# Patient Record
Sex: Female | Born: 1975
Health system: Southern US, Community
[De-identification: ages and names within clinical notes are randomized; demographics above are authoritative.]

## PROBLEM LIST (undated history)

## (undated) DIAGNOSIS — K2970 Gastritis, unspecified, without bleeding: Secondary | ICD-10-CM

## (undated) DIAGNOSIS — F32A Depression, unspecified: Secondary | ICD-10-CM

## (undated) DIAGNOSIS — R87619 Unspecified abnormal cytological findings in specimens from cervix uteri: Secondary | ICD-10-CM

## (undated) DIAGNOSIS — Z9889 Other specified postprocedural states: Secondary | ICD-10-CM

## (undated) DIAGNOSIS — I499 Cardiac arrhythmia, unspecified: Secondary | ICD-10-CM

## (undated) DIAGNOSIS — J189 Pneumonia, unspecified organism: Secondary | ICD-10-CM

## (undated) DIAGNOSIS — K219 Gastro-esophageal reflux disease without esophagitis: Secondary | ICD-10-CM

## (undated) DIAGNOSIS — T8859XA Other complications of anesthesia, initial encounter: Secondary | ICD-10-CM

## (undated) DIAGNOSIS — R112 Nausea with vomiting, unspecified: Secondary | ICD-10-CM

## (undated) DIAGNOSIS — D649 Anemia, unspecified: Secondary | ICD-10-CM

## (undated) DIAGNOSIS — F988 Other specified behavioral and emotional disorders with onset usually occurring in childhood and adolescence: Secondary | ICD-10-CM

## (undated) DIAGNOSIS — N920 Excessive and frequent menstruation with regular cycle: Secondary | ICD-10-CM

## (undated) DIAGNOSIS — R000 Tachycardia, unspecified: Secondary | ICD-10-CM

## (undated) DIAGNOSIS — F419 Anxiety disorder, unspecified: Secondary | ICD-10-CM

## (undated) DIAGNOSIS — IMO0002 Reserved for concepts with insufficient information to code with codable children: Secondary | ICD-10-CM

## (undated) HISTORY — DX: Other specified behavioral and emotional disorders with onset usually occurring in childhood and adolescence: F98.8

## (undated) HISTORY — DX: Depression, unspecified: F32.A

## (undated) HISTORY — DX: Gastritis, unspecified, without bleeding: K29.70

## (undated) HISTORY — PX: AUGMENTATION MAMMAPLASTY: SUR837

## (undated) HISTORY — DX: Excessive and frequent menstruation with regular cycle: N92.0

## (undated) HISTORY — PX: CERVICAL ABLATION: SHX5771

## (undated) HISTORY — PX: BREAST REDUCTION SURGERY: SHX8

## (undated) HISTORY — PX: OTHER SURGICAL HISTORY: SHX169

## (undated) HISTORY — PX: KNEE SURGERY: SHX244

## (undated) HISTORY — DX: Gastro-esophageal reflux disease without esophagitis: K21.9

## (undated) HISTORY — DX: Unspecified abnormal cytological findings in specimens from cervix uteri: R87.619

## (undated) HISTORY — PX: REDUCTION MAMMAPLASTY: SUR839

## (undated) HISTORY — DX: Anxiety disorder, unspecified: F41.9

## (undated) HISTORY — DX: Reserved for concepts with insufficient information to code with codable children: IMO0002

## (undated) HISTORY — DX: Tachycardia, unspecified: R00.0

## (undated) HISTORY — PX: DILATION AND CURETTAGE OF UTERUS: SHX78

## (undated) HISTORY — PX: TONSILLECTOMY AND ADENOIDECTOMY: SHX28

---

## 1999-10-08 ENCOUNTER — Other Ambulatory Visit: Admission: RE | Admit: 1999-10-08 | Discharge: 1999-10-08 | Payer: Self-pay | Admitting: *Deleted

## 2000-10-14 ENCOUNTER — Other Ambulatory Visit: Admission: RE | Admit: 2000-10-14 | Discharge: 2000-10-14 | Payer: Self-pay | Admitting: Orthopedic Surgery

## 2000-10-21 ENCOUNTER — Other Ambulatory Visit: Admission: RE | Admit: 2000-10-21 | Discharge: 2000-10-21 | Payer: Self-pay | Admitting: Obstetrics and Gynecology

## 2000-12-15 ENCOUNTER — Emergency Department (HOSPITAL_COMMUNITY): Admission: EM | Admit: 2000-12-15 | Discharge: 2000-12-15 | Payer: Self-pay | Admitting: *Deleted

## 2000-12-17 ENCOUNTER — Encounter: Payer: Self-pay | Admitting: Emergency Medicine

## 2000-12-17 ENCOUNTER — Emergency Department (HOSPITAL_COMMUNITY): Admission: EM | Admit: 2000-12-17 | Discharge: 2000-12-17 | Payer: Self-pay | Admitting: Emergency Medicine

## 2001-11-27 ENCOUNTER — Other Ambulatory Visit: Admission: RE | Admit: 2001-11-27 | Discharge: 2001-11-27 | Payer: Self-pay | Admitting: Obstetrics and Gynecology

## 2002-05-04 ENCOUNTER — Inpatient Hospital Stay (HOSPITAL_COMMUNITY): Admission: AD | Admit: 2002-05-04 | Discharge: 2002-05-04 | Payer: Self-pay | Admitting: Obstetrics and Gynecology

## 2002-06-02 ENCOUNTER — Inpatient Hospital Stay (HOSPITAL_COMMUNITY): Admission: AD | Admit: 2002-06-02 | Discharge: 2002-06-02 | Payer: Self-pay | Admitting: Obstetrics and Gynecology

## 2002-06-03 ENCOUNTER — Inpatient Hospital Stay (HOSPITAL_COMMUNITY): Admission: AD | Admit: 2002-06-03 | Discharge: 2002-06-03 | Payer: Self-pay | Admitting: Obstetrics and Gynecology

## 2002-07-02 ENCOUNTER — Inpatient Hospital Stay (HOSPITAL_COMMUNITY): Admission: AD | Admit: 2002-07-02 | Discharge: 2002-07-02 | Payer: Self-pay | Admitting: Obstetrics and Gynecology

## 2002-07-20 ENCOUNTER — Inpatient Hospital Stay (HOSPITAL_COMMUNITY): Admission: AD | Admit: 2002-07-20 | Discharge: 2002-07-20 | Payer: Self-pay | Admitting: Obstetrics and Gynecology

## 2002-07-27 ENCOUNTER — Inpatient Hospital Stay (HOSPITAL_COMMUNITY): Admission: AD | Admit: 2002-07-27 | Discharge: 2002-07-27 | Payer: Self-pay | Admitting: Obstetrics and Gynecology

## 2002-07-27 ENCOUNTER — Inpatient Hospital Stay (HOSPITAL_COMMUNITY): Admission: AD | Admit: 2002-07-27 | Discharge: 2002-07-30 | Payer: Self-pay | Admitting: Obstetrics and Gynecology

## 2002-10-16 ENCOUNTER — Encounter: Payer: Self-pay | Admitting: Urology

## 2002-10-16 ENCOUNTER — Encounter: Admission: RE | Admit: 2002-10-16 | Discharge: 2002-10-16 | Payer: Self-pay | Admitting: Urology

## 2002-12-13 ENCOUNTER — Other Ambulatory Visit: Admission: RE | Admit: 2002-12-13 | Discharge: 2002-12-13 | Payer: Self-pay | Admitting: Obstetrics and Gynecology

## 2003-12-20 ENCOUNTER — Other Ambulatory Visit: Admission: RE | Admit: 2003-12-20 | Discharge: 2003-12-20 | Payer: Self-pay | Admitting: Obstetrics and Gynecology

## 2004-03-16 ENCOUNTER — Inpatient Hospital Stay (HOSPITAL_COMMUNITY): Admission: AD | Admit: 2004-03-16 | Discharge: 2004-03-16 | Payer: Self-pay | Admitting: Obstetrics and Gynecology

## 2004-04-07 ENCOUNTER — Observation Stay (HOSPITAL_COMMUNITY): Admission: AD | Admit: 2004-04-07 | Discharge: 2004-04-08 | Payer: Self-pay | Admitting: Obstetrics and Gynecology

## 2004-08-04 ENCOUNTER — Inpatient Hospital Stay (HOSPITAL_COMMUNITY): Admission: AD | Admit: 2004-08-04 | Discharge: 2004-08-04 | Payer: Self-pay | Admitting: Obstetrics and Gynecology

## 2004-08-31 ENCOUNTER — Ambulatory Visit (HOSPITAL_COMMUNITY): Admission: RE | Admit: 2004-08-31 | Discharge: 2004-08-31 | Payer: Self-pay | Admitting: Obstetrics and Gynecology

## 2004-09-03 ENCOUNTER — Inpatient Hospital Stay (HOSPITAL_COMMUNITY): Admission: AD | Admit: 2004-09-03 | Discharge: 2004-09-04 | Payer: Self-pay | Admitting: Obstetrics and Gynecology

## 2004-09-05 ENCOUNTER — Inpatient Hospital Stay (HOSPITAL_COMMUNITY): Admission: AD | Admit: 2004-09-05 | Discharge: 2004-09-05 | Payer: Self-pay | Admitting: Obstetrics and Gynecology

## 2004-09-15 ENCOUNTER — Ambulatory Visit (HOSPITAL_COMMUNITY): Admission: RE | Admit: 2004-09-15 | Discharge: 2004-09-15 | Payer: Self-pay | Admitting: Obstetrics and Gynecology

## 2004-09-28 ENCOUNTER — Observation Stay (HOSPITAL_COMMUNITY): Admission: AD | Admit: 2004-09-28 | Discharge: 2004-09-29 | Payer: Self-pay | Admitting: Obstetrics and Gynecology

## 2004-10-02 ENCOUNTER — Inpatient Hospital Stay (HOSPITAL_COMMUNITY): Admission: AD | Admit: 2004-10-02 | Discharge: 2004-10-02 | Payer: Self-pay | Admitting: Obstetrics and Gynecology

## 2004-10-09 ENCOUNTER — Inpatient Hospital Stay (HOSPITAL_COMMUNITY): Admission: AD | Admit: 2004-10-09 | Discharge: 2004-10-11 | Payer: Self-pay | Admitting: Obstetrics and Gynecology

## 2005-02-17 ENCOUNTER — Other Ambulatory Visit: Admission: RE | Admit: 2005-02-17 | Discharge: 2005-02-17 | Payer: Self-pay | Admitting: Obstetrics and Gynecology

## 2006-03-14 ENCOUNTER — Other Ambulatory Visit: Admission: RE | Admit: 2006-03-14 | Discharge: 2006-03-14 | Payer: Self-pay | Admitting: Obstetrics and Gynecology

## 2007-05-01 ENCOUNTER — Ambulatory Visit (HOSPITAL_COMMUNITY): Admission: RE | Admit: 2007-05-01 | Discharge: 2007-05-01 | Payer: Self-pay | Admitting: Obstetrics and Gynecology

## 2007-08-04 ENCOUNTER — Inpatient Hospital Stay (HOSPITAL_COMMUNITY): Admission: AD | Admit: 2007-08-04 | Discharge: 2007-08-04 | Payer: Self-pay | Admitting: Obstetrics and Gynecology

## 2007-08-22 ENCOUNTER — Inpatient Hospital Stay (HOSPITAL_COMMUNITY): Admission: AD | Admit: 2007-08-22 | Discharge: 2007-08-23 | Payer: Self-pay | Admitting: Obstetrics and Gynecology

## 2007-09-20 ENCOUNTER — Ambulatory Visit (HOSPITAL_COMMUNITY): Admission: RE | Admit: 2007-09-20 | Discharge: 2007-09-20 | Payer: Self-pay | Admitting: Obstetrics and Gynecology

## 2007-11-19 ENCOUNTER — Inpatient Hospital Stay (HOSPITAL_COMMUNITY): Admission: AD | Admit: 2007-11-19 | Discharge: 2007-11-19 | Payer: Self-pay | Admitting: Obstetrics and Gynecology

## 2007-11-23 ENCOUNTER — Inpatient Hospital Stay (HOSPITAL_COMMUNITY): Admission: AD | Admit: 2007-11-23 | Discharge: 2007-11-23 | Payer: Self-pay | Admitting: Obstetrics and Gynecology

## 2008-01-08 ENCOUNTER — Inpatient Hospital Stay (HOSPITAL_COMMUNITY): Admission: AD | Admit: 2008-01-08 | Discharge: 2008-01-08 | Payer: Self-pay | Admitting: Obstetrics and Gynecology

## 2008-02-12 ENCOUNTER — Inpatient Hospital Stay (HOSPITAL_COMMUNITY): Admission: RE | Admit: 2008-02-12 | Discharge: 2008-02-14 | Payer: Self-pay | Admitting: Obstetrics and Gynecology

## 2008-10-02 ENCOUNTER — Ambulatory Visit: Payer: Self-pay | Admitting: Psychology

## 2008-10-03 ENCOUNTER — Ambulatory Visit: Payer: Self-pay | Admitting: Psychology

## 2008-10-07 ENCOUNTER — Ambulatory Visit: Payer: Self-pay | Admitting: Psychology

## 2008-10-14 ENCOUNTER — Ambulatory Visit: Payer: Self-pay | Admitting: Psychology

## 2008-10-23 ENCOUNTER — Ambulatory Visit: Payer: Self-pay | Admitting: Psychology

## 2008-10-30 ENCOUNTER — Ambulatory Visit: Payer: Self-pay | Admitting: Psychology

## 2008-11-08 ENCOUNTER — Ambulatory Visit: Payer: Self-pay | Admitting: Psychology

## 2008-11-26 ENCOUNTER — Ambulatory Visit: Payer: Self-pay | Admitting: Psychology

## 2009-06-24 ENCOUNTER — Encounter: Admission: RE | Admit: 2009-06-24 | Discharge: 2009-06-24 | Payer: Self-pay | Admitting: Chiropractic Medicine

## 2010-05-09 ENCOUNTER — Emergency Department (HOSPITAL_BASED_OUTPATIENT_CLINIC_OR_DEPARTMENT_OTHER): Admission: EM | Admit: 2010-05-09 | Discharge: 2010-05-09 | Payer: Self-pay | Admitting: Emergency Medicine

## 2010-11-16 ENCOUNTER — Encounter: Payer: Self-pay | Admitting: Chiropractic Medicine

## 2011-03-09 NOTE — H&P (Signed)
NAMEJAVIA, Lindsay Giles             ACCOUNT NO.:  [id number]   MEDICAL RECORD NO.:  [id number]          PATIENT TYPE:  INP   LOCATION:  9173                          [facility]ILITY:  [facility]   PHYSICIAN:  Crist Fat. Rivard, M.D. DATE OF BIRTH:  1976-07-28   DATE OF ADMISSION:  02/12/2008  DATE OF DISCHARGE:                              HISTORY & PHYSICAL   Lindsay Giles is a 35 year old gravida 4, para 2-0-1-2 at 40 weeks who  presents for induction secondary to a favorable cervix.  The cervix has  been 4 cm in the office.  The pregnancy has been remarkable for:   1. History of a subchorionic hemorrhage noted early in pregnancy with      a separation of the amnion.  This was noted until approximately 27      weeks.  It had essentially stabilized at approximately 8 cm in      diameter.  2. Rh negative with patient receiving RhoGAM during her pregnancy.  3. First child with type 1 diabetes.  4. Left fetal renal pyelectasis.  5. Early lower uterine segment development noted at 25 weeks with      patient on Procardia during her pregnancy.  6. Group B strep negative.  7. History of CHARGE disease.  8. The patient desires tubal only if a cesarean section as needed.   PRENATAL LABS:  Blood type is A negative, Rh antibody negative, VRE  nonreactive, rubella titer positive, hepatitis B surface antigen  negative, HIV was nonreactive.  GC and Chlamydia cultures were negative  in early pregnancy.  Her Pap was normal in June 2008.  Cystic fibrosis  testing was negative.  Hemoglobin upon entering the practice was 12.9.  It was within normal limits at 28 weeks.  The patient's Glucola was  normal.  Group B strep culture was negative at 36 weeks.  The patient  also had a negative fetal fibronectin during her history.   HISTORY OF PRESENT PREGNANCY:  The patient entered care at approximately  8 weeks and 5 days.  She initially was undecided regarding genetic  screening.  She did have nausea and vomiting and  some bright red  bleeding at approximately 9-10 weeks.  There was a large subchorionic  hemorrhage noted retroplacental to the inferior margin of 5.6 x 1.8 x  5.9 cm.  The patient did receive RhoGAM at that time.  That bleeding  issue did resolve.  At 13 weeks, she was having some cramping.  She had  an ultrasound for viability at approximately 13-1/2 weeks, and then had  a nuchal translucency that was within normal limits.  The retroplacental  hemorrhage at that time at 13 weeks and 3 days was 8 x 1.6 x 6 cm.  She  was continued on pelvic rest.  AST and HIV were done during her  pregnancy and were normal.  The patient had an ultrasound at [facility] at 15 weeks.  The cervix was normal and there was noted to be a  moderate subchorionic hemorrhage of 5.4 x 1.6 x 7 cm.  All other  findings were normal.  There was some separation of the amnio seen and  at 16 weeks, the clot remained at 7.42 cm.  She had a full ultrasound  for anatomy at 18 weeks with no change in the clot.  The cervix was 3.05  cm.  There was bilateral fetal renal pyelectasis noted.  She had some  cramping at 21 weeks.  She was placed on Motrin.  GC  and Chlamydia  cultures have been negative.  In December, she was started on Procardia  at approximately 22 weeks for cramping.  Ultrasound at that time showed  normal cervical length, left pyelectasis was only noted which was  borderline for gestational day.  Subchorionic hemorrhage was stable at 8  cm.  Fetal fibronectin was negative at 25 weeks.  She had not used  Procardia from her last visit, but it was recommended that she start it  that time.  There was a long and closed -2, vertex cervix with a well-  developed lower uterine segment.  Another ultrasound was done at 27  weeks showing growth at the 85th percentile.  The placental subchorionic  hemorrhage was 6.4 cm with persistent amnio separation.  Left  pyelectasis was resolved at that time.  She did have pneumonia  at that  time as well.  Fetal fibronectin was done and was negative.  At 29  weeks, she remained on Procardia.  Fetal fibronectin was negative.  The  lower uterine segment was still well-developed.  At 31 weeks, she had  another ultrasound that showed average growth, normal fluid.  The  patient at that time was discussing the possibility of a tubal; however,  she has subsequently decided this will be done if she were to have a C-  section.  Fetal fibronectin was done again at 33 weeks.  At 35 weeks,  she presented for uterine contractions.  Cervix at that time was 2, 50%,  -1.  She was sent to the MAU for further monitoring.  Group B strep  culture had been done and was negative.  HIV was also done and was  negative.  This had not been done on first visit.  Ultrasound at 37  weeks showed growth of 7 pounds 4 ounces, 75th percentile level, normal  fluid.  There was still left pyelectasis noted and cervix at that time  was 3, 50% vertex at a -1.  The patient reports that on her last exam  cervix was 4 and 80%   OBSTETRICAL HISTORY:  In 1996, with a first trimester TAB, she did  receive RhoGAM.  In 2004, she had a vaginal birth of a female infant at 4  weeks.  She was in labor 24 hours.  She had epidural anesthesia.  That  child was diagnosed with juvenile diabetes at age 8.  In 2005, she had a  vaginal birth of a female infant, weight 6 pounds 4 ounces at 37-4/7  weeks.  She was in labor 4 hours.  She had epidural anesthesia.  She did  have some preterm contractions during that pregnancy, but no cervical  change.   MEDICAL HISTORY:  In 1976, she had an abnormal Pap, but has been normal  since.  She has been a previous oral contraceptive user.   SURGICAL HISTORY:  Includes tonsillectomy and adenoidectomy in the past.  Right hand neuroma removed.  Wisdom teeth removed and the previously  noted TAB.   ALLERGIES:  ATROVENT WHICH CAUSED A NOSE BLEED.   The patient also had one  episode of  cystitis and pyelo in 1998.   FAMILY HISTORY:  Paternal grandfather had heart disease and chronic  hypertension.  Paternal grandfather also had anemia.  Paternal  grandmother had brain cancer.  Genetic history is remarkable for a niece  with CHARGE syndrome which causes developmental delay, walking problems,  hand, blind in one eye, deaf in one ear.  This had a negative genetic  evaluation.  The patient's father is also a twin.   SOCIAL HISTORY:  The patient is married to the father of the baby.  He  is involved and supportive.  His name is Wanisha Shiroma.  The patient has  some college.  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  Her husband has a Systems analyst.  He is employed at [employer].  She has  been followed by the physician [facility] at [facility].  She  denies any alcohol, drug or tobacco use during this pregnancy.   PHYSICAL EXAMINATION:  VITAL SIGNS: Stable.  The patient is febrile.  HEENT:  Within normal limits.  LUNGS:  Breath sounds are clear.  HEART:  Regular rate and rhythm without murmur.  BREASTS:  Soft and nontender.  ABDOMEN:  Fundal height is approximately 39 cm.  Estimated fetal weight  is 7 to 7-1/2 pounds.  Uterine contractions are irregular and mild.  Fetal heart rate is in the 150-160 range, although accelerations are  noted.  No decelerations were noted.  PELVIC:  Deferred at present.  EXTREMITIES:  Deep tendon reflexes are 2+ without clonus.  There is  trace edema noted.   IMPRESSION:  1. Intrauterine pregnancy at 40 weeks.  2. Favorable cervix.  3. History of persistent subchorionic hemorrhage.  4. Rh negative.  5. Left fetal renal pyelectasis.   PLAN:  1. Admit to birthing suite with consult with Dr. Silverio Lay as      attending physician.  2. Routine physician orders.  3. Patient plans private storage of cord blood and placenta  4. The patient desires artificial rupture of membranes as initial      measure for induction.  The nurse  will inform Dr. Estanislado Pandy of the      patient's choice and preference.      Renaldo Reel Emilee Hero, C.N.M.      Crist Fat Rivard, M.D.  Electronically Signed    VLL/MEDQ  D:  02/12/2008  T:  02/12/2008  Job:  [id number]

## 2011-03-12 NOTE — Discharge Summary (Signed)
[name], Lindsay Giles                       ACCOUNT NO.:  [id number]   MEDICAL RECORD NO.:  [id number]                   PATIENT TYPE:  OBV   LOCATION:  9312                                 [facility]   PHYSICIAN:  Crist Fat. Rivard, M.D.              DATE OF BIRTH:  01-27-1976   DATE OF ADMISSION:  04/07/2004  DATE OF DISCHARGE:  04/08/2004                                 DISCHARGE SUMMARY   ADMITTING DIAGNOSES:  1. Intrauterine pregnancy at [redacted] weeks gestation.  2. History hyperemesis with sinusitis and increased nausea and vomiting.   DISCHARGE DIAGNOSES:  1. Intrauterine pregnancy at [redacted] weeks gestation.  2. History hyperemesis with sinusitis and increased nausea and vomiting.  3. Nausea and vomiting improving.   Lindsay Giles is a 35 year old gravida 2 para 1-0-0-1 who presents at [redacted] weeks  gestation by LMP and early ultrasound.  She has had nausea and vomiting with  hyperemesis throughout this early pregnancy.  However, her nausea and  vomiting had been improving.  However, with occurrence of sinus infection  her nausea and vomiting returned and she was unable to keep down any food or  fluids  She is therefore admitted for 23-hour observation for IV hydration,  antiemetics, and antibiotics.  She has remained afebrile and responded well  to IV fluid hydration, antiemetics, and IV antibiotic with improvement in  nausea and vomiting.  She is therefore to be discharged home.  She is  tolerating p.o. fluids.   PLAN:  Per Dr. Estanislado Pandy:  1. Discharge home.  2. Continue Zofran and Phenergan p.r.n.  3. She is to follow up as scheduled at the office of [facility].  4. She was given a prescription Z-Pak for her sinusitis.  5. She will call the office for any further nausea or vomiting or any     problems or concerns.     Lindsay Giles, C.N.M.               Crist Fat Rivard, M.D.    SDM/MEDQ  D:  04/08/2004  T:  04/08/2004  Job:  [id number]

## 2011-03-12 NOTE — H&P (Signed)
NAME:  Lindsay Giles, APPLE                       ACCOUNT NO.:  [id number]   MEDICAL RECORD NO.:  [id number]                   PATIENT TYPE:  INP   LOCATION:  9312                                 [facility]   PHYSICIAN:  Janine Limbo, M.D.            DATE OF BIRTH:  11-Feb-1976   DATE OF ADMISSION:  04/07/2004  DATE OF DISCHARGE:                                HISTORY & PHYSICAL   HISTORY OF PRESENT ILLNESS:  The patient is a 35 year old gravida 2, para 1-  0-0-1, at 11 weeks by LMP and early ultrasound, who presents today with  recurrence of nausea and vomiting and possible sinus infection.  She has had  issues with nausea and vomiting during this pregnancy, but was improving.  However, today was exposed to her son who has a sinus infection and she  began to have symptoms of sinus pain and pressure, low grade fever, flu-like  symptoms subsequent to her exposure to him. She was on Zofran at home, but  that did not control the nausea and vomiting.  She has been unable to keep  any food or fluids down today.  She does report positive sinus pain, flu-  like symptoms, and mild sore throat.  She denies any cough or nasal  drainage, and she is having no abdominal cramping, pelvic pressure, vaginal  discharge, leaking, bleeding, or any other problems.  Pregnancy has been  remarkable for:  1) History of nausea and vomiting in this pregnancy and her  first pregnancy.  2) History of preterm labor last pregnancy, but term  delivery.   LABORATORY DATA:  Clean catch urine tonight shows specific gravity of 1.020  and 15 of ketones.   PAST OBSTETRICAL HISTORY:  The patient has had one spontaneous vaginal  delivery.  She did have preterm labor with that pregnancy, but had a term  delivery that was within the last two years.   PAST MEDICAL HISTORY:  She denies any hypertension, diabetes, or any other  problems.   PAST SURGICAL HISTORY:  Tonsils removed and surgery on her right hand in the  past.   ALLERGIES:  ATROVENT NASAL SPRAY which causes bleeding.   CURRENT MEDICATIONS:  1. Zofran which was last taken at 8 p.m. tonight.  2. Tylenol PM which was taken last night at bedtime.   SOCIAL HISTORY:  The patient is a nonsmoker. She denies any alcohol, drug,  or tobacco use during this pregnancy.  The patient is married to the father  of the baby. He is involved and supportive.  His name is Haisley Arens.  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   PHYSICAL EXAMINATION:  VITAL SIGNS:  Temperature is 99, pulse 126,  respirations 24, blood pressure 115/67.  Fetal heart tones are 170.  ABDOMEN:  Soft and nontender with an approximately 11-week fundal height.  CHEST:  Clear with bilateral breath sounds clear.  HEART:  Regular rate  and rhythm without murmur, but tachycardia is noted.  Negative CVA tenderness is noted.  HEENT:  Ears; right drum is slightly full with a small amount of yellow  exudate noted.  Left drum is clear.  Nasal turbinates are boggy and red.  There is positive upper and lower sinus tenderness.  Throat is very slightly  red. Tonsils are noted to be absent.  EXTREMITIES:  Deep tendon reflexes are 2+ without clonus. There is no edema  noted.   IMPRESSION:  1. Intrauterine pregnancy at 11 weeks.  2. Sinusitis with superimposed nausea and vomiting.   PLAN:  Admit to [facility] for 23-hour observation per consult with  Dr. Stefano Gaul.  IV hydration through the night.  Zofran IV 8 mg IV q.8h.  Ancef 1 gram IV q.8h.  Continue to observe and reevaluate in the morning.     Renaldo Reel Emilee Hero, C.N.M.                   Janine Limbo, M.D.    VLL/MEDQ  D:  04/07/2004  T:  04/07/2004  Job:  [id number]

## 2011-03-12 NOTE — H&P (Signed)
NAME:  Lindsay Giles, Lindsay Giles                ACCOUNT NO.:  [id number]   MEDICAL RECORD NO.:  [id number]                   PATIENT TYPE:  MAT   LOCATION:  MATC                                 [facility]   PHYSICIAN:  Crist Fat. Rivard, M.D.              DATE OF BIRTH:  Nov 27, 1975   DATE OF ADMISSION:  07/27/2002  DATE OF DISCHARGE:                                HISTORY & PHYSICAL   HISTORY OF PRESENT ILLNESS:  The patient is a 35 year old married white  female gravida 2 para 0-0-1-0 at [redacted] weeks gestation who presents complaining  of uterine contractions all night long every four to seven  minutes.  She  was evaluated earlier in the night and was found to be 2 cm at that time and  she did not continue to change her cervix, and went home with some Ambien.  She returned to the office later on and was found to be 2.5 cm still with  irregular but strong contractions.  She now complains of contractions that  are every four to five minutes that are increasing in intensity.  She denies  any leaking or bleeding.  She denies any nausea, vomiting, headaches, or  visual disturbances.  Her pregnancy has been followed at [facility] by the M.D. service and has been essentially uncomplicated though at  risk for:  1. Rh negative.  2. First trimester spotting.   Her group B strep is negative.   OBSTETRICAL/GYNECOLOGICAL HISTORY:  She is a gravida 2 para 0-0-1-0, had an  elective AB in 1996 with no complications.  She did receive RhoGAM with that  procedure.  She reports that she was on oral contraceptives until two years  ago.   ALLERGIES:  ATROVENT nasal spray.   GENERAL MEDICAL HISTORY:  She reports having had the usual childhood  diseases.  She has had an occasional urinary tract infection and had  pyelonephritis in 1998.  Her only surgery was tonsillectomy and right hand  surgery - had a neuroma removed, wisdom teeth, and D&C.   FAMILY HISTORY:  Significant for paternal  grandfather with congestive heart  failure, paternal grandmother with pacemaker, the same people with  hypertension, and paternal grandfather with anemia.  Her paternal  grandfather also had partial kidney failure and maternal grandmother has had  a brain tumor.   GENETIC HISTORY:  Negative.   SOCIAL HISTORY:  She is married to [employer] who is involved and  supportive.  They are both employed full-time.  They are of the  nondenominational Christian faith.  They deny any illicit drug use, alcohol,  or smoking with this pregnancy.   PRENATAL LABORATORY DATA:  Blood type A negative, antibody screen is  negative.  Syphilis nonreactive.  Rubella immune.  Hepatitis B surface  antigen negative.  HIV nonreactive.  GC and chlamydia are both negative.  Pap is within normal limits.  Her one-hour Glucola is 76.  She declined  maternal  serum alpha-fetoprotein.  Her group B strep is negative.   PHYSICAL EXAMINATION:  VITAL SIGNS:  Stable; she is afebrile.  HEENT:  Grossly within normal limits.  HEART:  Regular rhythm and rate.  CHEST:  Clear.  BREASTS:  Soft and nontender.  ABDOMEN:  Gravid with uterine contractions every four to five minutes.  Her  fetal heart rate is reactive and reassuring.  PELVIC:  Her cervix is now 3 cm, 90%, vertex, at a 0 station per Dr. Dois Davenport  Rivard.  EXTREMITIES:  Within normal limits.   ASSESSMENT:  1. Intrauterine pregnancy at term.  2. Early labor.  3. Negative group B strep.  4. Rh negative.   PLAN:  1. Admit to labor and delivery per Dr. Estanislado Pandy.  2. Follow routine M.D. orders.     Concha Pyo. Duplantis, C.N.M.              Crist Fat Rivard, M.D.    SJD/MEDQ  D:  07/27/2002  T:  07/27/2002  Job:  [id number]

## 2011-03-12 NOTE — H&P (Signed)
NAME:  Lindsay Giles, Lindsay Giles                 ACCOUNT NO.:  r   MEDICAL RECORD NO.:  [id number]          PATIENT TYPE:  INP   LOCATION:  9168                          [facility]   PHYSICIAN:  Hal Morales, M.D.DATE OF BIRTH:  11/07/1975   DATE OF ADMISSION:  09/28/2004  DATE OF DISCHARGE:                                HISTORY & PHYSICAL   HISTORY OF PRESENT ILLNESS:  Lindsay Giles is a 35 year old gravida 3, para 1-0-  1-1, at 91 weeks' gestation who was seen in the office earlier today  complaining of uterine contractions every three minutes that were increasing  in intensity and was at that point found to be 2 cm, 70%, and -1 station.  She was asked to ambulate and then be reevaluated at [facility]  subsequently.  She denies any leaking or vaginal bleeding.  She denies any  nausea, vomiting, headaches, or visual disturbances.  She rates her pain  with the contractions of 6/10 and is not currently  wanting an epidural for pain, but plans to have an epidural for pain as her  labor progresses.  Her pregnancy has been followed at [facility] by the M.D. service and has been essentially uncomplicated, though at  risk for history of Rh negative and questionable.  Her Group B st reo was  just collected today.   OB/GYN HISTORY:  Her LMP was unknown.  Her EDC was established by ultrasound  and October 26, 2004.  She is a gravida 3, para 1-0-1-1 who had an elective  AB in 1996 without complications and delivered a viable female infant in  October of 2004 who weighed 7 pounds 1 ounce at 38 weeks' gestation  following a 24 hour labor with an epidural and attended in delivery by Dr.  Dois Davenport Rivard.  She had preterm uterine contractions with her last pregnancy  but took terbutaline and Procardia and never had any cervical change.   PAST MEDICAL HISTORY:  She reports having had the usual childhood diseases.  She has a history of occasional urinary tract infections and a  single  episode of a kidney infection.   ALLERGIES:  She is allergic to ATROVENT nasal spray.  It gives her  nosebleeds.   PAST SURGICAL HISTORY:  1.  Tonsils and adenoids.  2.  Right hand surgery secondary to a neuroma.  3.  Wisdom teeth.  4.  Elective AB.   FAMILY HISTORY:  Significant for paternal grandfather with congestive heart  failure, paternal grandmother with a pacemaker, paternal grandfather with  anemia, kidney failure, and maternal grandmother with a brain tumor.  She  has a niece with some kind of disease that gives her developmental delay,  blindness in one eye, and deaf in one ear, and difficulty walking.   GENETIC HISTORY:  The patient's genetic history is negative.   SOCIAL HISTORY:  She is married to [facility], who is involved and  supportive.  She is college educated, works as a Associate Professor.  Her husband  is also college educated and they are of the [employer].  They denied  any illicit drug use, alcohol, or smoking with this pregnancy.   LABORATORY DATA:  Prenatal labs:  Her blood type is A negative, her antibody  screen is negative.  Syphilis was nonreactive.  Rubella is immune.  Hepatitis B surface antigen is negative.  HIV is negative.  GC and Chlamydia  are negative.  Pap was within normal limits.  Cystic fibrosis was negative.  Her beta strep was collected today.   PHYSICAL EXAMINATION:  VITAL SIGNS:  Stable.  She is afebrile.  HEENT:  Grossly within normal limits.  HEART:  Regular rate and rhythm.  CHEST:  Clear and breaths are nontender.  ABDOMEN:  Gravid with uterine contractions every 3-5 minutes.  Her fetal  heart rate is reactive and reassuring.  PELVIC:  Pelvic exam now is 3 cm, 80%, and vertex with a 0 station with  intact membranes and posterior.  EXTREMITIES:  Within normal limits.   ASSESSMENT:  1.  Intrauterrine pregnancy at 49 weeks' gestation.  2.  Prodromal versus early labor.  3.  Unknown group B Strep.   Dr.Haygood will  follow the patient.   PLAN:  Admit to labor and delivery at [facility] for observation and  progression of labor.  Dr. Pennie Rushing will follow the patient.     Shel   SJD/MEDQ  D:  09/28/2004  T:  09/28/2004  Job:  [id number]

## 2011-03-12 NOTE — Discharge Summary (Signed)
Lindsay Giles, Lindsay Giles             ACCOUNT NO.:  [id number]   MEDICAL RECORD NO.:  [id number]          PATIENT TYPE:  INP   LOCATION:  9168                          FACILITY:  [facility]   PHYSICIAN:  Osborn Coho, M.D.   DATE OF BIRTH:  1976/09/28   DATE OF ADMISSION:  09/28/2004  DATE OF DISCHARGE:  09/29/2004                                 DISCHARGE SUMMARY   ADMITTING DIAGNOSES:  1.  Intrauterine pregnancy at 36 weeks.  2.  Prodromal versus early labor.  3.  Unknown group B streptococcus.   DISCHARGE DIAGNOSES:  1.  Intrauterine pregnancy at 36 and one-seventh weeks.  2.  Prodromal labor.  3.  Negative group B streptococcus.   PROCEDURES:  None.   HOSPITAL COURSE:  [name] is a 35 year old gravida 3 para 1-0-1-1 at [redacted]  weeks gestation who was admitted to the hospital on September 28, 2004 with  prodromal labor versus early labor.  She had been contracting for several  days with increasing uterine contractions by the morning of September 28, 2004  with cervix 2, 70%, vertex at a -1; this had been a significant change from  previous exams.  She was ambulated and then reevaluated.  Cervix at that  time was 3 cm, 80%, vertex at a 0 station with intact membranes.  Fetal  heart rate was reactive and reassuring.  She was still contracting  approximately every 3-5 minutes.  The decision was made to admission for  observation of progression of labor.  She was observed through the night.  The patient declined any sedation.  Contractions were approximately every 5  minutes by the morning, although they had spaced out during the night some.  Cervix was 3-4, 75%, with the external ridge of the cervix 5 cm, internal  ridge of the cervix 3-4 cm, vertex, -1 to a 0 station.  Decision was made to  ambulate and reevaluate.  Upon return, her cervix was 4, 75%, vertex, and -1  station with slight bulging bag of water.  The patient at that time  continued to contract.  However, the contractions had  spaced out  significantly to approximately every 7-10 minutes.  Fetal heart rate was  reactive.  Other vital signs were stable.  Dr. Su Hilt was consulted since  the patient was a physician client.  On her reevaluation, cervix was 3-4,  75%, vertex at a -2 station.  Fetal heart rate was reactive, vital signs  were stable.  She discussed with the patient the preference not to augment  her at this time secondary to her gestational age of [redacted] weeks.  Discharge to  home was discussed with the patient.  The patient was agreeable with this  since she does live close to the hospital.  Discharge follow-up will occur  per previous scheduled appointment at the office this week.   DISCHARGE MEDICATIONS:  Prenatal vitamins one p.o. daily.   DISCHARGE INSTRUCTIONS:  The patient will continue to observe for increased  uterine contractions, leaking of fluid, vaginal bleeding, or decreased fetal  movement.  She is to follow up with [place] on an  as-needed basis and next week  in office for routine prenatal visit if undelivered.     Vick   VLL/MEDQ  D:  09/29/2004  T:  09/29/2004  Job:  [id number]

## 2011-03-12 NOTE — H&P (Signed)
NAMEMONTEEN, TOOPS             ACCOUNT NO.:  [id number]   MEDICAL RECORD NO.:  [id number]          PATIENT TYPE:  INP   LOCATION:  9165                          [facility]ILITY:  [facility]   PHYSICIAN:  Osborn Coho, M.D.   DATE OF BIRTH:  Oct 01, 1976   DATE OF ADMISSION:  10/09/2004  DATE OF DISCHARGE:                                HISTORY & PHYSICAL   HISTORY OF PRESENT ILLNESS:  This is a 35 year old gravida 3, para 1, 0-1-1  woman at 37-4/7 weeks who presents for evaluation of prodromal labor with  advanced dilation.  She denies leaking or bleeding and reports positive  fetal movement.  The pregnancy has been followed by Dr. Estanislado Pandy and more  recently by the Nurse Midwife Service and remarkable for:   1.  Unsure LMP.  2.  RH negative.   ALLERGIES:  None to medications.   OBSTETRICAL HISTORY:  Remarkable for an elective abortion in 1996 and a  vaginal delivery in 2004 of a female infant at [redacted] weeks gestation, weighing 7  pounds 1 ounce, with no complications.   MEDICAL HISTORY:  1.  History of abnormal Pap in 1996 with normal repeat Paps.  2.  History of preterm contractions with both babies.  3.  The patient is RH negative.  4.  She has a history of childhood varicella.  5.  History of pyelonephritis in 1998.   PAST SURGICAL HISTORY:  1.  T&A.  2.  Right hand surgery secondary to neuroma.  3.  Wisdom teeth extraction.  4.  Elective abortion.   FAMILY HISTORY:  1.  Grandfather with congestive heart failure.  2.  Grandmother with pacemaker.  3.  Grandfather with anemia and kidney failure.  4.  Grandmother with a brain tumor.   GENETIC HISTORY:  A niece with CHARGE disease.  The patient was told she was  not at risk for having a baby with similar problems.   SOCIAL HISTORY:  The patient is married to [facility], who is involved and  supportive.  She works in Restaurant manager, fast food.  She is of the [place] faith.  She  denies any alcohol, tobacco, or drug use.   PRENATAL  LABORATORIES:  Hemoglobin 13, platelets 238,000, blood type A-,  antibody screen negative, RPR nonreactive, Rubella immune, hepatitis  negative, HIV negative.  Pap test normal.  Gonorrhea negative.  Chlam7ydia  negative.  Cystic fibrosis negative.  Group B strep negative.  Glucola was  normal.   HISTORY OF CURRENT PREGNANCY:  The patient entered care at [redacted] weeks  gestation.  New OB labs were normal.  She had nausea, vomiting, and sinus  infection, for which she was admitted in early pregnancy.  She was placed on  Zofran, which controlled her nausea well.  She had some mild contractions at  22 weeks with a negative fetal fibronectin.  She continued to have  contractions off and on throughout the rest of the pregnancy.  She did get  another fetal fibronectin at 28 weeks, which was negative.  She received  RhoGAM at 28 weeks.  TSHs were normal.  Following an episode of  dizziness,  the TSH was done.  She had upper respiratory infection at 32 weeks, treated  with a Z-Pak, Tussionex, and Ventolin inhaler.  She continued to have some  p4reterm labor through 34 weeks.  Group B strep was negative and she  presents today.   OBJECTIVE DATA:  VITAL SIGNS:  Vital signs stable, afebrile.  HEENT:  Within normal limits.  Thyroid normal and not enlarged.  CHEST:  Clear to auscultation.  HEART:  Regular rate and rhythm.  ABDOMEN:  Gravid at 38 cm.  Vertex fully opposed.  EFM shows fetal heart  rate of 150 with accelerations to 170.  No decelerations are noted.  PELVIC:  Cervix is 4.5 cm, 80% effaced, -1 station with vertex presentation  and bulging membranes.  EXTREMITIES:  Extremities within normal limits.   ASSESSMENT:  1.  Intrauterine pregnancy at 37-4/7 weeks.  2.  Prodromal labor.  3.  Advanced dilation of cervix.   PLAN:  1.  Admit to birthing suite to Dr. Su Hilt.  2.  Routine CNM orders.  3.  Amniotomy.  4.  Epidural p.r.n.     Mari   MLW/MEDQ  D:  10/09/2004  T:  10/09/2004  Job:   [id number]

## 2011-07-15 LAB — RH IMMUNE GLOBULIN WORKUP (NOT WOMEN'S HOSP): ABO/RH(D): A NEG

## 2011-07-15 LAB — URINALYSIS, ROUTINE W REFLEX MICROSCOPIC
Bilirubin Urine: NEGATIVE
Glucose, UA: NEGATIVE
Ketones, ur: NEGATIVE
Nitrite: NEGATIVE
Protein, ur: NEGATIVE

## 2011-07-20 LAB — CBC
HCT: 28.1 — ABNORMAL LOW
Hemoglobin: 10.5 — ABNORMAL LOW
Hemoglobin: 9.9 — ABNORMAL LOW
MCHC: 34.8
Platelets: 237
Platelets: 255
RBC: 3.32 — ABNORMAL LOW
RBC: 3.59 — ABNORMAL LOW
RDW: 13.6
RDW: 13.6

## 2011-07-20 LAB — RH IMMUNE GLOB WKUP(>/=20WKS)(NOT WOMEN'S HOSP): Fetal Screen: NEGATIVE

## 2011-07-20 LAB — RPR: RPR Ser Ql: NONREACTIVE

## 2011-08-05 LAB — CBC
HCT: 37.3
Hemoglobin: 13.2
Platelets: 235
WBC: 11.1 — ABNORMAL HIGH

## 2011-08-05 LAB — DIFFERENTIAL
Basophils Absolute: 0
Basophils Relative: 0
Eosinophils Absolute: 0
Eosinophils Relative: 0
Lymphocytes Relative: 12
Lymphs Abs: 1.3
Monocytes Absolute: 0.4
Neutro Abs: 9.4 — ABNORMAL HIGH

## 2011-08-05 LAB — RH IMMUNE GLOBULIN WORKUP (NOT WOMEN'S HOSP)

## 2012-07-27 ENCOUNTER — Encounter: Payer: Self-pay | Admitting: Obstetrics and Gynecology

## 2012-07-27 ENCOUNTER — Ambulatory Visit (INDEPENDENT_AMBULATORY_CARE_PROVIDER_SITE_OTHER): Payer: 59 | Admitting: Obstetrics and Gynecology

## 2012-07-27 VITALS — BP 92/60 | Resp 16 | Ht 65.0 in | Wt 149.0 lb

## 2012-07-27 DIAGNOSIS — N920 Excessive and frequent menstruation with regular cycle: Secondary | ICD-10-CM | POA: Insufficient documentation

## 2012-07-27 DIAGNOSIS — N63 Unspecified lump in unspecified breast: Secondary | ICD-10-CM

## 2012-07-27 NOTE — Progress Notes (Signed)
   Subjective:    Lindsay Giles is a 36 y.o. female, G2P2, who presents for L lump beneath left arm pit since July. Pt states she was diagnosed with shingles in that same area July 2013 which has left her with the lump..No galactorrhea. No pain   The following portions of the patient's history were reviewed and updated as appropriate: allergies, current medications, past family history.  Review of Systems Pertinent items are noted in HPI.     Objective:    BP 92/60  Resp 16  Ht 5\' 5"  (1.651 m)  Wt 149 lb (67.586 kg)  BMI 24.79 kg/m2  LMP 07/20/2012    Weight:  Wt Readings from Last 1 Encounters:  07/27/12 149 lb (67.586 kg)          BMI: Body mass index is 24.79 kg/(m^2).  General Appearance: Alert, appropriate appearance for age. No acute distress Breast: normal bilateral but dense. Left 0.5 cm axillary node, soft, non-tender   Assessment:    Left axillary node probably residual from shingles    Plan:    Diagnostic left breast and underarm ultrasound

## 2012-07-28 ENCOUNTER — Other Ambulatory Visit: Payer: Self-pay | Admitting: Obstetrics and Gynecology

## 2012-07-28 ENCOUNTER — Ambulatory Visit
Admission: RE | Admit: 2012-07-28 | Discharge: 2012-07-28 | Disposition: A | Discharge status: Home / Self Care | Payer: 59 | Source: Ambulatory Visit | Attending: Obstetrics and Gynecology | Admitting: Obstetrics and Gynecology

## 2012-07-28 DIAGNOSIS — N63 Unspecified lump in unspecified breast: Secondary | ICD-10-CM

## 2012-08-29 ENCOUNTER — Ambulatory Visit (INDEPENDENT_AMBULATORY_CARE_PROVIDER_SITE_OTHER): Payer: 59 | Admitting: Psychology

## 2012-08-29 DIAGNOSIS — F411 Generalized anxiety disorder: Secondary | ICD-10-CM

## 2012-09-07 ENCOUNTER — Ambulatory Visit (INDEPENDENT_AMBULATORY_CARE_PROVIDER_SITE_OTHER): Payer: 59 | Admitting: "Endocrinology

## 2012-09-07 DIAGNOSIS — Z23 Encounter for immunization: Secondary | ICD-10-CM

## 2012-09-07 MED ORDER — INFLUENZA VIRUS VACC SPLIT PF IM SUSP: 0.5000 mL | Freq: Once | INTRAMUSCULAR | Status: DC

## 2012-09-07 NOTE — Progress Notes (Signed)
Flu shot

## 2012-09-11 ENCOUNTER — Ambulatory Visit (INDEPENDENT_AMBULATORY_CARE_PROVIDER_SITE_OTHER): Payer: 59 | Admitting: Psychology

## 2012-09-11 DIAGNOSIS — F411 Generalized anxiety disorder: Secondary | ICD-10-CM

## 2012-09-12 ENCOUNTER — Ambulatory Visit: Payer: 59 | Admitting: Psychology

## 2012-09-26 ENCOUNTER — Ambulatory Visit (INDEPENDENT_AMBULATORY_CARE_PROVIDER_SITE_OTHER): Payer: 59 | Admitting: Psychology

## 2012-09-26 DIAGNOSIS — F411 Generalized anxiety disorder: Secondary | ICD-10-CM

## 2012-09-26 DIAGNOSIS — Z630 Problems in relationship with spouse or partner: Secondary | ICD-10-CM

## 2012-09-27 ENCOUNTER — Ambulatory Visit (INDEPENDENT_AMBULATORY_CARE_PROVIDER_SITE_OTHER): Payer: 59 | Admitting: Psychology

## 2012-09-27 DIAGNOSIS — F411 Generalized anxiety disorder: Secondary | ICD-10-CM

## 2012-10-05 ENCOUNTER — Ambulatory Visit (INDEPENDENT_AMBULATORY_CARE_PROVIDER_SITE_OTHER): Payer: 59 | Admitting: Psychology

## 2012-10-05 DIAGNOSIS — F411 Generalized anxiety disorder: Secondary | ICD-10-CM

## 2012-10-10 ENCOUNTER — Ambulatory Visit (INDEPENDENT_AMBULATORY_CARE_PROVIDER_SITE_OTHER): Payer: 59 | Admitting: Psychology

## 2012-10-10 DIAGNOSIS — F411 Generalized anxiety disorder: Secondary | ICD-10-CM

## 2012-10-12 ENCOUNTER — Ambulatory Visit: Payer: Self-pay | Admitting: Psychology

## 2012-10-19 ENCOUNTER — Ambulatory Visit: Payer: Self-pay | Admitting: Psychology

## 2012-10-26 ENCOUNTER — Ambulatory Visit (INDEPENDENT_AMBULATORY_CARE_PROVIDER_SITE_OTHER): Payer: 59 | Admitting: Psychology

## 2012-10-26 DIAGNOSIS — F411 Generalized anxiety disorder: Secondary | ICD-10-CM

## 2012-11-09 ENCOUNTER — Ambulatory Visit: Payer: 59 | Admitting: Psychology

## 2012-11-14 ENCOUNTER — Ambulatory Visit (INDEPENDENT_AMBULATORY_CARE_PROVIDER_SITE_OTHER): Payer: 59 | Admitting: Psychology

## 2012-11-14 DIAGNOSIS — F411 Generalized anxiety disorder: Secondary | ICD-10-CM

## 2012-11-14 DIAGNOSIS — Z630 Problems in relationship with spouse or partner: Secondary | ICD-10-CM

## 2012-11-21 ENCOUNTER — Ambulatory Visit (INDEPENDENT_AMBULATORY_CARE_PROVIDER_SITE_OTHER): Payer: 59 | Admitting: Psychology

## 2012-11-21 DIAGNOSIS — F411 Generalized anxiety disorder: Secondary | ICD-10-CM

## 2012-12-14 ENCOUNTER — Ambulatory Visit: Payer: 59 | Admitting: Psychology

## 2012-12-15 ENCOUNTER — Ambulatory Visit: Payer: 59 | Admitting: Psychology

## 2012-12-19 ENCOUNTER — Ambulatory Visit: Payer: 59 | Admitting: Psychology

## 2013-01-31 ENCOUNTER — Ambulatory Visit (INDEPENDENT_AMBULATORY_CARE_PROVIDER_SITE_OTHER): Payer: 59 | Admitting: Psychology

## 2013-01-31 DIAGNOSIS — F411 Generalized anxiety disorder: Secondary | ICD-10-CM

## 2013-01-31 DIAGNOSIS — Z630 Problems in relationship with spouse or partner: Secondary | ICD-10-CM

## 2013-02-13 ENCOUNTER — Ambulatory Visit: Payer: BC Managed Care – PPO | Admitting: Psychology

## 2013-02-15 ENCOUNTER — Ambulatory Visit (INDEPENDENT_AMBULATORY_CARE_PROVIDER_SITE_OTHER): Payer: 59 | Admitting: Psychology

## 2013-02-15 DIAGNOSIS — F411 Generalized anxiety disorder: Secondary | ICD-10-CM

## 2013-02-19 ENCOUNTER — Ambulatory Visit (INDEPENDENT_AMBULATORY_CARE_PROVIDER_SITE_OTHER): Payer: 59 | Admitting: Psychology

## 2013-02-19 DIAGNOSIS — F411 Generalized anxiety disorder: Secondary | ICD-10-CM

## 2013-02-19 DIAGNOSIS — Z7189 Other specified counseling: Secondary | ICD-10-CM

## 2013-03-01 ENCOUNTER — Ambulatory Visit (INDEPENDENT_AMBULATORY_CARE_PROVIDER_SITE_OTHER): Payer: 59 | Admitting: Psychology

## 2013-03-01 DIAGNOSIS — F411 Generalized anxiety disorder: Secondary | ICD-10-CM

## 2013-03-08 ENCOUNTER — Ambulatory Visit (INDEPENDENT_AMBULATORY_CARE_PROVIDER_SITE_OTHER): Payer: 59 | Admitting: Psychology

## 2013-03-08 DIAGNOSIS — F411 Generalized anxiety disorder: Secondary | ICD-10-CM

## 2013-03-15 ENCOUNTER — Ambulatory Visit: Payer: 59 | Admitting: Psychology

## 2013-04-03 ENCOUNTER — Ambulatory Visit (INDEPENDENT_AMBULATORY_CARE_PROVIDER_SITE_OTHER): Payer: 59 | Admitting: Psychology

## 2013-04-03 DIAGNOSIS — F411 Generalized anxiety disorder: Secondary | ICD-10-CM

## 2013-04-06 ENCOUNTER — Ambulatory Visit (INDEPENDENT_AMBULATORY_CARE_PROVIDER_SITE_OTHER): Payer: 59 | Admitting: Psychology

## 2013-04-06 DIAGNOSIS — F411 Generalized anxiety disorder: Secondary | ICD-10-CM

## 2013-04-06 DIAGNOSIS — Z630 Problems in relationship with spouse or partner: Secondary | ICD-10-CM

## 2013-04-09 ENCOUNTER — Ambulatory Visit (INDEPENDENT_AMBULATORY_CARE_PROVIDER_SITE_OTHER): Payer: 59 | Admitting: Psychology

## 2013-04-09 DIAGNOSIS — F411 Generalized anxiety disorder: Secondary | ICD-10-CM

## 2013-04-10 ENCOUNTER — Ambulatory Visit (INDEPENDENT_AMBULATORY_CARE_PROVIDER_SITE_OTHER): Payer: 59 | Admitting: Psychology

## 2013-04-10 DIAGNOSIS — F411 Generalized anxiety disorder: Secondary | ICD-10-CM

## 2013-04-23 ENCOUNTER — Ambulatory Visit (INDEPENDENT_AMBULATORY_CARE_PROVIDER_SITE_OTHER): Payer: BC Managed Care – PPO | Admitting: Psychology

## 2013-04-23 DIAGNOSIS — F411 Generalized anxiety disorder: Secondary | ICD-10-CM

## 2013-05-11 ENCOUNTER — Ambulatory Visit (INDEPENDENT_AMBULATORY_CARE_PROVIDER_SITE_OTHER): Payer: BC Managed Care – PPO | Admitting: Psychology

## 2013-05-11 DIAGNOSIS — F411 Generalized anxiety disorder: Secondary | ICD-10-CM

## 2013-06-01 ENCOUNTER — Ambulatory Visit (INDEPENDENT_AMBULATORY_CARE_PROVIDER_SITE_OTHER): Payer: BC Managed Care – PPO | Admitting: Psychology

## 2013-06-01 DIAGNOSIS — F411 Generalized anxiety disorder: Secondary | ICD-10-CM

## 2013-06-06 ENCOUNTER — Ambulatory Visit: Payer: BC Managed Care – PPO | Admitting: Psychology

## 2013-06-11 ENCOUNTER — Ambulatory Visit (INDEPENDENT_AMBULATORY_CARE_PROVIDER_SITE_OTHER): Payer: BC Managed Care – PPO | Admitting: Psychology

## 2013-06-11 DIAGNOSIS — F411 Generalized anxiety disorder: Secondary | ICD-10-CM

## 2013-06-13 ENCOUNTER — Ambulatory Visit: Payer: BC Managed Care – PPO | Admitting: Psychology

## 2013-06-14 ENCOUNTER — Ambulatory Visit: Payer: BC Managed Care – PPO | Admitting: Psychology

## 2013-06-27 ENCOUNTER — Ambulatory Visit: Payer: Self-pay | Admitting: Psychology

## 2013-07-12 ENCOUNTER — Ambulatory Visit (INDEPENDENT_AMBULATORY_CARE_PROVIDER_SITE_OTHER): Payer: BC Managed Care – PPO | Admitting: Psychology

## 2013-07-12 DIAGNOSIS — F411 Generalized anxiety disorder: Secondary | ICD-10-CM

## 2013-07-16 ENCOUNTER — Ambulatory Visit (INDEPENDENT_AMBULATORY_CARE_PROVIDER_SITE_OTHER): Payer: BC Managed Care – PPO | Admitting: Psychology

## 2013-07-16 DIAGNOSIS — F411 Generalized anxiety disorder: Secondary | ICD-10-CM

## 2013-08-23 ENCOUNTER — Ambulatory Visit (INDEPENDENT_AMBULATORY_CARE_PROVIDER_SITE_OTHER): Payer: BC Managed Care – PPO | Admitting: Psychology

## 2013-08-23 DIAGNOSIS — F411 Generalized anxiety disorder: Secondary | ICD-10-CM

## 2013-10-11 ENCOUNTER — Ambulatory Visit: Payer: Self-pay

## 2013-10-11 ENCOUNTER — Ambulatory Visit (INDEPENDENT_AMBULATORY_CARE_PROVIDER_SITE_OTHER): Payer: BC Managed Care – PPO | Admitting: Family Medicine

## 2013-10-11 VITALS — BP 118/68 | HR 82 | Temp 98.7°F | Resp 16 | Ht 65.0 in | Wt 155.0 lb

## 2013-10-11 DIAGNOSIS — R519 Headache, unspecified: Secondary | ICD-10-CM

## 2013-10-11 DIAGNOSIS — R110 Nausea: Secondary | ICD-10-CM

## 2013-10-11 DIAGNOSIS — R51 Headache: Secondary | ICD-10-CM

## 2013-10-11 LAB — POCT CBC
Granulocyte percent: 65.4 %G (ref 37–80)
HCT, POC: 39.4 % (ref 37.7–47.9)
Hemoglobin: 12.6 g/dL (ref 12.2–16.2)
Lymph, poc: 2.1 (ref 0.6–3.4)
MCH, POC: 29.8 pg (ref 27–31.2)
MCHC: 32 g/dL (ref 31.8–35.4)
MCV: 93.2 fL (ref 80–97)
MID (cbc): 0.4 (ref 0–0.9)
MPV: 8.9 fL (ref 0–99.8)
POC Granulocyte: 4.6 (ref 2–6.9)
POC LYMPH PERCENT: 29.4 %L (ref 10–50)
POC MID %: 5.2 %M (ref 0–12)
Platelet Count, POC: 236 10*3/uL (ref 142–424)
RBC: 4.23 M/uL (ref 4.04–5.48)
RDW, POC: 12.5 %
WBC: 7 10*3/uL (ref 4.6–10.2)

## 2013-10-11 MED ORDER — KETOROLAC TROMETHAMINE 60 MG/2ML IM SOLN
60.0000 mg | Freq: Once | INTRAMUSCULAR | Status: AC
  Administered 2013-10-11: 60 mg via INTRAMUSCULAR

## 2013-10-11 MED ORDER — ONDANSETRON 4 MG PO TBDP
8.0000 mg | ORAL_TABLET | Freq: Once | ORAL | Status: AC
  Administered 2013-10-11: 8 mg via ORAL

## 2013-10-11 MED ORDER — HYDROCODONE-ACETAMINOPHEN 5-325 MG PO TABS: 1.0000 | ORAL_TABLET | Freq: Four times a day (QID) | ORAL | Status: DC | PRN

## 2013-10-11 MED ORDER — PROMETHAZINE HCL 25 MG PO TABS: 25.0000 mg | ORAL_TABLET | Freq: Three times a day (TID) | ORAL | Status: DC | PRN

## 2013-10-11 MED ORDER — IBUPROFEN 800 MG PO TABS: 800.0000 mg | ORAL_TABLET | Freq: Three times a day (TID) | ORAL | Status: DC | PRN

## 2013-10-11 NOTE — Progress Notes (Signed)
Subjective:    Patient ID: Lindsay Giles, female    DOB: 22-Apr-1976, 37 y.o.   MRN: [id number]  HPI 37 year old female presents for evaluation of 5 day history of left sided facial pain, headache, nausea, and photophobia. Also complains of 1 episode of blurred vision today.  She has taken OTC migraine medication that has "taken the edge off." She does have a history of migraines in the past, about 9 years ago. Also has hx of sinus infections that feel similar to this although they are usually accompanied by nasal congestion and other URI symptoms. Admits that she does have photophobia and nausea but has only had one episode of emesis on the first day.  Denies dizziness, fever, chills, abdominal pain, sore throat, double vision or any other vision changes.  Has had decreased appetite today due to nausea. Her pain is 5/10 today.  Patient is otherwise doing well with no other concerns today.     Review of Systems  Constitutional: Positive for appetite change (decreased appetite due to nausea). Negative for fever and chills.  HENT: Negative for congestion, ear pain, postnasal drip, rhinorrhea and sore throat.   Eyes: Positive for photophobia and visual disturbance (1 episode of blurry vision today). Negative for pain and discharge.  Respiratory: Negative for cough.   Gastrointestinal: Positive for nausea. Negative for vomiting and abdominal pain.  Neurological: Positive for headaches. Negative for dizziness.       Objective:   Physical Exam  Constitutional: She is oriented to person, place, and time. She appears well-developed and well-nourished.  HENT:  Head: Normocephalic and atraumatic.  Right Ear: Hearing, tympanic membrane, external ear and ear canal normal.  Left Ear: Hearing, tympanic membrane, external ear and ear canal normal.  Nose: Right sinus exhibits no maxillary sinus tenderness and no frontal sinus tenderness. Left sinus exhibits no maxillary sinus tenderness and no frontal  sinus tenderness.  Mouth/Throat: Uvula is midline, oropharynx is clear and moist and mucous membranes are normal.  Eyes: Conjunctivae, EOM and lids are normal. Pupils are equal, round, and reactive to light.  Neck: Normal range of motion. Neck supple.  Cardiovascular: Normal rate, regular rhythm and normal heart sounds.   Pulmonary/Chest: Effort normal and breath sounds normal.  Lymphadenopathy:    She has no cervical adenopathy.  Neurological: She is alert and oriented to person, place, and time.  Psychiatric: She has a normal mood and affect. Her behavior is normal. Judgment and thought content normal.   Zofran 8 mg ODT given - patient reported improvement in nausea and headache.   [facility] reading (PRIMARY) by  Dr. Katrinka Blazing as no acute bony abnormality.     Assessment & Plan:  Facial pain - Plan: DG Sinuses Complete, POCT CBC  Nausea alone - Plan: ondansetron (ZOFRAN-ODT) disintegrating tablet 8 mg, promethazine (PHENERGAN) 25 MG tablet  Headache(784.0) - Plan: ketorolac (TORADOL) injection 60 mg, ibuprofen (ADVIL,MOTRIN) 800 MG tablet, HYDROcodone-acetaminophen (NORCO) 5-325 MG per tablet  1. Facial pain:  New.  Associated with nausea, headache, photophobia. Ddx includes migraine, sinusitis, trigeminal neuralgia, early Herpes Zoster. Reviewed ddx with patient in detail. S/p Toradol in office tonight with Zofran 8mg  ODT.  RTC in upcoming 24-48 hours if no improvement and sooner if worsens. Normal neurological exam in office. 2.  Nausea:  New; associated with headache, photophobia; s/p Zofran in office.   3.  Headache: New.  Associated with above; treat as above; rx for Ibuprofen 800mg  and Hydrocodone provided.  RTC for persistence  or acute worsening.  Toradol 60 mg IM today.  Phenergan 50 mg po tonight May take ibuprofen 800 mg tid with food tomorrow if headache continues. Norco only for severe pain Return or go to ER if symptoms acutely worsening or fail to improve.   Nilda Simmer,  M.D.  [facility] [address] [place], [place]  [address] [phone] phone [phone] fax

## 2013-10-11 NOTE — Patient Instructions (Signed)
Take promethazine 50 mg (2 tablets) tonight If headache still present tomorrow but improved, take ibuprofen 800 mg with food. May use hydrocodone only if severe pain! If significantly worse tomorrow or through the night need to return or go to ER.     Migraine Headache A migraine headache is an intense, throbbing pain on one or both sides of your head. A migraine can last for 30 minutes to several hours. CAUSES  The exact cause of a migraine headache is not always known. However, a migraine may be caused when nerves in the brain become irritated and release chemicals that cause inflammation. This causes pain. SYMPTOMS  Pain on one or both sides of your head.  Pulsating or throbbing pain.  Severe pain that prevents daily activities.  Pain that is aggravated by any physical activity.  Nausea, vomiting, or both.  Dizziness.  Pain with exposure to bright lights, loud noises, or activity.  General sensitivity to bright lights, loud noises, or smells. Before you get a migraine, you may get warning signs that a migraine is coming (aura). An aura may include:  Seeing flashing lights.  Seeing bright spots, halos, or zig-zag lines.  Having tunnel vision or blurred vision.  Having feelings of numbness or tingling.  Having trouble talking.  Having muscle weakness. MIGRAINE TRIGGERS  Alcohol.  Smoking.  Stress.  Menstruation.  Aged cheeses.  Foods or drinks that contain nitrates, glutamate, aspartame, or tyramine.  Lack of sleep.  Chocolate.  Caffeine.  Hunger.  Physical exertion.  Fatigue.  Medicines used to treat chest pain (nitroglycerine), birth control pills, estrogen, and some blood pressure medicines. DIAGNOSIS  A migraine headache is often diagnosed based on:  Symptoms.  Physical examination.  A CT scan or MRI of your head. TREATMENT Medicines may be given for pain and nausea. Medicines can also be given to help prevent recurrent migraines.    HOME CARE INSTRUCTIONS  Only take over-the-counter or prescription medicines for pain or discomfort as directed by your caregiver. The use of long-term narcotics is not recommended.  Lie down in a dark, quiet room when you have a migraine.  Keep a journal to find out what may trigger your migraine headaches. For example, write down:  What you eat and drink.  How much sleep you get.  Any change to your diet or medicines.  Limit alcohol consumption.  Quit smoking if you smoke.  Get 7 to 9 hours of sleep, or as recommended by your caregiver.  Limit stress.  Keep lights dim if bright lights bother you and make your migraines worse. SEEK IMMEDIATE MEDICAL CARE IF:   Your migraine becomes severe.  You have a fever.  You have a stiff neck.  You have vision loss.  You have muscular weakness or loss of muscle control.  You start losing your balance or have trouble walking.  You feel faint or pass out.  You have severe symptoms that are different from your first symptoms. MAKE SURE YOU:   Understand these instructions.  Will watch your condition.  Will get help right away if you are not doing well or get worse. Document Released: 10/11/2005 Document Revised: 01/03/2012 Document Reviewed: 10/01/2011 [facility] Patient Information 2014 [place], [place].

## 2013-11-15 ENCOUNTER — Other Ambulatory Visit: Payer: Self-pay | Admitting: Neurology

## 2013-11-15 DIAGNOSIS — R51 Headache: Principal | ICD-10-CM

## 2013-11-15 DIAGNOSIS — R519 Headache, unspecified: Secondary | ICD-10-CM

## 2013-11-16 ENCOUNTER — Ambulatory Visit
Admission: RE | Admit: 2013-11-16 | Discharge: 2013-11-16 | Disposition: A | Discharge status: Home / Self Care | Payer: BC Managed Care – PPO | Source: Ambulatory Visit | Attending: Neurology | Admitting: Neurology

## 2013-11-16 DIAGNOSIS — R519 Headache, unspecified: Secondary | ICD-10-CM

## 2013-11-16 DIAGNOSIS — R51 Headache: Principal | ICD-10-CM

## 2014-02-15 ENCOUNTER — Ambulatory Visit (INDEPENDENT_AMBULATORY_CARE_PROVIDER_SITE_OTHER): Payer: BC Managed Care – PPO | Admitting: Surgery

## 2014-02-15 ENCOUNTER — Encounter (INDEPENDENT_AMBULATORY_CARE_PROVIDER_SITE_OTHER): Payer: Self-pay | Admitting: Surgery

## 2014-02-15 VITALS — BP 130/70 | HR 76 | Temp 97.8°F | Resp 14 | Ht 66.0 in | Wt 140.6 lb

## 2014-02-15 DIAGNOSIS — K6289 Other specified diseases of anus and rectum: Secondary | ICD-10-CM

## 2014-02-15 MED ORDER — CIPROFLOXACIN HCL 500 MG PO TABS: 500.0000 mg | ORAL_TABLET | Freq: Two times a day (BID) | ORAL | Status: DC

## 2014-02-15 NOTE — Progress Notes (Addendum)
Subjective:     Patient ID: Lindsay Giles, female   DOB: 1976/06/06, 38 y.o.   MRN: [id number]  HPI  Note: This dictation was prepared with Dragon/digital dictation along with [facility] technology. Any transcriptional errors that result from this process are unintentional.       KIRAH STICE  Nov 23, 1975 [id number]  Patient Care Team: Haywood Pao, MD as PCP - General (Internal Medicine) Winfield Cunas., MD as Consulting Physician (Gastroenterology)  This patient is a 38 y.o.female who presents today for surgical evaluation at the request of Dr. Oletta Lamas.   Reason for visit: Rectal pain  Pleasant white female.  She comes today with her father.  She has had some rectal pain.  It started earlier in the week.  No bleeding.  No drainage.  She does struggle with constipation.  Usually has a bowel movement every 5 days.  Has been followed by gastroenterology.  Occasionally uses MiraLAX.  She is seen by someone in [place].  Switched to a gluten/dairy free diet & also on natural supplements for the past 2 weeks.  No major change in her bowels.  She had a history of a hemorrhoid that was probably thrombosed that spontaneously drained 2 years ago.  No anorectal interventions.    No history of fistula or fissure.  No personal nor family history of GI/colon cancer, inflammatory bowel disease, irritable bowel syndrome, allergy such as Celiac Sprue, dietary/dairy problems, colitis, ulcers nor gastritis.  No recent sick contacts/gastroenteritis.  No travel outside the country.  No changes in diet.  No dysphagia to solids or liquids.  No significant heartburn or reflux.  No hematochezia, hematemesis, coffee ground emesis.  No evidence of prior gastric/peptic ulceration.  She saw gastroenterology yesterday.   Dr Oletta Lamas was concerned of a mild rectal fullness.  Surgical consultation requested yesterday afternoon.  We fit her today.  She is anxious/nervous but consolable.  Denies rectal  drainage or bleeding.  Never had anything like this before.   Patient Active Problem List   Diagnosis Date Noted  . Rectal pain 02/15/2014  . Menorrhagia     Past Medical History  Diagnosis Date  . Abnormal Pap smear   . Menorrhagia     Past Surgical History  Procedure Laterality Date  . Tonsillectomy and adenoidectomy    . Dilation and curettage of uterus      History   Social History  . Marital Status: Married    Spouse Name: N/A    Number of Children: N/A  . Years of Education: N/A   Occupational History  . Not on file.   Social History Main Topics  . Smoking status: Never Smoker   . Smokeless tobacco: Never Used  . Alcohol Use: 0.5 oz/week    1 drink(s) per week  . Drug Use: No  . Sexual Activity: Yes    Partners: Male    Birth Control/ Protection: Surgical     Comment: vas    Other Topics Concern  . Not on file   Social History Narrative   Daughter of Dr. 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 Breast cancer Paternal Grandmother   . Cancer Paternal Grandmother 66    breast  . Hypothyroidism Mother     Current Outpatient Prescriptions  Medication Sig Dispense Refill  . cholecalciferol (VITAMIN D) 1000 UNITS tablet Take 1,000 Units by mouth daily.      Marland Kitchen  Melatonin 3 MG CAPS Take by mouth.      . Topiramate (TOPAMAX PO) Take by mouth.      . ciprofloxacin (CIPRO) 500 MG tablet Take 1 tablet (500 mg total) by mouth 2 (two) times daily.  20 tablet  2  . desvenlafaxine (PRISTIQ) 100 MG 24 hr tablet Take 100 mg by mouth daily.      Marland Kitchen HYDROcodone-acetaminophen (NORCO) 5-325 MG per tablet Take 1 tablet by mouth every 6 (six) hours as needed for moderate pain.  30 tablet  0  . ibuprofen (ADVIL,MOTRIN) 800 MG tablet Take 1 tablet (800 mg total) by mouth every 8 (eight) hours as needed.  30 tablet  0  . promethazine (PHENERGAN) 25 MG tablet Take 1 tablet (25 mg  total) by mouth every 8 (eight) hours as needed for nausea or vomiting.  20 tablet  0   No current facility-administered medications for this visit.     Allergies  Allergen Reactions  . Atrovent [Ipratropium] Other (See Comments)    Uncontrollable nosebleeds     BP 130/70  Pulse 76  Temp(Src) 97.8 F (36.6 C)  Resp 14  Ht 5\' 6"  (1.676 m)  Wt 140 lb 9.6 oz (63.776 kg)  BMI 22.70 kg/m2  No results found.   Review of Systems  Constitutional: Negative for fever, chills, diaphoresis, appetite change and fatigue.  HENT: Negative for ear discharge, ear pain, sore throat and trouble swallowing.   Eyes: Negative for photophobia, discharge and visual disturbance.  Respiratory: Negative for cough, choking, chest tightness and shortness of breath.   Cardiovascular: Negative for chest pain and palpitations.  Gastrointestinal: Negative for nausea, vomiting, abdominal pain, diarrhea, constipation, anal bleeding and rectal pain.  Endocrine: Negative for cold intolerance and heat intolerance.  Genitourinary: Negative for dysuria, frequency and difficulty urinating.  Musculoskeletal: Negative for gait problem, myalgias and neck pain.  Skin: Negative for color change, pallor and rash.  Allergic/Immunologic: Negative for environmental allergies, food allergies and immunocompromised state.  Neurological: Negative for dizziness, speech difficulty, weakness and numbness.  Hematological: Negative for adenopathy.  Psychiatric/Behavioral: Negative for confusion and agitation. The patient is not nervous/anxious.        Objective:   Physical Exam  Constitutional: She is oriented to person, place, and time. She appears well-developed and well-nourished. No distress.  HENT:  Head: Normocephalic.  Mouth/Throat: Oropharynx is clear and moist. No oropharyngeal exudate.  Eyes: Conjunctivae and EOM are normal. Pupils are equal, round, and reactive to light. No scleral icterus.  Neck: Normal range of  motion. Neck supple. No tracheal deviation present.  Cardiovascular: Normal rate, regular rhythm and intact distal pulses.   Pulmonary/Chest: Effort normal and breath sounds normal. No stridor. No respiratory distress. She exhibits no tenderness.  Abdominal: Soft. She exhibits no distension and no mass. There is no tenderness. Hernia confirmed negative in the right inguinal area and confirmed negative in the left inguinal area.  Genitourinary: No vaginal discharge found.  Exam done with assistance of female Medical Assistant in the room.  Perianal skin clean with good hygiene.  No pruritis.  No pilonidal disease.  No fissure.  No abscess/fistula.  Normal sphincter tone.  Tolerates digital  and anoscopic rectal exam easily.  No rectal masses.  Hemorrhoidal piles WNL  Mild left lateral rectal wall tenderness/fullness - no discrete mass.  External hemorrhoids mild R ant / L postlat - not irritated.    Musculoskeletal: Normal range of motion. She exhibits no tenderness.  Right elbow: She exhibits normal range of motion.       Left elbow: She exhibits normal range of motion.       Right wrist: She exhibits normal range of motion.       Left wrist: She exhibits normal range of motion.       Right hand: Normal strength noted.       Left hand: Normal strength noted.  Lymphadenopathy:       Head (right side): No posterior auricular adenopathy present.       Head (left side): No posterior auricular adenopathy present.    She has no cervical adenopathy.    She has no axillary adenopathy.       Right: No inguinal adenopathy present.       Left: No inguinal adenopathy present.  Neurological: She is alert and oriented to person, place, and time. No cranial nerve deficit. She exhibits normal muscle tone. Coordination normal.  Skin: Skin is warm and dry. No rash noted. She is not diaphoretic. No erythema.  Psychiatric: She has a normal mood and affect. Her behavior is normal. Judgment and thought  content normal.       Assessment:     Rectal pain of uncertain etiology.  Mild rectal fullness and mild external hemorrhoids.  ?Proctitis    Plan:     I do not sense any evidence of fissure or fistula.  There is no discrete abscess to drain.  She says she feels a little bit better from yesterday.  I would add Ciprofloxacin to the Flagyl for a 10 day course.  That should help things calm down just to be sure.  If she does not improve or worsens, CT scan of the pelvis to rule out deeper pelvic abscess that may not be easy to detect on physical examination.  I offered to examine her in a week or so vs. Just calling me if she needs me.  She felt comfortable with just calling if needed.  I am skeptical there is anything that severe as she should have much more intense pain.  I strongly recommend she improved her constipation.  Increase MiraLAX or other fiber supplement.  Goal is to have one soft bowel movement a day.  Most anorectal problems are side effects of bowel dysfunction.  If she can correct her constipation, it will help minimize anorectal problems.  Mild ext hemorrhoids.  They do not seem to be particularly irritating right now.  Did discuss this with her on general management:   The anatomy & physiology of the anorectal region was discussed.  The pathophysiology of hemorrhoids and differential diagnosis was discussed.  Natural history progression  was discussed.   I stressed the importance of a bowel regimen to have daily soft bowel movements to minimize progression of disease.   Goal of one BM / day ideal.  Use of wet wipes, warm baths, avoiding straining, etc were emphasized.  Educational handouts further explaining the pathology, treatment options, and bowel regimen were given as well.   The patient & father expressed understanding & appreciation.

## 2014-02-15 NOTE — Patient Instructions (Signed)
Please consider the recommendations that we have given you today:  Continue Flagyl/metronidazole antibiotic.  At ciprofloxacin as well.  Take both antibiotics for 10 days.  Improve your constipation.  That should help decrease the chance of having further problems.  If you are not better or worse and, you probably will benefit from a CAT scan to rule out a deeper missed abscess.  Please call if you are not getting better  See the Handout(s) we have given you.  Please call our office at [phone] if you wish to schedule surgery or if you have further questions / concerns.    ANORECTAL PAIN: INSTRUCTIONS  1. Take your usually prescribed home medications unless otherwise directed. 2. DIET: Follow a light bland diet the first 24 hours after arrival home, such as soup, liquids, crackers, etc.  Be sure to include lots of fluids daily.  Avoid fast food or heavy meals as your are more likely to get nauseated.  Eat a low fat the next few days after surgery.   3. PAIN CONTROL: a. Pain is best controlled by a usual combination of three different methods TOGETHER: i. Ice/Heat ii. Over the counter pain medication iii. Prescription pain medication b. Most patients will experience some swelling and discomfort in the anus/rectal area. and incisions.  Ice packs or heat (30-60 minutes up to 6 times a day) will help. Use ice for the first few days to help decrease swelling and bruising, then switch to heat such as warm towels, sitz baths, warm baths, etc to help relax tight/sore spots and speed recovery.  Some people prefer to use ice alone, heat alone, alternating between ice & heat.  Experiment to what works for you.  Swelling and bruising can take several weeks to resolve.   c. It is helpful to take an over-the-counter pain medication regularly for the first few weeks.  Choose one of the following that works best for you: i. Naproxen (Aleve, etc)  Two 220mg  tabs twice a day ii. Ibuprofen (Advil, etc)  Three 200mg  tabs four times a day (every meal & bedtime) iii. Acetaminophen (Tylenol, etc) 500-650mg  four times a day (every meal & bedtime) d. A  prescription for pain medication (such as oxycodone, hydrocodone, etc) should be given to you upon discharge.  Take your pain medication as prescribed.  i. If you are having problems/concerns with the prescription medicine (does not control pain, nausea, vomiting, rash, itching, etc), please call us [phone] to see if we need to switch you to a different pain medicine that will work better for you and/or control your side effect better. ii. If you need a refill on your pain medication, please contact your pharmacy.  They will contact our office to request authorization. Prescriptions will not be filled after 5 pm or on week-ends.  Use a Sitz Bath 4-8 times a day for relief A sitz bath is a warm water bath taken in the sitting position that covers only the hips and buttocks. It may be used for either healing or hygiene purposes. Sitz baths are also used to relieve pain, itching, or muscle spasms. The water may contain medicine. Moist heat will help you heal and relax.  HOME CARE INSTRUCTIONS  Take 3 to 4 sitz baths a day. 1. Fill the bathtub half full with warm water. 2. Sit in the water and open the drain a little. 3. Turn on the warm water to keep the tub half full. Keep the water running constantly. 4. Soak  in the water for 15 to 20 minutes. 5. After the sitz bath, pat the affected area dry first. SEEK MEDICAL CARE IF:  You get worse instead of better. Stop the sitz baths if you get worse.   4. KEEP YOUR BOWELS REGULAR a. The goal is one bowel movement a day b. Avoid getting constipated.  Between the surgery and the pain medications, it is common to experience some constipation.  Increasing fluid intake and taking a fiber supplement (such as Metamucil, Citrucel, FiberCon, MiraLax, etc) 1-2 times a day regularly will usually help prevent this  problem from occurring.  A mild laxative (prune juice, Milk of Magnesia, MiraLax, etc) should be taken according to package directions if there are no bowel movements after 48 hours. c. Watch out for diarrhea.  If you have many loose bowel movements, simplify your diet to bland foods & liquids for a few days.  Stop any stool softeners and decrease your fiber supplement.  Switching to mild anti-diarrheal medications (Kayopectate, Pepto Bismol) can help.  If this worsens or does not improve, please call us.  5. Wound Care a. Remove your bandages the day after surgery.  Unless discharge instructions indicate otherwise, leave your bandage dry and in place overnight.  Remove the bandage during your first bowel movement.   b. Allow the wound packing to fall out over the next few days.  You can trim exposed gauze / ribbon as it falls out.  You do not need to repack the wound unless instructed otherwise.  Wear an absorbent pad or soft cotton gauze in your underwear as needed to catch any drainage and help keep the area  c. Keep the area clean and dry.  Bathe / shower every day.  Keep the area clean by showering / bathing over the incision / wound.   It is okay to soak an open wound to help wash it.  Wet wipes or showers / gentle washing after bowel movements is often less traumatic than regular toilet paper. d. Dennis Bast may have some styrofoam-like soft packing in the rectum which will come out with the first bowel movement.  e. You will often notice bleeding with bowel movements.  This should slow down by the end of the first week of surgery f. Expect some drainage.  This should slow down, too, by the end of the first week of surgery.  Wear an absorbent pad or soft cotton gauze in your underwear until the drainage stops. 6. ACTIVITIES as tolerated:   a. You may resume regular (light) daily activities beginning the next day-such as daily self-care, walking, climbing stairs-gradually increasing activities as tolerated.   If you can walk 30 minutes without difficulty, it is safe to try more intense activity such as jogging, treadmill, bicycling, low-impact aerobics, swimming, etc. b. Save the most intensive and strenuous activity for last such as sit-ups, heavy lifting, contact sports, etc  Refrain from any heavy lifting or straining until you are off narcotics for pain control.   c. DO NOT PUSH THROUGH PAIN.  Let pain be your guide: If it hurts to do something, don't do it.  Pain is your body warning you to avoid that activity for another week until the pain goes down. d. You may drive when you are no longer taking prescription pain medication, you can comfortably sit for long periods of time, and you can safely maneuver your car and apply brakes. e. Dennis Bast may have sexual intercourse when it is comfortable.  7. FOLLOW  UP in our office a. Please call [facility] at [phone] to set up an appointment to see your surgeon in the office for a follow-up appointment approximately 2 weeks after your surgery. b. Make sure that you call for this appointment the day you arrive home to insure a convenient appointment time. 10. IF YOU HAVE DISABILITY OR FAMILY LEAVE FORMS, BRING THEM TO THE OFFICE FOR PROCESSING.  DO NOT GIVE THEM TO YOUR DOCTOR.        WHEN TO CALL us [phone]: 1. Poor pain control 2. Reactions / problems with new medications (rash/itching, nausea, etc)  3. Fever over 101.5 F (38.5 C) 4. Inability to urinate 5. Nausea and/or vomiting 6. Worsening swelling or bruising 7. Continued bleeding from incision. 8. Increased pain, redness, or drainage from the incision  The clinic staff is available to answer your questions during regular business hours (8:30am-5pm).  Please don't hesitate to call and ask to speak to one of our nurses for clinical concerns.   A surgeon from [facility] is always on call at the hospitals   If you have a medical emergency, go to the nearest emergency room or call  911.    [facility], [address], [place], [place], [place]  [address] ? MAIN: [phone] ? TOLL FREE: [phone] ? FAX [phone] [email or web address]  HEMORRHOIDS  The rectum is the last foot of your colon, and it naturally stretches to hold stool.  Hemorrhoidal piles are natural clusters of blood vessels that help the rectum and anal canal stretch to hold stool and allow bowel movements to eliminate feces.   Hemorrhoids are abnormally swollen blood vessels in the rectum.  Too much pressure in the rectum causes hemorrhoids by forcing blood to stretch and bulge the walls of the veins, sometimes even rupturing them.  Hemorrhoids can become like varicose veins you might see on a person's legs.  Most people will develop a flare of hemorrhoids in their lifetime.  When bulging hemorrhoidal veins are irritated, they can swell, burn, itch, cause pain, and bleed.  Most flares will calm down gradually own within a few weeks.  However, once hemorrhoids are created, they are difficult to get rid of completely and tend to flare more easily than the first flare.   Fortunately, good habits and simple medical treatment usually control hemorrhoids well, and surgery is needed only in severe cases. Types of Hemorrhoids:  Internal hemorrhoids usually don't initially hurt or itch; they are deep inside the rectum and usually have no sensation. If they begin to push out (prolapse), pain and burning can occur.  However, internal hemorrhoids can bleed.  Anal bleeding should not be ignored since bleeding could come from a dangerous source like colorectal cancer, so persistent rectal bleeding should be investigated by a doctor, sometimes with a colonoscopy.  External hemorrhoids cause most of the symptoms - pain, burning, and itching. Nonirritated hemorrhoids can look like small skin tags coming out of the anus.   Thrombosed hemorrhoids can form when a hemorrhoid blood vessel  bursts and causes the hemorrhoid to suddenly swell.  A purple blood clot can form in it and become an excruciatingly painful lump at the anus. Because of these unpleasant symptoms, immediate incision and drainage by a surgeon at an office visit can provide much relief of the pain.    PREVENTION Avoiding the most frequent causes listed below will prevent most cases of hemorrhoids: Constipation Hard stools Diarrhea  Constant sitting  Straining with bowel movements Sitting on the toilet for a long time  Severe coughing  episodes Pregnancy / Childbirth  Heavy Lifting  Sometimes avoiding the above triggers is difficult:  How can you avoid sitting all day if you have a seated job? Also, we try to avoid coughing and diarrhea, but sometimes it's beyond your control.  Still, there are some practical hints to help: Keep the anal and genital area clean.  Moistened tissues such as flushable wet wipes are less irritating than toilet paper.  Using irrigating showers or bottle irrigation washing gently cleans this sensitive area.   Avoid dry toilet paper when cleaning after bowel movements.  Marland Kitchen Keep the anal and genital area dry.  Lightly pat the rectal area dry.  Avoid rubbing.  Talcum or baby powders can help GET YOUR STOOLS SOFT.   This is the most important way to prevent irritated hemorrhoids.  Hard stools are like sandpaper to the anorectal canal and will cause more problems.  The goal: ONE SOFT BOWEL MOVEMENT A DAY!  BMs from every other day to 3 times a day is a tolerable range Treat coughing, diarrhea and constipation early since irritated hemorrhoids may soon follow.  If your main job activity is seated, always stand or walk during your breaks. Make it a point to stand and walk at least 5 minutes every hour and try to shift frequently in your chair to avoid direct rectal pressure.  Always exhale as you strain or lift. Don't hold your breath.  Do not delay or try to prevent a bowel movement when the  urge is present. Exercise regularly (walking or jogging 60 minutes a day) to stimulate the bowels to move. No reading or other activity while on the toilet. If bowel movements take longer than 5 minutes, you are too constipated. AVOID CONSTIPATION Drink plenty of liquids (1 1/2 to 2 quarts of water and other fluids a day unless fluid restricted for another medical condition). Liquids that contain caffeine (coffee a, tea, soft drinks) can be dehydrating and should be avoided until constipation is controlled. Consider minimizing milk, as dairy products may be constipating. Eat plenty of fiber (30g a day ideal, more if needed).  Fiber is the undigested part of plant food that passes into the colon, acting as "natures broom" to encourage bowel motility and movement.  Fiber can absorb and hold large amounts of water. This results in a larger, bulkier stool, which is soft and easier to pass.  Eating foods high in fiber - 12 servings - such as  Vegetables: Root (potatoes, carrots, turnips), Leafy green (lettuce, salad greens, celery, spinach), High residue (cabbage, broccoli, etc.) Fruit: Fresh, Dried (prunes, apricots, cherries), Stewed (applesauce)  Whole grain breads, pasta, whole wheat Bran cereals, muffins, etc. Consider adding supplemental bulking fiber which retains large volumes of water: Psyllium ground seeds (native plant from [place])--available as Metamucil, Konsyl, Effersyllium, Per Diem Fiber, or the less expensive generic forms.  Citrucel  (methylcellulose wood fiber) . FiberCon (Polycarbophil) Polyethylene Glycol - and "artificial" fiber commonly called Miralax or Glycolax.  It is helpful for people with gassy or bloated feelings with regular fiber Flax Seed - a less gassy natural fiber  Laxatives can be useful for a short period if constipation is severe Osmotics (Milk of Magnesia, Fleets Phospho-Soda, Magnesium Citrate)  Stimulants (Senokot,   Castor Oil,  Dulcolax, Ex-Lax)     Laxatives are not a good long-term solution as it can stress the bowels and cause too  much mineral loss and dehydration.   Avoid taking laxatives for more than 7 days in a row.  AVOID DIARRHEA Switch to liquids and simpler foods for a few days to avoid stressing your intestines further. Avoid dairy products (especially milk & ice cream) for a short time.  The intestines often can lose the ability to digest lactose when stressed. Avoid foods that cause gassiness or bloating.  Typical foods include beans and other legumes, cabbage, broccoli, and dairy foods.  Every person has some sensitivity to other foods, so listen to your body and avoid those foods that trigger problems for you. Adding fiber (Citrucel, Metamucil, FiberCon, Flax seed, Miralax) gradually can help thicken stools by absorbing excess fluid and retrain the intestines to act more normally.  Slowly increase the dose over a few weeks.  Too much fiber too soon can backfire and cause cramping & bloating. Probiotics (such as active yogurt, Align, etc) may help repopulate the intestines and colon with normal bacteria and calm down a sensitive digestive tract.  Most studies show it to be of mild help, though, and such products can be costly. Medicines: Bismuth subsalicylate (ex. Kayopectate, Pepto Bismol) every 30 minutes for up to 6 doses can help control diarrhea.  Avoid if pregnant. Loperamide (Immodium) can slow down diarrhea.  Start with two tablets (4mg  total) first and then try one tablet every 6 hours.  Avoid if you are having fevers or severe pain.  If you are not better or start feeling worse, stop all medicines and call your doctor for advice Call your doctor if you are getting worse or not better.  Sometimes further testing (cultures, endoscopy, X-ray studies, bloodwork, etc) may be needed to help diagnose and treat the cause of the diarrhea. TREATMENT OF HEMORRHOID FLARE If these preventive measures fail, you must take action right  away! Hemorrhoids are one condition that can be mild in the morning and become intolerable by nightfall. Most hemorrhoidal flares take several weeks to calm down.  These suggestions can help: Warm soaks.  This helps more than any topical medication.  Use up to 8 times a day.  Usually sitz baths or sitting in a warm bathtub helps.  Sitting on moist warm towels are helpful.  Switching to ice packs/cool compresses can be helpful  Use a Sitz Bath 4-8 times a day for relief A sitz bath is a warm water bath taken in the sitting position that covers only the hips and buttocks. It may be used for either healing or hygiene purposes. Sitz baths are also used to relieve pain, itching, or muscle spasms. The water may contain medicine. Moist heat will help you heal and relax.  HOME CARE INSTRUCTIONS  Take 3 to 4 sitz baths a day. 6. Fill the bathtub half full with warm water. 7. Sit in the water and open the drain a little. 8. Turn on the warm water to keep the tub half full. Keep the water running constantly. 9. Soak in the water for 15 to 20 minutes. 10. After the sitz bath, pat the affected area dry first. SEEK MEDICAL CARE IF:  You get worse instead of better. Stop the sitz baths if you get worse.  Normalize your bowels.  Extremes of diarrhea or constipation will make hemorrhoids worse.  One soft bowel movement a day is the goal.  Fiber can help get your bowels regular Wet wipes instead of toilet paper Pain control with a NSAID such as ibuprofen (Advil) or naproxen (Aleve) or  acetaminophen (Tylenol) around the clock.  Narcotics are constipating and should be minimized if possible Topical creams contain steroids (bydrocortisone) or local anesthetic (xylocaine) can help make pain and itching more tolerable.   EVALUATION If hemorrhoids are still causing problems, you could benefit by an evaluation by a surgeon.  The surgeon will obtain a history and examine you.  If hemorrhoids are diagnosed, some therapies  can be offered in the office, usually with an anoscope into the less sensitive area of the rectum: -injection of hemorrhoids (sclerotherapy) can scar the blood vessels of the swollen/enlarged hemorrhoids to help shrink them down to a more normal size -rubber banding of the enlarged hemorrhoids to help shrink them down to a more normal size -drainage of the blood clot causing a thrombosed hemorrhoid,  to relieve the severe pain   While 90% of the time such problems from hemorrhoids can be managed without preceding to surgery, sometimes the hemorrhoids require a operation to control the problem (uncontrolled bleeding, prolapse, pain, etc.).   This involves being placed under general anesthesia where the surgeon can confirm the diagnosis and remove, suture, or staple the hemorrhoid(s).  Your surgeon can help you treat the problem appropriately.    GETTING TO GOOD BOWEL HEALTH. Irregular bowel habits such as constipation and diarrhea can lead to many problems over time.  Having one soft bowel movement a day is the most important way to prevent further problems.  The anorectal canal is designed to handle stretching and feces to safely manage our ability to get rid of solid waste (feces, poop, stool) out of our body.  BUT, hard constipated stools can act like ripping concrete bricks and diarrhea can be a burning fire to this very sensitive area of our body, causing inflamed hemorrhoids, anal fissures, increasing risk is perirectal abscesses, abdominal pain/bloating, an making irritable bowel worse.     The goal: ONE SOFT BOWEL MOVEMENT A DAY!  To have soft, regular bowel movements:    Drink at least 8 tall glasses of water a day.     Take plenty of fiber.  Fiber is the undigested part of plant food that passes into the colon, acting s "natures broom" to encourage bowel motility and movement.  Fiber can absorb and hold large amounts of water. This results in a larger, bulkier stool, which is soft and easier to  pass. Work gradually over several weeks up to 6 servings a day of fiber (25g a day even more if needed) in the form of: o Vegetables -- Root (potatoes, carrots, turnips), leafy green (lettuce, salad greens, celery, spinach), or cooked high residue (cabbage, broccoli, etc) o Fruit -- Fresh (unpeeled skin & pulp), Dried (prunes, apricots, cherries, etc ),  or stewed ( applesauce)  o Whole grain breads, pasta, etc (whole wheat)  o Bran cereals    Bulking Agents -- This type of water-retaining fiber generally is easily obtained each day by one of the following:  o Psyllium bran -- The psyllium plant is remarkable because its ground seeds can retain so much water. This product is available as Metamucil, Konsyl, Effersyllium, Per Diem Fiber, or the less expensive generic preparation in drug and health food stores. Although labeled a laxative, it really is not a laxative.  o Methylcellulose -- This is another fiber derived from wood which also retains water. It is available as Citrucel. o Polyethylene Glycol - and "artificial" fiber commonly called Miralax or Glycolax.  It is helpful for people with gassy or bloated  feelings with regular fiber o Flax Seed - a less gassy fiber than psyllium   No reading or other relaxing activity while on the toilet. If bowel movements take longer than 5 minutes, you are too constipated   AVOID CONSTIPATION.  High fiber and water intake usually takes care of this.  Sometimes a laxative is needed to stimulate more frequent bowel movements, but    Laxatives are not a good long-term solution as it can wear the colon out. o Osmotics (Milk of Magnesia, Fleets phosphosoda, Magnesium citrate, MiraLax, GoLytely) are safer than  o Stimulants (Senokot, Castor Oil, Dulcolax, Ex Lax)    o Do not take laxatives for more than 7days in a row.    IF SEVERELY CONSTIPATED, try a Bowel Retraining Program: o Do not use laxatives.  o Eat a diet high in roughage, such as bran cereals and leafy  vegetables.  o Drink six (6) ounces of prune or apricot juice each morning.  o Eat two (2) large servings of stewed fruit each day.  o Take one (1) heaping tablespoon of a psyllium-based bulking agent twice a day. Use sugar-free sweetener when possible to avoid excessive calories.  o Eat a normal breakfast.  o Set aside 15 minutes after breakfast to sit on the toilet, but do not strain to have a bowel movement.  o If you do not have a bowel movement by the third day, use an enema and repeat the above steps.    Controlling diarrhea o Switch to liquids and simpler foods for a few days to avoid stressing your intestines further. o Avoid dairy products (especially milk & ice cream) for a short time.  The intestines often can lose the ability to digest lactose when stressed. o Avoid foods that cause gassiness or bloating.  Typical foods include beans and other legumes, cabbage, broccoli, and dairy foods.  Every person has some sensitivity to other foods, so listen to our body and avoid those foods that trigger problems for you. o Adding fiber (Citrucel, Metamucil, psyllium, Miralax) gradually can help thicken stools by absorbing excess fluid and retrain the intestines to act more normally.  Slowly increase the dose over a few weeks.  Too much fiber too soon can backfire and cause cramping & bloating. o Probiotics (such as active yogurt, Align, etc) may help repopulate the intestines and colon with normal bacteria and calm down a sensitive digestive tract.  Most studies show it to be of mild help, though, and such products can be costly. o Medicines:   Bismuth subsalicylate (ex. Kayopectate, Pepto Bismol) every 30 minutes for up to 6 doses can help control diarrhea.  Avoid if pregnant.   Loperamide (Immodium) can slow down diarrhea.  Start with two tablets (4mg  total) first and then try one tablet every 6 hours.  Avoid if you are having fevers or severe pain.  If you are not better or start feeling worse,  stop all medicines and call your doctor for advice o Call your doctor if you are getting worse or not better.  Sometimes further testing (cultures, endoscopy, X-ray studies, bloodwork, etc) may be needed to help diagnose and treat the cause of the diarrhea. o

## 2014-02-16 ENCOUNTER — Emergency Department (HOSPITAL_COMMUNITY): Payer: BC Managed Care – PPO

## 2014-02-16 ENCOUNTER — Telehealth (INDEPENDENT_AMBULATORY_CARE_PROVIDER_SITE_OTHER): Payer: Self-pay | Admitting: General Surgery

## 2014-02-16 ENCOUNTER — Inpatient Hospital Stay (HOSPITAL_COMMUNITY)
Admission: EM | Admit: 2014-02-16 | Discharge: 2014-02-19 | DRG: 747 | Disposition: A | Discharge status: Home / Self Care | Payer: BC Managed Care – PPO | Attending: General Surgery | Admitting: General Surgery

## 2014-02-16 ENCOUNTER — Encounter (HOSPITAL_COMMUNITY): Payer: Self-pay | Admitting: Emergency Medicine

## 2014-02-16 DIAGNOSIS — I82890 Acute embolism and thrombosis of other specified veins: Secondary | ICD-10-CM

## 2014-02-16 DIAGNOSIS — N9089 Other specified noninflammatory disorders of vulva and perineum: Principal | ICD-10-CM | POA: Diagnosis present

## 2014-02-16 DIAGNOSIS — K6289 Other specified diseases of anus and rectum: Secondary | ICD-10-CM | POA: Diagnosis present

## 2014-02-16 DIAGNOSIS — Z888 Allergy status to other drugs, medicaments and biological substances status: Secondary | ICD-10-CM

## 2014-02-16 DIAGNOSIS — Z79899 Other long term (current) drug therapy: Secondary | ICD-10-CM

## 2014-02-16 DIAGNOSIS — Z803 Family history of malignant neoplasm of breast: Secondary | ICD-10-CM

## 2014-02-16 DIAGNOSIS — F411 Generalized anxiety disorder: Secondary | ICD-10-CM | POA: Diagnosis present

## 2014-02-16 DIAGNOSIS — D215 Benign neoplasm of connective and other soft tissue of pelvis: Secondary | ICD-10-CM

## 2014-02-16 DIAGNOSIS — K5900 Constipation, unspecified: Secondary | ICD-10-CM | POA: Diagnosis present

## 2014-02-16 LAB — CBC WITH DIFFERENTIAL/PLATELET
Basophils Absolute: 0 10*3/uL (ref 0.0–0.1)
Basophils Relative: 0 % (ref 0–1)
EOS ABS: 0.1 10*3/uL (ref 0.0–0.7)
Eosinophils Relative: 2 % (ref 0–5)
HCT: 37.5 % (ref 36.0–46.0)
HEMOGLOBIN: 13.4 g/dL (ref 12.0–15.0)
LYMPHS ABS: 1.5 10*3/uL (ref 0.7–4.0)
Lymphocytes Relative: 34 % (ref 12–46)
MCH: 30.6 pg (ref 26.0–34.0)
MCHC: 35.7 g/dL (ref 30.0–36.0)
MCV: 85.6 fL (ref 78.0–100.0)
MONOS PCT: 6 % (ref 3–12)
Monocytes Absolute: 0.2 10*3/uL (ref 0.1–1.0)
Neutro Abs: 2.5 10*3/uL (ref 1.7–7.7)
Neutrophils Relative %: 58 % (ref 43–77)
PLATELETS: 202 10*3/uL (ref 150–400)
RBC: 4.38 MIL/uL (ref 3.87–5.11)
RDW: 13 % (ref 11.5–15.5)
WBC: 4.4 10*3/uL (ref 4.0–10.5)

## 2014-02-16 LAB — WET PREP, GENITAL
Clue Cells Wet Prep HPF POC: NONE SEEN
Trich, Wet Prep: NONE SEEN
YEAST WET PREP: NONE SEEN

## 2014-02-16 LAB — URINALYSIS, ROUTINE W REFLEX MICROSCOPIC
BILIRUBIN URINE: NEGATIVE
Glucose, UA: NEGATIVE mg/dL
HGB URINE DIPSTICK: NEGATIVE
KETONES UR: NEGATIVE mg/dL
Leukocytes, UA: NEGATIVE
NITRITE: NEGATIVE
PROTEIN: NEGATIVE mg/dL
Specific Gravity, Urine: 1.014 (ref 1.005–1.030)
Urobilinogen, UA: 0.2 mg/dL (ref 0.0–1.0)
pH: 6 (ref 5.0–8.0)

## 2014-02-16 LAB — PREGNANCY, URINE: Preg Test, Ur: NEGATIVE

## 2014-02-16 LAB — BASIC METABOLIC PANEL
BUN: 9 mg/dL (ref 6–23)
CALCIUM: 8.8 mg/dL (ref 8.4–10.5)
CO2: 20 mEq/L (ref 19–32)
Chloride: 107 mEq/L (ref 96–112)
Creatinine, Ser: 0.7 mg/dL (ref 0.50–1.10)
GFR calc Af Amer: >90 mL/min (ref >90)
GFR calc non Af Amer: >90 mL/min (ref >90)
GLUCOSE: 86 mg/dL (ref 70–99)
Potassium: 3.4 mEq/L — ABNORMAL LOW (ref 3.7–5.3)
Sodium: 141 mEq/L (ref 137–147)

## 2014-02-16 MED ORDER — HEPARIN SODIUM (PORCINE) 5000 UNIT/ML IJ SOLN
5000.0000 [IU] | Freq: Three times a day (TID) | INTRAMUSCULAR | Status: DC
  Administered 2014-02-16 – 2014-02-17 (×3): 5000 [IU] via SUBCUTANEOUS
  Filled 2014-02-16 (×5): qty 1

## 2014-02-16 MED ORDER — ONDANSETRON HCL 4 MG/2ML IJ SOLN
4.0000 mg | Freq: Once | INTRAMUSCULAR | Status: AC
  Administered 2014-02-16: 4 mg via INTRAVENOUS
  Filled 2014-02-16: qty 2

## 2014-02-16 MED ORDER — SODIUM CHLORIDE 0.9 % IV BOLUS (SEPSIS)
1000.0000 mL | Freq: Once | INTRAVENOUS | Status: AC
  Administered 2014-02-16: 1000 mL via INTRAVENOUS

## 2014-02-16 MED ORDER — MORPHINE SULFATE 4 MG/ML IJ SOLN
4.0000 mg | Freq: Once | INTRAMUSCULAR | Status: AC
  Administered 2014-02-16: 4 mg via INTRAVENOUS
  Filled 2014-02-16: qty 1

## 2014-02-16 MED ORDER — HYDROMORPHONE HCL PF 1 MG/ML IJ SOLN
1.0000 mg | Freq: Once | INTRAMUSCULAR | Status: AC
  Administered 2014-02-16: 1 mg via INTRAVENOUS
  Filled 2014-02-16: qty 1

## 2014-02-16 MED ORDER — HYDROMORPHONE HCL PF 1 MG/ML IJ SOLN
0.5000 mg | Freq: Once | INTRAMUSCULAR | Status: AC
  Administered 2014-02-16: 0.5 mg via INTRAVENOUS
  Filled 2014-02-16: qty 1

## 2014-02-16 MED ORDER — IOHEXOL 300 MG/ML  SOLN: 50.0000 mL | Freq: Once | INTRAMUSCULAR | Status: AC | PRN

## 2014-02-16 MED ORDER — HYDROMORPHONE HCL PF 1 MG/ML IJ SOLN
0.5000 mg | INTRAMUSCULAR | Status: DC | PRN
  Administered 2014-02-16 – 2014-02-17 (×7): 1 mg via INTRAVENOUS
  Filled 2014-02-16 (×7): qty 1

## 2014-02-16 MED ORDER — KCL IN DEXTROSE-NACL 20-5-0.9 MEQ/L-%-% IV SOLN
INTRAVENOUS | Status: DC
  Administered 2014-02-16 – 2014-02-18 (×5): via INTRAVENOUS
  Administered 2014-02-19: 100 mL via INTRAVENOUS
  Filled 2014-02-16 (×7): qty 1000

## 2014-02-16 MED ORDER — ONDANSETRON HCL 4 MG/2ML IJ SOLN
4.0000 mg | Freq: Four times a day (QID) | INTRAMUSCULAR | Status: DC | PRN
  Administered 2014-02-16 – 2014-02-19 (×6): 4 mg via INTRAVENOUS
  Filled 2014-02-16 (×8): qty 2

## 2014-02-16 MED ORDER — IOHEXOL 300 MG/ML  SOLN
100.0000 mL | Freq: Once | INTRAMUSCULAR | Status: AC | PRN
  Administered 2014-02-16: 100 mL via INTRAVENOUS

## 2014-02-16 MED ORDER — METRONIDAZOLE IN NACL 5-0.79 MG/ML-% IV SOLN
500.0000 mg | Freq: Three times a day (TID) | INTRAVENOUS | Status: AC
  Administered 2014-02-16 – 2014-02-18 (×7): 500 mg via INTRAVENOUS
  Filled 2014-02-16 (×7): qty 100

## 2014-02-16 MED ORDER — CIPROFLOXACIN IN D5W 400 MG/200ML IV SOLN
400.0000 mg | Freq: Two times a day (BID) | INTRAVENOUS | Status: DC
  Administered 2014-02-16 – 2014-02-17 (×2): 400 mg via INTRAVENOUS
  Filled 2014-02-16 (×4): qty 200

## 2014-02-16 MED ORDER — IOHEXOL 300 MG/ML  SOLN
50.0000 mL | Freq: Once | INTRAMUSCULAR | Status: AC | PRN
  Administered 2014-02-16: 50 mL via ORAL

## 2014-02-16 NOTE — ED Notes (Signed)
Family at bedside. 

## 2014-02-16 NOTE — ED Provider Notes (Signed)
Medical screening examination/treatment/procedure(s) were performed by non-physician practitioner and as supervising physician I was immediately available for consultation/collaboration.   EKG Interpretation None      Rolland Porter, MD, [facility]   Janice Norrie, MD 02/16/14 2050

## 2014-02-16 NOTE — Telephone Encounter (Signed)
Patient called complaining of increasing rectal pain. She was advised to come into the emergency room for reevaluation.

## 2014-02-16 NOTE — ED Notes (Signed)
Pt from home reports that she was seen at [place] for possible abscess to her genital area. Pt had scope done by Dr. Johney Maine in office and was told if her pain increased to come to ED for Abd scan. Pt states that she has had this pain 5 days, but today is worse. Pt is A&O and in NAD

## 2014-02-16 NOTE — ED Provider Notes (Signed)
CSN: [id number]     Arrival date & time 02/16/14  1016 History   First MD Initiated Contact with Patient 02/16/14 1048     Chief Complaint  Patient presents with  . Abscess   HPI  Lindsay Giles is a 38 y.o. female with a PMH of menorrhagia and migraines who presents to the ED for evaluation of an abscess.  History was provided by the patient. Patient states she has had progressively worsening constant rectal pain since Monday 02/11/14. Pain is located in her rectum with radiation up her lower back. Her pain is worse with palpation, movement and she states "I just can't get comfortable." She took a hydrocodone yesterday which did not improve her pain. She denies any trauma or injuries. Has never had similar pain in the past. Denies any rectal bleeding or drainage of pus. Went to GI specialist Dr. Oletta Lamas on Thursday (02/14/14) and was started on Flagyl. She went to see Dr. Johney Maine with surgery yesterday (02/15/13) and had an anoscope performed in the office. Also started on Cipro. Dr. Johney Maine unable to visualize any abscess but was unable to fully visualize the area (per patient). Patient states she was told to come to the ED if her pain worsens for CT scan of the abdomen and pelvis. Patient states her pain has worsened over the past 24 hours. She has had fatigue, chills, and nausea. One episode of emesis a few days ago. She has a hx of constipation with her last BM two days ago. No dysuria, vaginal bleeding/discharge, vaginal pain, or genital sores.    Past Medical History  Diagnosis Date  . Abnormal Pap smear   . Menorrhagia    Past Surgical History  Procedure Laterality Date  . Tonsillectomy and adenoidectomy    . Dilation and curettage of uterus     Family History  Problem Relation Age of Onset  . Breast cancer Paternal Grandmother   . Cancer Paternal Grandmother 65    breast  . Hypothyroidism Mother    History  Substance Use Topics  . Smoking status: Never Smoker   . Smokeless  tobacco: Never Used  . Alcohol Use: 0.5 oz/week    1 drink(s) per week   OB History   Grav Para Term Preterm Abortions TAB SAB Ect Mult Living   2 2              Review of Systems  Constitutional: Positive for chills and fatigue. Negative for fever, diaphoresis, activity change and appetite change.  Respiratory: Negative for cough and shortness of breath.   Cardiovascular: Negative for chest pain and leg swelling.  Gastrointestinal: Positive for nausea, vomiting (see HPI - one episode), constipation and rectal pain. Negative for abdominal pain, diarrhea, blood in stool and abdominal distention.  Musculoskeletal: Positive for back pain. Negative for gait problem and myalgias.  Skin: Negative for color change and wound.  Neurological: Negative for dizziness, weakness, light-headedness and headaches.    Allergies  Atrovent  Home Medications   Prior to Admission medications   Medication Sig Start Date End Date Taking? Authorizing Provider  cholecalciferol (VITAMIN D) 1000 UNITS tablet Take 1,000 Units by mouth daily.   Yes Historical Provider, MD  ciprofloxacin (CIPRO) 500 MG tablet Take 1 tablet (500 mg total) by mouth 2 (two) times daily. 02/15/14  Yes Adin Hector, MD  desvenlafaxine (PRISTIQ) 100 MG 24 hr tablet Take 100 mg by mouth daily.   Yes Historical Provider, MD  HYDROcodone-acetaminophen (NORCO) 5-325 MG  per tablet Take 1 tablet by mouth every 6 (six) hours as needed for moderate pain. 10/11/13  Yes Heather Elnora Morrison, PA-C  Melatonin 3 MG CAPS Take 3 mg by mouth at bedtime as needed (sleep).    Yes Historical Provider, MD  metroNIDAZOLE (FLAGYL) 500 MG tablet Take 500 mg by mouth 3 (three) times daily. 02/14/14 ten day supply   Yes Historical Provider, MD  promethazine (PHENERGAN) 25 MG tablet Take 1 tablet (25 mg total) by mouth every 8 (eight) hours as needed for nausea or vomiting. 10/11/13  Yes Collene Leyden, PA-C  topiramate (TOPAMAX) 25 MG tablet Take 25-125 mg by  mouth 3 (three) times daily. 50mg  in the morning and 50 mg with lunch and 125mg  at bedtime   Yes Historical Provider, MD   BP 109/82  Pulse 105  Temp(Src) 98.3 F (36.8 C) (Oral)  Resp 16  SpO2 100%  LMP 01/30/2014  Filed Vitals:   02/16/14 1030 02/16/14 1509  BP: 109/82 100/63  Pulse: 105 72  Temp: 98.3 F (36.8 C)   TempSrc: Oral   Resp: 16   SpO2: 100% 98%    Physical Exam  Nursing note and vitals reviewed. Constitutional: She is oriented to person, place, and time. She appears well-developed and well-nourished. No distress.  HENT:  Head: Normocephalic and atraumatic.  Right Ear: External ear normal.  Left Ear: External ear normal.  Nose: Nose normal.  Mouth/Throat: Oropharynx is clear and moist.  Eyes: Conjunctivae are normal. Right eye exhibits no discharge. Left eye exhibits no discharge.  Neck: Normal range of motion. Neck supple.  Cardiovascular: Normal rate, regular rhythm and normal heart sounds.  Exam reveals no gallop and no friction rub.   No murmur heard. Pulmonary/Chest: Effort normal and breath sounds normal. No respiratory distress. She has no wheezes. She has no rales. She exhibits no tenderness.  Abdominal: Soft. Bowel sounds are normal. She exhibits no distension and no mass. There is no tenderness. There is no rebound and no guarding.  Genitourinary:     Two <1 cm non-thrombosed non-tender hemorrhoids. No anal fissures. No rectal bleeding or discharge. 2 cm x 2 cm circular firm internal mass palpated at the left inferior wall of the vagina which extends into the perineum. No external bulging. No tenderness to palpation. No masses or edema to the external genitalia.   Musculoskeletal: Normal range of motion. She exhibits no edema and no tenderness.  No CVA, lumbar, or flank tenderness bilaterally.   Neurological: She is alert and oriented to person, place, and time.  Skin: Skin is warm and dry. She is not diaphoretic.     ED Course  Procedures  (including critical care time) Labs Review Labs Reviewed - No data to display  Imaging Review Ct Abdomen Pelvis W Contrast  02/16/2014   CLINICAL DATA:  Worsening rectal pain.  EXAM: CT ABDOMEN AND PELVIS WITH CONTRAST  TECHNIQUE: Multidetector CT imaging of the abdomen and pelvis was performed using the standard protocol following bolus administration of intravenous contrast.  CONTRAST:  123mL OMNIPAQUE IOHEXOL 300 MG/ML SOLN, 31mL OMNIPAQUE IOHEXOL 300 MG/ML SOLN  COMPARISON:  None  FINDINGS: The visualized lung bases are clear. There is no pleural effusion. Bilateral breast implants are partially visualized.  The liver, gallbladder, spleen small adrenal glands, kidneys, and pancreas have an unremarkable enhanced appearance.  Contrast is present in nondilated loops of small bowel without evidence of obstruction. Colon is nondilated. The appendix is identified in the right lower quadrant  and is unremarkable. Rectum is mildly distended with gas without evidence of wall thickening or perirectal fluid collection/abscess.  Bladder is unremarkable. Uterus and ovaries are grossly unremarkable. Trace free fluid is present in the pelvis. Small sclerotic foci in the L1 vertebral body and posterior right ilium likely represent bone islands. Small Schmorl's nodes are noted in the lower thoracic spine.  IMPRESSION: No etiology of rectal pain identified.  Trace pelvic free fluid.   Electronically Signed   By: Logan Bores   On: 02/16/2014 13:05     EKG Interpretation None      Results for orders placed during the hospital encounter of 02/16/14  WET PREP, GENITAL      Result Value Ref Range   Yeast Wet Prep HPF POC NONE SEEN  NONE SEEN   Trich, Wet Prep NONE SEEN  NONE SEEN   Clue Cells Wet Prep HPF POC NONE SEEN  NONE SEEN   WBC, Wet Prep HPF POC TOO NUMEROUS TO COUNT (*) NONE SEEN  CBC WITH DIFFERENTIAL      Result Value Ref Range   WBC 4.4  4.0 - 10.5 K/uL   RBC 4.38  3.87 - 5.11 MIL/uL   Hemoglobin  13.4  12.0 - 15.0 g/dL   HCT 37.5  36.0 - 46.0 %   MCV 85.6  78.0 - 100.0 fL   MCH 30.6  26.0 - 34.0 pg   MCHC 35.7  30.0 - 36.0 g/dL   RDW 13.0  11.5 - 15.5 %   Platelets 202  150 - 400 K/uL   Neutrophils Relative % 58  43 - 77 %   Neutro Abs 2.5  1.7 - 7.7 K/uL   Lymphocytes Relative 34  12 - 46 %   Lymphs Abs 1.5  0.7 - 4.0 K/uL   Monocytes Relative 6  3 - 12 %   Monocytes Absolute 0.2  0.1 - 1.0 K/uL   Eosinophils Relative 2  0 - 5 %   Eosinophils Absolute 0.1  0.0 - 0.7 K/uL   Basophils Relative 0  0 - 1 %   Basophils Absolute 0.0  0.0 - 0.1 K/uL  BASIC METABOLIC PANEL      Result Value Ref Range   Sodium 141  137 - 147 mEq/L   Potassium 3.4 (*) 3.7 - 5.3 mEq/L   Chloride 107  96 - 112 mEq/L   CO2 20  19 - 32 mEq/L   Glucose, Bld 86  70 - 99 mg/dL   BUN 9  6 - 23 mg/dL   Creatinine, Ser 0.70  0.50 - 1.10 mg/dL   Calcium 8.8  8.4 - 10.5 mg/dL   GFR calc non Af Amer >90  >90 mL/min   GFR calc Af Amer >90  >90 mL/min  URINALYSIS, ROUTINE W REFLEX MICROSCOPIC      Result Value Ref Range   Color, Urine YELLOW  YELLOW   APPearance CLEAR  CLEAR   Specific Gravity, Urine 1.014  1.005 - 1.030   pH 6.0  5.0 - 8.0   Glucose, UA NEGATIVE  NEGATIVE mg/dL   Hgb urine dipstick NEGATIVE  NEGATIVE   Bilirubin Urine NEGATIVE  NEGATIVE   Ketones, ur NEGATIVE  NEGATIVE mg/dL   Protein, ur NEGATIVE  NEGATIVE mg/dL   Urobilinogen, UA 0.2  0.0 - 1.0 mg/dL   Nitrite NEGATIVE  NEGATIVE   Leukocytes, UA NEGATIVE  NEGATIVE  PREGNANCY, URINE      Result Value Ref Range  Preg Test, Ur NEGATIVE  NEGATIVE     MDM   SHANYRA RYCROFT is a 38 y.o. female with a PMH of menorrhagia and migraines who presents to the ED for evaluation of an abscess.    Rechecks  1:15 PM = Pain not controlled after morphine. Ordering 1 mg dilaudid.  2:45 PM = Pelvic exam performed at bedside with ED tech present. 2 cm x 2 cm firm mass palpated at the left inferior wall of the vagina which extends inferiorly  into the perineum. No tenderness to palpation. No masses or edema to the external genitalia.  3:55 PM = Pain returning. Pain is a 5/10. Ordering 0.5 mg dilaudid.      4:00 PM = Signed out care to Noland Fordyce PA-C who will await results of pelvic ultrasound and consult surgery. Etiology of rectal and perineal pain is unclear. No clear signs of an abscess. CT scan of the abdomen and pelvis with no identifiable cause of pain. Labs unremarkable. No leukocytosis. UA negative for urinary tract infection. Wet prep with numerous white blood cell count with no clear etiology. Pelvic exam unremarkable. No concern for PID. Abdominal exam benign. Patient afebrile and nontoxic in appearance. Vital signs are stable. Await results of pelvic ultrasound and further direction from surgery.   Mercy Moore PA-C   This patient was discussed with Dr. Marella Bile, PA-C 02/17/14 [phone]

## 2014-02-16 NOTE — ED Provider Notes (Signed)
4:06 PM Pt signed out to myself by Harvie Heck, PA-C at shift change.  Pt is stable, resting comfortably.  Pelvic U/S pending.  Plan is to f/u on pelvic U/S.  If unremarkable, will call surgery to discuss f/u for pt's pain.    6:05 PM  Pelvic U/S negative for acute or chronic findings to suggest cause of pt's pain.  Left and Right ovaries normal in appearance. No masses. No free fluid, specifically no free fluid with targeted ultrasound of the perineum in area of pt's pain.    Discussed findings with pt and father who are concerned pain will come back and would like to find out what is causing pt's symptoms. Will consult with Dr. Excell Seltzer, general surgery, to determine appropriate care for pt at this time.   7:00 PM Dr. Excell Seltzer examined pt and spoke with pt and husband.  Pt will be admitted for IV antibiotics and possible further evaluation under anesthesia tomorrow if pain persists.     Noland Fordyce, PA-C 02/16/14 1942

## 2014-02-16 NOTE — ED Notes (Signed)
Surgeon at bedside.  

## 2014-02-16 NOTE — H&P (Signed)
Lindsay Giles is an 38 y.o. female.    Chief Complaint: severe rectal/perineal pain  HPI: patient is a generally healthy 38 year old female who approximately 5 days ago developed the onset of rectal or perineal pain which she feels just to the left of her anus. She has some history of chronic constipation but no previous similar symptoms. No history of colitis or rectal abscess. Over the next couple of days the pain gradually intensified. She was seen by her GI physician Dr. Oletta Lamas and then referred to our office and saw Dr. Johney Maine on Friday. She had been started on oral antibiotics on Thursday, 2 days ago. Evaluation and examination in the office by Dr. Johney Maine on Friday questioned possibly a little fullness in the left perirectal area but was nonspecific. Continued antibiotics and observation was recommended and she was instructed to call should get any worse. This morning the pain was markedly worse. She describes constant aching pain which she feels just to the left of her rectum and just anterior to her rectum. She has been having some small loose bowel movements which did not affect the pain. No bleeding. No fever or chills. She feels like there is some fullness or a lump just to the left and anterior to her anus in the perineum. Last menstrual period was normal. No discharge or abnormal bleeding.  She called earlier today and was advised to come to the emergency department for evaluation due to her worsening symptoms.  Past Medical History  Diagnosis Date  . Abnormal Pap smear   . Menorrhagia     Past Surgical History  Procedure Laterality Date  . Tonsillectomy and adenoidectomy    . Dilation and curettage of uterus      Family History  Problem Relation Age of Onset  . Breast cancer Paternal Grandmother   . Cancer Paternal Grandmother 30    breast  . Hypothyroidism Mother    Social History:  reports that she has never smoked. She has never used smokeless tobacco. She reports that  she drinks about .5 ounces of alcohol per week. She reports that she does not use illicit drugs.  Allergies:  Allergies  Allergen Reactions  . Atrovent [Ipratropium] Other (See Comments)    Uncontrollable nosebleeds      Current Facility-Administered Medications  Medication Dose Route Frequency Provider Last Rate Last Dose  . iohexol (OMNIPAQUE) 300 MG/ML solution 50 mL  50 mL Intravenous Once PRN Leota Jacobsen, MD       Current Outpatient Prescriptions  Medication Sig Dispense Refill  . cholecalciferol (VITAMIN D) 1000 UNITS tablet Take 1,000 Units by mouth daily.      . ciprofloxacin (CIPRO) 500 MG tablet Take 1 tablet (500 mg total) by mouth 2 (two) times daily.  20 tablet  2  . desvenlafaxine (PRISTIQ) 100 MG 24 hr tablet Take 100 mg by mouth daily.      Marland Kitchen HYDROcodone-acetaminophen (NORCO) 5-325 MG per tablet Take 1 tablet by mouth every 6 (six) hours as needed for moderate pain.  30 tablet  0  . Melatonin 3 MG CAPS Take 3 mg by mouth at bedtime as needed (sleep).       . metroNIDAZOLE (FLAGYL) 500 MG tablet Take 500 mg by mouth 3 (three) times daily. 02/14/14 ten day supply      . promethazine (PHENERGAN) 25 MG tablet Take 1 tablet (25 mg total) by mouth every 8 (eight) hours as needed for nausea or vomiting.  20 tablet  0  .  topiramate (TOPAMAX) 25 MG tablet Take 25-125 mg by mouth 3 (three) times daily. 45m in the morning and 50 mg with lunch and 1234mat bedtime         Results for orders placed during the hospital encounter of 02/16/14 (from the past 48 hour(s))  CBC WITH DIFFERENTIAL     Status: None   Collection Time    02/16/14 11:31 AM      Result Value Ref Range   WBC 4.4  4.0 - 10.5 K/uL   RBC 4.38  3.87 - 5.11 MIL/uL   Hemoglobin 13.4  12.0 - 15.0 g/dL   HCT 37.5  36.0 - 46.0 %   MCV 85.6  78.0 - 100.0 fL   MCH 30.6  26.0 - 34.0 pg   MCHC 35.7  30.0 - 36.0 g/dL   RDW 13.0  11.5 - 15.5 %   Platelets 202  150 - 400 K/uL   Neutrophils Relative % 58  43 - 77 %    Neutro Abs 2.5  1.7 - 7.7 K/uL   Lymphocytes Relative 34  12 - 46 %   Lymphs Abs 1.5  0.7 - 4.0 K/uL   Monocytes Relative 6  3 - 12 %   Monocytes Absolute 0.2  0.1 - 1.0 K/uL   Eosinophils Relative 2  0 - 5 %   Eosinophils Absolute 0.1  0.0 - 0.7 K/uL   Basophils Relative 0  0 - 1 %   Basophils Absolute 0.0  0.0 - 0.1 K/uL  BASIC METABOLIC PANEL     Status: Abnormal   Collection Time    02/16/14 11:31 AM      Result Value Ref Range   Sodium 141  137 - 147 mEq/L   Potassium 3.4 (*) 3.7 - 5.3 mEq/L   Chloride 107  96 - 112 mEq/L   CO2 20  19 - 32 mEq/L   Glucose, Bld 86  70 - 99 mg/dL   BUN 9  6 - 23 mg/dL   Creatinine, Ser 0.70  0.50 - 1.10 mg/dL   Calcium 8.8  8.4 - 10.5 mg/dL   GFR calc non Af Amer >90  >90 mL/min   GFR calc Af Amer >90  >90 mL/min   Comment: (NOTE)     The eGFR has been calculated using the CKD EPI equation.     This calculation has not been validated in all clinical situations.     eGFR's persistently <90 mL/min signify possible Chronic Kidney     Disease.  URINALYSIS, ROUTINE W REFLEX MICROSCOPIC     Status: None   Collection Time    02/16/14 12:53 PM      Result Value Ref Range   Color, Urine YELLOW  YELLOW   APPearance CLEAR  CLEAR   Specific Gravity, Urine 1.014  1.005 - 1.030   pH 6.0  5.0 - 8.0   Glucose, UA NEGATIVE  NEGATIVE mg/dL   Hgb urine dipstick NEGATIVE  NEGATIVE   Bilirubin Urine NEGATIVE  NEGATIVE   Ketones, ur NEGATIVE  NEGATIVE mg/dL   Protein, ur NEGATIVE  NEGATIVE mg/dL   Urobilinogen, UA 0.2  0.0 - 1.0 mg/dL   Nitrite NEGATIVE  NEGATIVE   Leukocytes, UA NEGATIVE  NEGATIVE   Comment: MICROSCOPIC NOT DONE ON URINES WITH NEGATIVE PROTEIN, BLOOD, LEUKOCYTES, NITRITE, OR GLUCOSE <1000 mg/dL.  PREGNANCY, URINE     Status: None   Collection Time    02/16/14 12:53 PM  Result Value Ref Range   Preg Test, Ur NEGATIVE  NEGATIVE   Comment:            THE SENSITIVITY OF THIS     METHODOLOGY IS >20 mIU/mL.  WET PREP, GENITAL      Status: Abnormal   Collection Time    02/16/14  2:46 PM      Result Value Ref Range   Yeast Wet Prep HPF POC NONE SEEN  NONE SEEN   Trich, Wet Prep NONE SEEN  NONE SEEN   Clue Cells Wet Prep HPF POC NONE SEEN  NONE SEEN   WBC, Wet Prep HPF POC TOO NUMEROUS TO COUNT (*) NONE SEEN   US Transvaginal Non-ob  02/16/2014   CLINICAL DATA:  Vaginal pain.  EXAM: TRANSABDOMINAL AND TRANSVAGINAL ULTRASOUND OF PELVIS  TECHNIQUE: Both transabdominal and transvaginal ultrasound examinations of the pelvis were performed. Transabdominal technique was performed for global imaging of the pelvis including uterus, ovaries, adnexal regions, and pelvic cul-de-sac. It was necessary to proceed with endovaginal exam following the transabdominal exam to visualize the uterus, endometrium, and ovaries.  COMPARISON:  CT abdomen and pelvis 02/16/2014 and pelvic ultrasound 05/01/2007  FINDINGS: Uterus  Measurements: 9.6 x 4.3 x 5.6 cm. No fibroids or other mass visualized.  Endometrium  Thickness: 6.4 mm.  No focal abnormality visualized.  Right ovary  Measurements: 3.3 x 2.8 x 2.9 cm. Normal appearance/no adnexal mass.  Left ovary  Measurements: 3.1 x 1.5 x 2.0 cm. Normal appearance/no adnexal mass.  Other findings  No free fluid. Targeted ultrasound of the perineum in the area of patient's pain was without evidence of fluid collection.  IMPRESSION: Negative.   Electronically Signed   By: Logan Bores   On: 02/16/2014 17:57   US Pelvis Complete  02/16/2014   CLINICAL DATA:  Vaginal pain.  EXAM: TRANSABDOMINAL AND TRANSVAGINAL ULTRASOUND OF PELVIS  TECHNIQUE: Both transabdominal and transvaginal ultrasound examinations of the pelvis were performed. Transabdominal technique was performed for global imaging of the pelvis including uterus, ovaries, adnexal regions, and pelvic cul-de-sac. It was necessary to proceed with endovaginal exam following the transabdominal exam to visualize the uterus, endometrium, and ovaries.  COMPARISON:  CT  abdomen and pelvis 02/16/2014 and pelvic ultrasound 05/01/2007  FINDINGS: Uterus  Measurements: 9.6 x 4.3 x 5.6 cm. No fibroids or other mass visualized.  Endometrium  Thickness: 6.4 mm.  No focal abnormality visualized.  Right ovary  Measurements: 3.3 x 2.8 x 2.9 cm. Normal appearance/no adnexal mass.  Left ovary  Measurements: 3.1 x 1.5 x 2.0 cm. Normal appearance/no adnexal mass.  Other findings  No free fluid. Targeted ultrasound of the perineum in the area of patient's pain was without evidence of fluid collection.  IMPRESSION: Negative.   Electronically Signed   By: Logan Bores   On: 02/16/2014 17:57   Ct Abdomen Pelvis W Contrast  02/16/2014   CLINICAL DATA:  Worsening rectal pain.  EXAM: CT ABDOMEN AND PELVIS WITH CONTRAST  TECHNIQUE: Multidetector CT imaging of the abdomen and pelvis was performed using the standard protocol following bolus administration of intravenous contrast.  CONTRAST:  133m OMNIPAQUE IOHEXOL 300 MG/ML SOLN, 544mOMNIPAQUE IOHEXOL 300 MG/ML SOLN  COMPARISON:  None  FINDINGS: The visualized lung bases are clear. There is no pleural effusion. Bilateral breast implants are partially visualized.  The liver, gallbladder, spleen small adrenal glands, kidneys, and pancreas have an unremarkable enhanced appearance.  Contrast is present in nondilated loops of small bowel without evidence  of obstruction. Colon is nondilated. The appendix is identified in the right lower quadrant and is unremarkable. Rectum is mildly distended with gas without evidence of wall thickening or perirectal fluid collection/abscess.  Bladder is unremarkable. Uterus and ovaries are grossly unremarkable. Trace free fluid is present in the pelvis. Small sclerotic foci in the L1 vertebral body and posterior right ilium likely represent bone islands. Small Schmorl's nodes are noted in the lower thoracic spine.  IMPRESSION: No etiology of rectal pain identified.  Trace pelvic free fluid.   Electronically Signed   By:  Logan Bores   On: 02/16/2014 13:05    Review of Systems  Constitutional: Negative for fever and chills.  Respiratory: Negative.   Cardiovascular: Negative.   Gastrointestinal: Positive for constipation. Negative for heartburn, nausea, vomiting, abdominal pain, diarrhea, blood in stool and melena.  Psychiatric/Behavioral: The patient is nervous/anxious.     Blood pressure 100/63, pulse 72, temperature 98.3 F (36.8 C), temperature source Oral, resp. rate 16, last menstrual period 01/30/2014, SpO2 98.00%. Physical Exam  General: Well-developed Caucasian female who appears uncomfortable Skin: No rash or infection Lungs: No wheezing or increased work of breathing Cardiac: Regular rate and rhythm Abdomen: Soft and nontender Perineum: No external abnormalities.  There is mild tenderness to pressure just to the left and anterior to the anus but I cannot feel a mass in the perineum and no erythema or swelling. Vaginal exam: There is some mild to moderate tenderness toward the rectovaginal septum and laterally but no mass or other abnormality. Rectal: Mild increased rectal tone. No generalized tenderness to rectal exam. However 3-4 cm proximal to the anus in the left anterior position at about 2:00 is what feels to be a small nodule or area of swelling that is markedly tender consistently in this area without really any other significant rectal tenderness. No blood.  Assessment/Plan Persistent and worsening rectal/perineal pain.  This has become severe.  Laboratory and imaging as above is negative for abscess or other abnormality. She however has steadily worsening pain and does have a specific area of tenderness and possible small mass or swelling in the left anterolateral perirectal space. Her symptoms are not tolerable without pain medication. I've recommended bringing her into the hospital and continuing IV antibiotics. We will reassess in the morning and unless there is significant improvement I  would recommend proceeding with examination under anesthesia. This was discussed with the patient and her husband who are in agreement.  Darene Lamer Noemi Ishmael 02/16/2014, 7:15 PM

## 2014-02-17 ENCOUNTER — Encounter (HOSPITAL_COMMUNITY): Payer: Self-pay | Admitting: Anesthesiology

## 2014-02-17 ENCOUNTER — Encounter (HOSPITAL_COMMUNITY): Payer: BC Managed Care – PPO | Admitting: Anesthesiology

## 2014-02-17 ENCOUNTER — Inpatient Hospital Stay (HOSPITAL_COMMUNITY): Payer: BC Managed Care – PPO | Admitting: Anesthesiology

## 2014-02-17 ENCOUNTER — Encounter (HOSPITAL_COMMUNITY): Admission: EM | Disposition: A | Discharge status: Home / Self Care | Payer: Self-pay | Source: Home / Self Care

## 2014-02-17 HISTORY — PX: EXAMINATION UNDER ANESTHESIA: SHX1540

## 2014-02-17 LAB — CBC
HCT: 36.3 % (ref 36.0–46.0)
Hemoglobin: 12.2 g/dL (ref 12.0–15.0)
MCH: 29.8 pg (ref 26.0–34.0)
MCHC: 33.6 g/dL (ref 30.0–36.0)
MCV: 88.8 fL (ref 78.0–100.0)
PLATELETS: 184 10*3/uL (ref 150–400)
RBC: 4.09 MIL/uL (ref 3.87–5.11)
RDW: 13 % (ref 11.5–15.5)
WBC: 4.6 10*3/uL (ref 4.0–10.5)

## 2014-02-17 LAB — SURGICAL PCR SCREEN
MRSA, PCR: NEGATIVE
Staphylococcus aureus: NEGATIVE

## 2014-02-17 SURGERY — EXAM UNDER ANESTHESIA
Anesthesia: General | Site: Buttocks

## 2014-02-17 MED ORDER — HYDROMORPHONE HCL PF 1 MG/ML IJ SOLN
0.5000 mg | INTRAMUSCULAR | Status: DC | PRN
  Administered 2014-02-17 – 2014-02-19 (×10): 1 mg via INTRAVENOUS
  Filled 2014-02-17 (×10): qty 1

## 2014-02-17 MED ORDER — FENTANYL CITRATE 0.05 MG/ML IJ SOLN
INTRAMUSCULAR | Status: DC | PRN
  Administered 2014-02-17: 100 ug via INTRAVENOUS

## 2014-02-17 MED ORDER — HYDROCODONE-ACETAMINOPHEN 5-325 MG PO TABS
1.0000 | ORAL_TABLET | ORAL | Status: DC | PRN
  Administered 2014-02-18 – 2014-02-19 (×4): 2 via ORAL
  Filled 2014-02-17 (×5): qty 2

## 2014-02-17 MED ORDER — KCL IN DEXTROSE-NACL 20-5-0.9 MEQ/L-%-% IV SOLN
INTRAVENOUS | Status: AC
  Filled 2014-02-17: qty 1000

## 2014-02-17 MED ORDER — BUPIVACAINE HCL (PF) 0.25 % IJ SOLN
INTRAMUSCULAR | Status: AC
  Filled 2014-02-17: qty 30

## 2014-02-17 MED ORDER — DIPHENHYDRAMINE HCL 25 MG PO CAPS
25.0000 mg | ORAL_CAPSULE | Freq: Every evening | ORAL | Status: DC | PRN
  Administered 2014-02-17 – 2014-02-18 (×2): 25 mg via ORAL
  Filled 2014-02-17 (×2): qty 1

## 2014-02-17 MED ORDER — HYDROMORPHONE HCL PF 1 MG/ML IJ SOLN
INTRAMUSCULAR | Status: AC
  Filled 2014-02-17: qty 1

## 2014-02-17 MED ORDER — LACTATED RINGERS IV SOLN
INTRAVENOUS | Status: DC | PRN
  Administered 2014-02-17: 15:00:00 via INTRAVENOUS

## 2014-02-17 MED ORDER — BUPIVACAINE HCL (PF) 0.25 % IJ SOLN
INTRAMUSCULAR | Status: DC | PRN
  Administered 2014-02-17: 17 mL

## 2014-02-17 MED ORDER — LIDOCAINE HCL (CARDIAC) 20 MG/ML IV SOLN
INTRAVENOUS | Status: AC
  Filled 2014-02-17: qty 5

## 2014-02-17 MED ORDER — SCOPOLAMINE 1 MG/3DAYS TD PT72
1.0000 | MEDICATED_PATCH | Freq: Once | TRANSDERMAL | Status: DC
  Filled 2014-02-17 (×2): qty 1

## 2014-02-17 MED ORDER — FENTANYL CITRATE 0.05 MG/ML IJ SOLN
INTRAMUSCULAR | Status: AC
  Filled 2014-02-17: qty 5

## 2014-02-17 MED ORDER — MEPERIDINE HCL 50 MG/ML IJ SOLN
6.2500 mg | INTRAMUSCULAR | Status: DC | PRN
  Administered 2014-02-17 (×2): 12.5 mg via INTRAVENOUS

## 2014-02-17 MED ORDER — PROMETHAZINE HCL 25 MG/ML IJ SOLN: 12.5000 mg | Freq: Once | INTRAMUSCULAR | Status: DC | PRN

## 2014-02-17 MED ORDER — PROPOFOL 10 MG/ML IV BOLUS
INTRAVENOUS | Status: DC | PRN
  Administered 2014-02-17: 20 mg via INTRAVENOUS
  Administered 2014-02-17: 150 mg via INTRAVENOUS

## 2014-02-17 MED ORDER — MIDAZOLAM HCL 2 MG/2ML IJ SOLN
INTRAMUSCULAR | Status: AC
  Filled 2014-02-17: qty 2

## 2014-02-17 MED ORDER — PROPOFOL 10 MG/ML IV BOLUS
INTRAVENOUS | Status: AC
  Filled 2014-02-17: qty 20

## 2014-02-17 MED ORDER — MEPERIDINE HCL 50 MG/ML IJ SOLN
INTRAMUSCULAR | Status: AC
  Filled 2014-02-17: qty 1

## 2014-02-17 MED ORDER — MIDAZOLAM HCL 2 MG/2ML IJ SOLN
INTRAMUSCULAR | Status: DC | PRN
Start: 2014-02-17 — End: 2014-02-17
  Administered 2014-02-17: 2 mg via INTRAVENOUS

## 2014-02-17 MED ORDER — SCOPOLAMINE 1 MG/3DAYS TD PT72
MEDICATED_PATCH | TRANSDERMAL | Status: DC | PRN
  Administered 2014-02-17: 1 via TRANSDERMAL

## 2014-02-17 MED ORDER — HEPARIN SODIUM (PORCINE) 5000 UNIT/ML IJ SOLN
5000.0000 [IU] | Freq: Three times a day (TID) | INTRAMUSCULAR | Status: DC
  Administered 2014-02-18 – 2014-02-19 (×4): 5000 [IU] via SUBCUTANEOUS
  Filled 2014-02-17 (×7): qty 1

## 2014-02-17 MED ORDER — LIDOCAINE HCL (CARDIAC) 20 MG/ML IV SOLN
INTRAVENOUS | Status: DC | PRN
  Administered 2014-02-17: 50 mg via INTRAVENOUS

## 2014-02-17 MED ORDER — HYDROMORPHONE HCL PF 1 MG/ML IJ SOLN
0.2500 mg | INTRAMUSCULAR | Status: DC | PRN
  Administered 2014-02-17 (×2): 0.5 mg via INTRAVENOUS

## 2014-02-17 SURGICAL SUPPLY — 42 items
BLADE HEX COATED 2.75 (ELECTRODE) IMPLANT
BLADE SURG 15 STRL LF DISP TIS (BLADE) IMPLANT
BLADE SURG 15 STRL SS (BLADE)
CANNULA VESSEL W/WING WO/VALVE (CANNULA) ×3 IMPLANT
DECANTER SPIKE VIAL GLASS SM (MISCELLANEOUS) ×3 IMPLANT
DRAPE LG THREE QUARTER DISP (DRAPES) ×3 IMPLANT
DRSG PAD ABDOMINAL 8X10 ST (GAUZE/BANDAGES/DRESSINGS) IMPLANT
ELECT REM PT RETURN 9FT ADLT (ELECTROSURGICAL) ×3
ELECTRODE REM PT RTRN 9FT ADLT (ELECTROSURGICAL) ×1 IMPLANT
GAUZE PACKING IODOFORM 1/2 (PACKING) ×2 IMPLANT
GAUZE PACKING IODOFORM 1/4X15 (GAUZE/BANDAGES/DRESSINGS) ×2 IMPLANT
GAUZE SPONGE 4X4 16PLY XRAY LF (GAUZE/BANDAGES/DRESSINGS) ×3 IMPLANT
GLOVE BIOGEL PI IND STRL 7.0 (GLOVE) ×1 IMPLANT
GLOVE BIOGEL PI INDICATOR 7.0 (GLOVE) ×2
GLOVE SURG SIGNA 7.5 PF LTX (GLOVE) ×3 IMPLANT
GOWN STRL REUS W/TWL LRG LVL3 (GOWN DISPOSABLE) ×3 IMPLANT
GOWN STRL REUS W/TWL XL LVL3 (GOWN DISPOSABLE) ×6 IMPLANT
KIT BASIN OR (CUSTOM PROCEDURE TRAY) ×3 IMPLANT
LUBRICANT JELLY K Y 4OZ (MISCELLANEOUS) ×5 IMPLANT
LUBRICANT JELLY ST 5GR 8946 (MISCELLANEOUS) IMPLANT
NDL HYPO 25X1 1.5 SAFETY (NEEDLE) IMPLANT
NDL SAFETY ECLIPSE 18X1.5 (NEEDLE) IMPLANT
NEEDLE HYPO 18GX1.5 SHARP (NEEDLE)
NEEDLE HYPO 25X1 1.5 SAFETY (NEEDLE) IMPLANT
NS IRRIG 1000ML POUR BTL (IV SOLUTION) ×3 IMPLANT
PACK LITHOTOMY IV (CUSTOM PROCEDURE TRAY) ×3 IMPLANT
PENCIL BUTTON HOLSTER BLD 10FT (ELECTRODE) IMPLANT
SPONGE GAUZE 4X4 12PLY (GAUZE/BANDAGES/DRESSINGS) ×3 IMPLANT
SPONGE SURGIFOAM ABS GEL 100 (HEMOSTASIS) ×3 IMPLANT
SUT CHROMIC 2 0 SH (SUTURE) IMPLANT
SUT CHROMIC 3 0 SH 27 (SUTURE) IMPLANT
SYR 50ML LL SCALE MARK (SYRINGE) ×3 IMPLANT
SYR BULB IRRIGATION 50ML (SYRINGE) IMPLANT
SYR CONTROL 10ML LL (SYRINGE) ×3 IMPLANT
TAPE CLOTH SURG 6X10 WHT LF (GAUZE/BANDAGES/DRESSINGS) ×2 IMPLANT
TOWEL OR 17X26 10 PK STRL BLUE (TOWEL DISPOSABLE) ×3 IMPLANT
TRAP SPECIMEN MUCOUS 40CC (MISCELLANEOUS) IMPLANT
TUBING CONNECTING 10 (TUBING) ×2 IMPLANT
TUBING CONNECTING 10' (TUBING) ×1
UNDERPAD 30X30 INCONTINENT (UNDERPADS AND DIAPERS) ×7 IMPLANT
WATER STERILE IRR 1500ML POUR (IV SOLUTION) ×3 IMPLANT
YANKAUER SUCT BULB TIP 10FT TU (MISCELLANEOUS) ×3 IMPLANT

## 2014-02-17 NOTE — Progress Notes (Signed)
General Surgery Note  LOS: 1 day  POD -     Assessment/Plan: 1.  Rectal pain - unclear etiology  CT of abdomen/pelvis and Vaginal [place] are negative.  Cipro/Flagyl - 02/15/2014 >>>   WBC - 4,600 - 02/17/2014  Left perineal "mass" and pain - unclear of diagnosis.  I'm not sure that this is rectal/GI related.  Questionably related to vagina/gyn.  (She sees Dr. Katharine Look Rivard from GYN)  Plan EUA and sigmoidoscopy later today.  I discussed the indications and complications of surgery with the patient.  Risks include bleeding, infection, nerve injury, and failure to find anything or control her pain.   2.  DVT prophylaxis - SQ Heparin 3.  Small cyst right buttocks - 1.0 cm   Active Problems:   Rectal pain   Subjective:  Still hurts.  The pain meds have helped some, but overall she is still hurting the same and it is not getting better.  Objective:   Filed Vitals:   02/17/14 0500  BP: 96/61  Pulse: 81  Temp: 97.7 F (36.5 C)  Resp: 18     Intake/Output from previous day:  04/25 0701 - 04/26 0700 In: 1223.3 [I.V.:923.3; IV Piggyback:300] Out: 150 [Urine:150]  Intake/Output this shift:      Physical Exam:   General: WN WF who is alert and oriented.    HEENT: Normal. Pupils equal. .   Lungs: Clear   Abdomen: Soft   Perineum/vagina/rectum - I can feel a 1.5 "mass" that she is pointing to.  The is in the left perineum, between the rectum and vagina, though I think I can feel it better through the vagina.  It is not very discrete and difficult to tell what it is.   Lab Results:    Recent Labs  02/16/14 1131 02/17/14 0524  WBC 4.4 4.6  HGB 13.4 12.2  HCT 37.5 36.3  PLT 202 184    BMET   Recent Labs  02/16/14 1131  NA 141  K 3.4*  CL 107  CO2 20  GLUCOSE 86  BUN 9  CREATININE 0.70  CALCIUM 8.8    PT/INR  No results found for this basename: LABPROT, INR,  in the last 72 hours  ABG  No results found for this basename: PHART, PCO2, PO2, HCO3,  in the last 72  hours   Studies/Results:  US Transvaginal Non-ob  02/16/2014   CLINICAL DATA:  Vaginal pain.  EXAM: TRANSABDOMINAL AND TRANSVAGINAL ULTRASOUND OF PELVIS  TECHNIQUE: Both transabdominal and transvaginal ultrasound examinations of the pelvis were performed. Transabdominal technique was performed for global imaging of the pelvis including uterus, ovaries, adnexal regions, and pelvic cul-de-sac. It was necessary to proceed with endovaginal exam following the transabdominal exam to visualize the uterus, endometrium, and ovaries.  COMPARISON:  CT abdomen and pelvis 02/16/2014 and pelvic ultrasound 05/01/2007  FINDINGS: Uterus  Measurements: 9.6 x 4.3 x 5.6 cm. No fibroids or other mass visualized.  Endometrium  Thickness: 6.4 mm.  No focal abnormality visualized.  Right ovary  Measurements: 3.3 x 2.8 x 2.9 cm. Normal appearance/no adnexal mass.  Left ovary  Measurements: 3.1 x 1.5 x 2.0 cm. Normal appearance/no adnexal mass.  Other findings  No free fluid. Targeted ultrasound of the perineum in the area of patient's pain was without evidence of fluid collection.  IMPRESSION: Negative.   Electronically Signed   By: Logan Bores   On: 02/16/2014 17:57   US Pelvis Complete  02/16/2014   CLINICAL DATA:  Vaginal pain.  EXAM: TRANSABDOMINAL AND TRANSVAGINAL ULTRASOUND OF PELVIS  TECHNIQUE: Both transabdominal and transvaginal ultrasound examinations of the pelvis were performed. Transabdominal technique was performed for global imaging of the pelvis including uterus, ovaries, adnexal regions, and pelvic cul-de-sac. It was necessary to proceed with endovaginal exam following the transabdominal exam to visualize the uterus, endometrium, and ovaries.  COMPARISON:  CT abdomen and pelvis 02/16/2014 and pelvic ultrasound 05/01/2007  FINDINGS: Uterus  Measurements: 9.6 x 4.3 x 5.6 cm. No fibroids or other mass visualized.  Endometrium  Thickness: 6.4 mm.  No focal abnormality visualized.  Right ovary  Measurements: 3.3 x 2.8 x  2.9 cm. Normal appearance/no adnexal mass.  Left ovary  Measurements: 3.1 x 1.5 x 2.0 cm. Normal appearance/no adnexal mass.  Other findings  No free fluid. Targeted ultrasound of the perineum in the area of patient's pain was without evidence of fluid collection.  IMPRESSION: Negative.   Electronically Signed   By: Logan Bores   On: 02/16/2014 17:57   Ct Abdomen Pelvis W Contrast  02/16/2014   CLINICAL DATA:  Worsening rectal pain.  EXAM: CT ABDOMEN AND PELVIS WITH CONTRAST  TECHNIQUE: Multidetector CT imaging of the abdomen and pelvis was performed using the standard protocol following bolus administration of intravenous contrast.  CONTRAST:  119mL OMNIPAQUE IOHEXOL 300 MG/ML SOLN, 29mL OMNIPAQUE IOHEXOL 300 MG/ML SOLN  COMPARISON:  None  FINDINGS: The visualized lung bases are clear. There is no pleural effusion. Bilateral breast implants are partially visualized.  The liver, gallbladder, spleen small adrenal glands, kidneys, and pancreas have an unremarkable enhanced appearance.  Contrast is present in nondilated loops of small bowel without evidence of obstruction. Colon is nondilated. The appendix is identified in the right lower quadrant and is unremarkable. Rectum is mildly distended with gas without evidence of wall thickening or perirectal fluid collection/abscess.  Bladder is unremarkable. Uterus and ovaries are grossly unremarkable. Trace free fluid is present in the pelvis. Small sclerotic foci in the L1 vertebral body and posterior right ilium likely represent bone islands. Small Schmorl's nodes are noted in the lower thoracic spine.  IMPRESSION: No etiology of rectal pain identified.  Trace pelvic free fluid.   Electronically Signed   By: Logan Bores   On: 02/16/2014 13:05     Anti-infectives:   Anti-infectives   Start     Dose/Rate Route Frequency Ordered Stop   02/16/14 2200  ciprofloxacin (CIPRO) IVPB 400 mg     400 mg 200 mL/hr over 60 Minutes Intravenous Every 12 hours 02/16/14 2030      02/16/14 2200  metroNIDAZOLE (FLAGYL) IVPB 500 mg     500 mg 100 mL/hr over 60 Minutes Intravenous Every 8 hours 02/16/14 2030        Alphonsa Overall, MD, FACS Pager: [facility] Office: [phone] 02/17/2014

## 2014-02-17 NOTE — Transfer of Care (Signed)
Immediate Anesthesia Transfer of Care Note  Patient: Lindsay Giles  Procedure(s) Performed: Procedure(s): EXAM UNDER ANESTHESIA,  ULTRASOUND OF RECTUM, INCISION AND EXCISION OF THROMBOSED CLOT  (N/A)  Patient Location: PACU  Anesthesia Type:General  Level of Consciousness: awake, alert  and patient cooperative  Airway & Oxygen Therapy: Patient Spontanous Breathing and Patient connected to face mask oxygen  Post-op Assessment: Report given to PACU RN, Post -op Vital signs reviewed and stable and Patient moving all extremities X 4  Post vital signs: stable  Complications: No apparent anesthesia complications

## 2014-02-17 NOTE — Anesthesia Postprocedure Evaluation (Signed)
  Anesthesia Post-op Note  Patient: Lindsay Giles  Procedure(s) Performed: Procedure(s): EXAM UNDER ANESTHESIA,  ULTRASOUND OF RECTUM, INCISION AND EXCISION OF THROMBOSED CLOT  (N/A)  Patient Location: PACU  Anesthesia Type:General  Level of Consciousness: awake, alert  and oriented  Airway and Oxygen Therapy: Patient Spontanous Breathing  Post-op Pain: mild  Post-op Assessment: Post-op Vital signs reviewed, Patient's Cardiovascular Status Stable, Respiratory Function Stable, Patent Airway, No signs of Nausea or vomiting and Pain level controlled  Post-op Vital Signs: Reviewed and stable  Last Vitals:  Filed Vitals:   02/17/14 1650  BP:   Pulse: 81  Temp:   Resp: 13    Complications: No apparent anesthesia complications

## 2014-02-17 NOTE — Anesthesia Preprocedure Evaluation (Addendum)
Anesthesia Evaluation  Patient identified by MRN, date of birth, ID band Patient awake    Reviewed: Allergy & Precautions, H&P , NPO status , Patient's Chart, lab work & pertinent test results  History of Anesthesia Complications (+) PONV and history of anesthetic complications  Airway Mallampati: III TM Distance: >3 FB Neck ROM: Full    Dental no notable dental hx. (+) Teeth Intact   Pulmonary neg pulmonary ROS,  breath sounds clear to auscultation  Pulmonary exam normal       Cardiovascular negative cardio ROS  Rhythm:Regular Rate:Normal     Neuro/Psych  Headaches, negative psych ROS   GI/Hepatic Neg liver ROS, Perirectal Abscess   Endo/Other  negative endocrine ROS  Renal/GU negative Renal ROS     Musculoskeletal negative musculoskeletal ROS (+)   Abdominal   Peds  Hematology negative hematology ROS (+)   Anesthesia Other Findings   Reproductive/Obstetrics negative OB ROS                           Anesthesia Physical Anesthesia Plan  ASA: II and emergent  Anesthesia Plan: General   Post-op Pain Management:    Induction: Intravenous  Airway Management Planned: LMA  Additional Equipment:   Intra-op Plan:   Post-operative Plan: Extubation in OR  Informed Consent: I have reviewed the patients History and Physical, chart, labs and discussed the procedure including the risks, benefits and alternatives for the proposed anesthesia with the patient or authorized representative who has indicated his/her understanding and acceptance.   Dental advisory given  Plan Discussed with: CRNA, Anesthesiologist and Surgeon  Anesthesia Plan Comments:        Anesthesia Quick Evaluation

## 2014-02-17 NOTE — Op Note (Signed)
Lindsay Giles, Lindsay Giles             ACCOUNT NO.:  [id number]  MEDICAL RECORD NO.:  [id number]  LOCATION:  35                         FACILITY:  [facility]  PHYSICIAN:  Fenton Malling. Lucia Gaskins, M.D.  DATE OF BIRTH:  20-Jan-1976  DATE OF PROCEDURE:  02/17/2014                              OPERATIVE REPORT   PREOPERATIVE DIAGNOSIS:  Left perineal mass of unclear etiology.  POSTOPERATIVE DIAGNOSIS:  2 x 3 cm thrombus in the subcutaneous tissues of left perineum.  PROCEDURE:  Exam under anesthesia with rectal and perineal ultrasound, incision and drainage of subcutaneous thrombosis.  SURGEON:  Fenton Malling. Lucia Gaskins, M.D.  ASSISTANT:  No first assistant.  ANESTHESIA:  General LMA with 17 mL of 0.25% Marcaine.  COMPLICATIONS:  None.  INDICATION OF PROCEDURE:  Ms. Mashaw is a 38 year old white female, who sees Dr. Domenick Gong as her primary medical doctor.  She developed pain in her left perineum several days ago.  She originally saw Dr. Leonie Douglas on the evening of Thursday, April 23, and was placed on antibiotics.  She saw Dr. Neysa Bonito on Friday, April 24, who thought this may be a perineal infection.  He broadened her antibiotic coverage.  But the pain got worse and persisted.  She was admitted by Dr. Excell Seltzer on Saturday, April 25.  She has continued to have pain despite IV antibiotics.  Of note, she has had a normal white blood count, she has been afebrile, she has not been tachycardic, but has had persistent pain with a knot in her left perineum.  It wass unclear whether this was based in the vagina or the rectum, or unrelated either.  I discussed with her about proceeding with an exam under anesthesia and either drainage or removal of this mass.  Risks include bleeding, infection, nerve injury, and failure to resolve her pain.  DESCRIPTION OF PROCEDURE:  The patient was placed in the lithotomy position in room #1 at [facility].  Dr. Josephine Igo was the attending anesthesiologist.  She  was already on Cipro and Flagyl as an antibiotic.  A time-out was held and surgical checklist run.  I first did a rectal ultrasound, but could not this mass with the rectal [place] and saw no connection to the rectum.  I can feel this about 1.5 x 3 cm mass in her perineum, shaped like a "peanut", to the left of her rectum and vagina, but I see no connection to either vagina or rectum.  I then did ultrasound in the vaginal wall and in the perineum with 10 megahertz probe.  I could see this mass that had mixed echogenicity, but was discrete, that measured about 1.5 x 3 cm.  I tried aspirating this with a needle, but no purulence or fluid out.  I then made an incision directly over the mass and upon making the incision, I removed a 2 x 3 cm formed clot.  It did not look infected.  I did send it off for cultures and for pathology, though I expect both will be negative.  There was no residual mass.  I cannot explain why she has a thrombus in this location.  I then irrigated the wound, placed 1/4 inch  iodoform gauze in the wound.  I infiltrated the area with 17 mL of 0.25% Marcaine.   The patient tolerated the procedure well, was transported to the recovery room in good condition.  Sponge and needle count were correct at the end of the case.  It is unclear what caused this clot.  I have left her on antibiotics, but she should not need these more than a day or two.  I will keep her overnight because we finished late on Sunday.   Fenton Malling. Lucia Gaskins, M.D., FACS    DHN/MEDQ  D:  02/17/2014  T:  02/17/2014  Job:  [id number]  cc:   Haywood Pao, M.D. Fax: Oakman Rivard, M.D. Fax: [phone]  Dr. Leonie Douglas

## 2014-02-17 NOTE — ED Provider Notes (Signed)
Medical screening examination/treatment/procedure(s) were performed by non-physician practitioner and as supervising physician I was immediately available for consultation/collaboration.   EKG Interpretation None       Leota Jacobsen, MD 02/17/14 0730

## 2014-02-17 NOTE — Progress Notes (Signed)
Patient back from PACU, alert and oriented, C/O pain around the incision site, pain medication given in PACU prior to transfer to the unit. No active bleeding noted, will continue to monitor patient.

## 2014-02-18 ENCOUNTER — Encounter (HOSPITAL_COMMUNITY): Payer: Self-pay | Admitting: Surgery

## 2014-02-18 MED ORDER — HYDROCODONE-ACETAMINOPHEN 5-325 MG PO TABS: 1.0000 | ORAL_TABLET | ORAL | Status: DC | PRN

## 2014-02-18 MED ORDER — ENSURE COMPLETE PO LIQD: 237.0000 mL | Freq: Two times a day (BID) | ORAL | Status: DC

## 2014-02-18 MED ORDER — DOCUSATE SODIUM 100 MG PO CAPS
100.0000 mg | ORAL_CAPSULE | Freq: Every day | ORAL | Status: DC
  Administered 2014-02-18 – 2014-02-19 (×2): 100 mg via ORAL
  Filled 2014-02-18 (×2): qty 1

## 2014-02-18 NOTE — Progress Notes (Signed)
INITIAL NUTRITION ASSESSMENT  Pt meets criteria for severe MALNUTRITION in the context of chronic illness as evidenced by <75% estimated energy intake with 17.6% weight loss in the past 2 months per pt report.  DOCUMENTATION CODES Per approved criteria  -Severe malnutrition in the context of chronic illness   INTERVENTION: - Ensure Complete BID - Will add gluten/dairy restriction to diet - Will continue to monitor   NUTRITION DIAGNOSIS: Unintended weight loss related to ongoing stomach issues (including nausea, constipation) as evidenced by pt report.   Goal: Pt to consume >90% of meals/supplements  Monitor:  Weights, labs, intake   Reason for Assessment: Malnutrition screening tool   38 y.o. female  Admitting Dx: Severe rectal/perineal pain   ASSESSMENT: Pt is a generally healthy 38 year old female who approximately 5 days ago developed the onset of rectal or perineal pain which she feels just to the left of her anus. She has some history of chronic constipation but no previous similar symptoms. Has been followed by gastroenterologlist Dr. Oletta Lamas. Had ultrasound of rectum with incision and excision of thrombosed clot yesterday.   Met with pt who reports the bowl of grits she ate for breakfast this morning was the first thing she's had to eat since Friday. Was NPO since then in preparation for possible surgery. Before then she would typically eat 2 meals/day. Reports hx of stomach problems and has been on gluten/dairy free diet since Easter of this year as recommended by her doctor. Reports 30 pound unintended weight loss since then. Takes probiotics and digestive enzymes before meals which she reports has helped her stomach problems. C/o some nausea earlier today but states it has resolved.   Potassium low, getting replacement in IVF   Height: Ht Readings from Last 1 Encounters:  02/16/14 '5\' 6"'  (1.676 m)    Weight: Wt Readings from Last 1 Encounters:  02/16/14 140 lb  (63.504 kg)    Ideal Body Weight: 130 lbs  % Ideal Body Weight: 108%  Wt Readings from Last 10 Encounters:  02/16/14 140 lb (63.504 kg)  02/16/14 140 lb (63.504 kg)  02/15/14 140 lb 9.6 oz (63.776 kg)  10/11/13 155 lb (70.308 kg)  07/27/12 149 lb (67.586 kg)    Usual Body Weight: 170 lbs per pt  % Usual Body Weight: 82%  BMI:  Body mass index is 22.61 kg/(m^2).  Estimated Nutritional Needs: Kcal: 1600-1800 Protein: 75-95g Fluid: 1.6-1.8L/day  Skin: Left buttocks incision   Diet Order: General  EDUCATION NEEDS: -No education needs identified at this time   Intake/Output Summary (Last 24 hours) at 02/18/14 1153 Last data filed at 02/18/14 0659  Gross per 24 hour  Intake 3413.34 ml  Output   3075 ml  Net 338.34 ml    Last BM: PTA  Labs:   Recent Labs Lab 02/16/14 1131  NA 141  K 3.4*  CL 107  CO2 20  BUN 9  CREATININE 0.70  CALCIUM 8.8  GLUCOSE 86    CBG (last 3)  No results found for this basename: GLUCAP,  in the last 72 hours  Scheduled Meds: . docusate sodium  100 mg Oral Daily  . heparin  5,000 Units Subcutaneous 3 times per day  . metronidazole  500 mg Intravenous Q8H    Continuous Infusions: . dextrose 5 % and 0.9 % NaCl with KCl 20 mEq/L 100 mL/hr at 02/18/14 1141    Past Medical History  Diagnosis Date  . Abnormal Pap smear   . Menorrhagia  Past Surgical History  Procedure Laterality Date  . Tonsillectomy and adenoidectomy    . Dilation and curettage of uterus      Mikey College MS, RD, LDN [phone] Pager [phone] After Hours Pager

## 2014-02-18 NOTE — Clinical Documentation Improvement (Signed)
Possible Clinical Conditions? Severe Malnutrition   Protein Calorie Malnutrition Severe Protein Calorie Malnutrition Other Condition Cannot clinically determine  Supporting Information: NUTRITIONAL ASSESSMENT by Christie Beckers, RD at 02/18/2014 11:53 AM Signs & Symptoms:Unintended weight loss related to ongoing stomach issues (including nausea, constipation) as evidenced by pt report.  Diagnostics: Pt meets criteria for severe MALNUTRITION in the context of chronic illness as evidenced by <75% estimated energy intake with 17.6% weight loss in the past 2 months per pt report. DOCUMENTATION CODES  Per approved criteria   -Severe malnutrition in the context of chronic illness    INTERVENTION: - Ensure Complete BID - Will add gluten/dairy restriction to diet - Will continue to monitor   Thank You, Alessandra Grout, RN, BSN, CCDS, Clinical Documentation Specialist:  [phone]   [phone]=[facility]

## 2014-02-18 NOTE — Progress Notes (Signed)
General Surgery [facility]  Patient seen.  Mild nausea.  Trying to take po pain Rx.  Family to bring food.  Home either later today or tomorrow.  Earnstine Regal, MD, [facility] Office: [phone]

## 2014-02-18 NOTE — Progress Notes (Signed)
Patient ID: Lindsay Giles, female   DOB: 1975-11-10, 38 y.o.   MRN: [id number] 1 Day Post-Op  Subjective: Pt feels ok today.  Still with some pain, but pre-op pain improved  Objective: Vital signs in last 24 hours: Temp:  [96.7 F (35.9 C)-98.3 F (36.8 C)] 98.1 F (36.7 C) (04/27 0932) Pulse Rate:  [66-95] 84 (04/27 0614) Resp:  [10-18] 18 (04/27 0614) BP: (95-121)/(63-81) 95/63 mmHg (04/27 0614) SpO2:  [98 %-100 %] 98 % (04/27 0614)    Intake/Output from previous day: 04/26 0701 - 04/27 0700 In: 3613.3 [P.O.:240; I.V.:2873.3; IV Piggyback:500] Out: 3875 [Urine:3875] Intake/Output this shift:    PE: GU: wound is clean and packing removed.  Still tender, but no erythema or infection noted  Lab Results:   Recent Labs  02/16/14 1131 02/17/14 0524  WBC 4.4 4.6  HGB 13.4 12.2  HCT 37.5 36.3  PLT 202 184   BMET  Recent Labs  02/16/14 1131  NA 141  K 3.4*  CL 107  CO2 20  GLUCOSE 86  BUN 9  CREATININE 0.70  CALCIUM 8.8   PT/INR No results found for this basename: LABPROT, INR,  in the last 72 hours CMP     Component Value Date/Time   NA 141 02/16/2014 1131   K 3.4* 02/16/2014 1131   CL 107 02/16/2014 1131   CO2 20 02/16/2014 1131   GLUCOSE 86 02/16/2014 1131   BUN 9 02/16/2014 1131   CREATININE 0.70 02/16/2014 1131   CALCIUM 8.8 02/16/2014 1131   GFRNONAA >90 02/16/2014 1131   GFRAA >90 02/16/2014 1131   Lipase  No results found for this basename: lipase       Studies/Results: US Transvaginal Non-ob  02/16/2014   CLINICAL DATA:  Vaginal pain.  EXAM: TRANSABDOMINAL AND TRANSVAGINAL ULTRASOUND OF PELVIS  TECHNIQUE: Both transabdominal and transvaginal ultrasound examinations of the pelvis were performed. Transabdominal technique was performed for global imaging of the pelvis including uterus, ovaries, adnexal regions, and pelvic cul-de-sac. It was necessary to proceed with endovaginal exam following the transabdominal exam to visualize the uterus,  endometrium, and ovaries.  COMPARISON:  CT abdomen and pelvis 02/16/2014 and pelvic ultrasound 05/01/2007  FINDINGS: Uterus  Measurements: 9.6 x 4.3 x 5.6 cm. No fibroids or other mass visualized.  Endometrium  Thickness: 6.4 mm.  No focal abnormality visualized.  Right ovary  Measurements: 3.3 x 2.8 x 2.9 cm. Normal appearance/no adnexal mass.  Left ovary  Measurements: 3.1 x 1.5 x 2.0 cm. Normal appearance/no adnexal mass.  Other findings  No free fluid. Targeted ultrasound of the perineum in the area of patient's pain was without evidence of fluid collection.  IMPRESSION: Negative.   Electronically Signed   By: Logan Bores   On: 02/16/2014 17:57   US Pelvis Complete  02/16/2014   CLINICAL DATA:  Vaginal pain.  EXAM: TRANSABDOMINAL AND TRANSVAGINAL ULTRASOUND OF PELVIS  TECHNIQUE: Both transabdominal and transvaginal ultrasound examinations of the pelvis were performed. Transabdominal technique was performed for global imaging of the pelvis including uterus, ovaries, adnexal regions, and pelvic cul-de-sac. It was necessary to proceed with endovaginal exam following the transabdominal exam to visualize the uterus, endometrium, and ovaries.  COMPARISON:  CT abdomen and pelvis 02/16/2014 and pelvic ultrasound 05/01/2007  FINDINGS: Uterus  Measurements: 9.6 x 4.3 x 5.6 cm. No fibroids or other mass visualized.  Endometrium  Thickness: 6.4 mm.  No focal abnormality visualized.  Right ovary  Measurements: 3.3 x 2.8 x 2.9 cm. Normal  appearance/no adnexal mass.  Left ovary  Measurements: 3.1 x 1.5 x 2.0 cm. Normal appearance/no adnexal mass.  Other findings  No free fluid. Targeted ultrasound of the perineum in the area of patient's pain was without evidence of fluid collection.  IMPRESSION: Negative.   Electronically Signed   By: Logan Bores   On: 02/16/2014 17:57   Ct Abdomen Pelvis W Contrast  02/16/2014   CLINICAL DATA:  Worsening rectal pain.  EXAM: CT ABDOMEN AND PELVIS WITH CONTRAST  TECHNIQUE:  Multidetector CT imaging of the abdomen and pelvis was performed using the standard protocol following bolus administration of intravenous contrast.  CONTRAST:  153mL OMNIPAQUE IOHEXOL 300 MG/ML SOLN, 68mL OMNIPAQUE IOHEXOL 300 MG/ML SOLN  COMPARISON:  None  FINDINGS: The visualized lung bases are clear. There is no pleural effusion. Bilateral breast implants are partially visualized.  The liver, gallbladder, spleen small adrenal glands, kidneys, and pancreas have an unremarkable enhanced appearance.  Contrast is present in nondilated loops of small bowel without evidence of obstruction. Colon is nondilated. The appendix is identified in the right lower quadrant and is unremarkable. Rectum is mildly distended with gas without evidence of wall thickening or perirectal fluid collection/abscess.  Bladder is unremarkable. Uterus and ovaries are grossly unremarkable. Trace free fluid is present in the pelvis. Small sclerotic foci in the L1 vertebral body and posterior right ilium likely represent bone islands. Small Schmorl's nodes are noted in the lower thoracic spine.  IMPRESSION: No etiology of rectal pain identified.  Trace pelvic free fluid.   Electronically Signed   By: Logan Bores   On: 02/16/2014 13:05    Anti-infectives: Anti-infectives   Start     Dose/Rate Route Frequency Ordered Stop   02/16/14 2200  ciprofloxacin (CIPRO) IVPB 400 mg  Status:  Discontinued     400 mg 200 mL/hr over 60 Minutes Intravenous Every 12 hours 02/16/14 2030 02/17/14 1744   02/16/14 2200  metroNIDAZOLE (FLAGYL) IVPB 500 mg     500 mg 100 mL/hr over 60 Minutes Intravenous Every 8 hours 02/16/14 2030 02/18/14 2359       Assessment/Plan  1. POD 1, s/p I&D of perineal thrombus  Plan: 1. Packing pulled today.  Will stop abx therapy today 2. If pain controlled will plan for dc home this afternoon, if not, plan for tomorrow. 3. Sitz bathes BID and after each BM   LOS: 2 days    Henreitta Cea 02/18/2014, 10:17  AM Pager: [phone]

## 2014-02-19 LAB — GC/CHLAMYDIA PROBE AMP
CT PROBE, AMP APTIMA: NEGATIVE
GC PROBE AMP APTIMA: NEGATIVE

## 2014-02-19 MED ORDER — POLYETHYLENE GLYCOL 3350 17 G PO PACK: 17.0000 g | PACK | Freq: Every day | ORAL | Status: DC | PRN

## 2014-02-19 MED ORDER — IBUPROFEN 400 MG PO TABS: 400.0000 mg | ORAL_TABLET | Freq: Four times a day (QID) | ORAL | Status: DC | PRN

## 2014-02-19 MED ORDER — IBUPROFEN 800 MG PO TABS: 400.0000 mg | ORAL_TABLET | Freq: Four times a day (QID) | ORAL | Status: DC | PRN

## 2014-02-19 MED ORDER — PSYLLIUM 95 % PO PACK
1.0000 | PACK | Freq: Every day | ORAL | Status: DC
  Filled 2014-02-19: qty 1

## 2014-02-19 MED ORDER — POLYETHYLENE GLYCOL 3350 17 G PO PACK
17.0000 g | PACK | Freq: Every day | ORAL | Status: DC
  Administered 2014-02-19: 17 g via ORAL
  Filled 2014-02-19: qty 1

## 2014-02-19 MED ORDER — PSYLLIUM 95 % PO PACK: 1.0000 | PACK | Freq: Every day | ORAL | Status: DC

## 2014-02-19 NOTE — Discharge Instructions (Signed)
Continue taking stool softeners as needed  [employer] - twice a day and after each bowel movement A sitz bath is a warm water bath taken in the sitting position that covers only the hips and buttocks. It may be used for either healing or hygiene purposes. Sitz baths are also used to relieve pain, itching, or muscle spasms. The water may contain medicine. Moist heat will help you heal and relax.  HOME CARE INSTRUCTIONS  Take 3 to 4 sitz baths a day. 1. Fill the bathtub half full with warm water. 2. Sit in the water and open the drain a little. 3. Turn on the warm water to keep the tub half full. Keep the water running constantly. 4. Soak in the water for 15 to 20 minutes. 5. After the sitz bath, pat the affected area dry first. SEEK MEDICAL CARE IF:  You get worse instead of better. Stop the sitz baths if you get worse. MAKE SURE YOU:  Understand these instructions.  Will watch your condition.  Will get help right away if you are not doing well or get worse. Document Released: 07/03/2004 Document Revised: 07/05/2012 Document Reviewed: 01/08/2011 [employer] Patient Information 2014 [place], [place]. Peri-Rectal Thrombus Your caregiver has diagnosed you as having a peri-rectal abscess. This is an infected area near the rectum that is filled with pus. If the abscess is near the surface of the skin, your caregiver may open (incise) the area and drain the pus. HOME CARE INSTRUCTIONS   If your thrombus was opened up and drained. A small piece of gauze may be placed in the opening so that it can drain. Do not remove the gauze unless directed by your caregiver.  A loose dressing may be placed over the abscess site. Change the dressing as often as necessary to keep it clean and dry.  After the drain is removed, the area may be washed with a gentle antiseptic (soap) four times per day.  A warm sitz bath, warm packs or heating pad may be used for pain relief, taking care not to burn  yourself.  Return for a wound check in 1 day or as directed.  An "inflatable doughnut" may be used for sitting with added comfort. These can be purchased at a drugstore or medical supply house.  To reduce pain and straining with bowel movements, eat a high fiber diet with plenty of fruits and vegetables. Use stool softeners as recommended by your caregiver. This is especially important if narcotic type pain medications were prescribed as these may cause marked constipation.  Only take over-the-counter or prescription medicines for pain, discomfort, or fever as directed by your caregiver. SEEK IMMEDIATE MEDICAL CARE IF:   You have increasing pain that is not controlled by medication.  There is increased inflammation (redness), swelling, bleeding, or drainage from the area.  An oral temperature above 102 F (38.9 C) develops.  You develop chills or generalized malaise (feel lethargic or feel "washed out").  You develop any new symptoms (problems) you feel may be related to your present problem. Document Released: 10/08/2000 Document Revised: 01/03/2012 Document Reviewed: 10/08/2008 [facility] Patient Information 2014 [place].

## 2014-02-19 NOTE — Progress Notes (Signed)
2 Days Post-Op  Subjective: She ate for the first time this Am.  She has chronic constipation issues.  She is on a gluten and dairy free diet trying to work out some issues.  She had some nausea yesterday, but she isn't sure why.  Site looks fine. Objective: Vital signs in last 24 hours: Temp:  [97.8 F (36.6 C)-98.9 F (37.2 C)] 98.7 F (37.1 C) (04/28 0545) Pulse Rate:  [66-88] 72 (04/28 0545) Resp:  [18] 18 (04/28 0545) BP: (93-110)/(57-70) 93/57 mmHg (04/28 0545) SpO2:  [98 %-99 %] 98 % (04/28 0545)  320 PO recorded. Gluten free diet Afebrile, VSS No labs  Intake/Output from previous day: 04/27 0701 - 04/28 0700 In: 2221.7 [P.O.:320; I.V.:1901.7] Out: 500 [Urine:500] Intake/Output this shift:    General appearance: alert, cooperative, no distress and somewhat anxious. Skin: thrombus site looks fine.  Lab Results:   Recent Labs  02/16/14 1131 02/17/14 0524  WBC 4.4 4.6  HGB 13.4 12.2  HCT 37.5 36.3  PLT 202 184    BMET  Recent Labs  02/16/14 1131  NA 141  K 3.4*  CL 107  CO2 20  GLUCOSE 86  BUN 9  CREATININE 0.70  CALCIUM 8.8   PT/INR No results found for this basename: LABPROT, INR,  in the last 72 hours  No results found for this basename: AST, ALT, ALKPHOS, BILITOT, PROT, ALBUMIN,  in the last 168 hours   Lipase  No results found for this basename: lipase     Studies/Results: No results found.  Medications: . docusate sodium  100 mg Oral Daily  . feeding supplement (ENSURE COMPLETE)  237 mL Oral BID BM  . heparin  5,000 Units Subcutaneous 3 times per day   Prior to Admission medications   Medication Sig Start Date End Date Taking? Authorizing Provider  cholecalciferol (VITAMIN D) 1000 UNITS tablet Take 1,000 Units by mouth daily.   Yes Historical Provider, MD  ciprofloxacin (CIPRO) 500 MG tablet Take 1 tablet (500 mg total) by mouth 2 (two) times daily. 02/15/14  Yes Adin Hector, MD  desvenlafaxine (PRISTIQ) 100 MG 24 hr tablet Take  100 mg by mouth daily.   Yes Historical Provider, MD  HYDROcodone-acetaminophen (NORCO) 5-325 MG per tablet Take 1 tablet by mouth every 6 (six) hours as needed for moderate pain. 10/11/13  Yes Heather Elnora Morrison, PA-C  Melatonin 3 MG CAPS Take 3 mg by mouth at bedtime as needed (sleep).    Yes Historical Provider, MD  metroNIDAZOLE (FLAGYL) 500 MG tablet Take 500 mg by mouth 3 (three) times daily. 02/14/14 ten day supply   Yes Historical Provider, MD  promethazine (PHENERGAN) 25 MG tablet Take 1 tablet (25 mg total) by mouth every 8 (eight) hours as needed for nausea or vomiting. 10/11/13  Yes Collene Leyden, PA-C  topiramate (TOPAMAX) 25 MG tablet Take 25-125 mg by mouth 3 (three) times daily. 50mg  in the morning and 50 mg with lunch and 125mg  at bedtime   Yes Historical Provider, MD  HYDROcodone-acetaminophen (NORCO/VICODIN) 5-325 MG per tablet Take 1-2 tablets by mouth every 4 (four) hours as needed for moderate pain. 02/18/14   Henreitta Cea, PA-C    Assessment/Plan 1.  Rectal pain with 2 x 3 cm thrombus in the subcutaneous tissues of left perineum. S/p  Exam under anesthesia with rectal and perineal ultrasound, incision and drainage of subcutaneous thrombosis.  02/17/2014,  Shann Medal, MD 2.  Chronic constipation 3.  Anxiety  Plan:  I will give her a dose of Miralax, add NSAID, and get her up walking.  I recommended long term fiber for chronic constipation.  We will let her go home after lunch, and BM.  She can continue shower and sitz baths at home.    LOS: 3 days    Earnstine Regal 02/19/2014

## 2014-02-19 NOTE — Discharge Summary (Signed)
General Surgery [facility]  As noted in progress notes.  Home today.  Follow up at [facility] with Dr. Alphonsa Overall.  Earnstine Regal, MD, [facility] Office: [phone]

## 2014-02-19 NOTE — Discharge Summary (Signed)
Physician Discharge Summary  Patient ID: Lindsay Giles MRN: [id number] DOB/AGE: 1976-07-26 38 y.o.  Admit date: 02/16/2014 Discharge date: 02/19/2014  Admission Diagnoses:  1. Rectal pain of uncertain etiology  2. Chronic constipation  3. Anxiety      Discharge Diagnoses:  1. Rectal pain with 2 x 3 cm thrombus in the subcutaneous tissues of left perineum.  2. Chronic constipation  3. Anxiety     Active Problems:   Rectal pain   PROCEDURES:  S/p Exam under anesthesia with rectal and perineal ultrasound, incision and drainage of subcutaneous thrombosis. 02/17/2014, Shann Medal, MD    Hospital Course:  patient is a generally healthy 38 year old female who approximately 5 days ago developed the onset of rectal or perineal pain which she feels just to the left of her anus. She has some history of chronic constipation but no previous similar symptoms. No history of colitis or rectal abscess. Over the next couple of days the pain gradually intensified. She was seen by her GI physician Dr. Oletta Lamas and then referred to our office and saw Dr. Johney [place] on Friday. She had been started on oral antibiotics on Thursday, 2 days ago. Evaluation and examination in the office by Dr. Johney [place] on Friday questioned possibly a little fullness in the left perirectal area but was nonspecific. Continued antibiotics and observation was recommended and she was instructed to call should get any worse. This morning the pain was markedly worse. She describes constant aching pain which she feels just to the left of her rectum and just anterior to her rectum. She has been having some small loose bowel movements which did not affect the pain. No bleeding. No fever or chills. She feels like there is some fullness or a lump just to the left and anterior to her anus in the perineum. Last menstrual period was normal. No discharge or abnormal bleeding. She called earlier today and was advised to come to the emergency  department for evaluation due to her worsening symptoms.  She was admitted on 2/25 with ongoing pain and taken to the OR on 02/17/14 by Dr. Lucia Gaskins.  The findings are above.  Her pain has improved, she has chronic constipation issues and we discussed the treatment of this.  Her wound is fine and she was ready for d/c later in the day 02/19/14. She does not need antibiotics, and is to keep clean and treat with a sitz bath, showering at home.  She can go back to work when she is comfortable enough to work.  Condition on d/c:  Improved        Disposition: 01-Home or Self Care     Medication List    STOP taking these medications       ciprofloxacin 500 MG tablet  Commonly known as:  CIPRO     metroNIDAZOLE 500 MG tablet  Commonly known as:  FLAGYL      TAKE these medications       cholecalciferol 1000 UNITS tablet  Commonly known as:  VITAMIN D  Take 1,000 Units by mouth daily.     desvenlafaxine 100 MG 24 hr tablet  Commonly known as:  PRISTIQ  Take 100 mg by mouth daily.     HYDROcodone-acetaminophen 5-325 MG per tablet  Commonly known as:  NORCO  Take 1 tablet by mouth every 6 (six) hours as needed for moderate pain.     HYDROcodone-acetaminophen 5-325 MG per tablet  Commonly known as:  NORCO/VICODIN  Take 1-2 tablets by  mouth every 4 (four) hours as needed for moderate pain.     ibuprofen 400 MG tablet  Commonly known as:  ADVIL,MOTRIN  Take 1 tablet (400 mg total) by mouth every 6 (six) hours as needed for fever, headache, mild pain, moderate pain or cramping.     Melatonin 3 MG Caps  Take 3 mg by mouth at bedtime as needed (sleep).     polyethylene glycol packet  Commonly known as:  MIRALAX / GLYCOLAX  Take 17 g by mouth daily as needed (You can use this for acute constipation.).     promethazine 25 MG tablet  Commonly known as:  PHENERGAN  Take 1 tablet (25 mg total) by mouth every 8 (eight) hours as needed for nausea or vomiting.     psyllium 95 % Pack   Commonly known as:  HYDROCIL/METAMUCIL  Take 1 packet by mouth at bedtime.     topiramate 25 MG tablet  Commonly known as:  TOPAMAX  Take 25-125 mg by mouth 3 (three) times daily. 50mg  in the morning and 50 mg with lunch and 125mg  at bedtime           Follow-up Information   Follow up with [facility] H, MD. Schedule an appointment as soon as possible for a visit in 2 weeks.   Specialty:  General Surgery   Contact information:   [address] [place] [address] [phone]       Signed: Earnstine Regal 02/19/2014, 2:45 PM

## 2014-02-19 NOTE — Progress Notes (Signed)
General Surgery [facility]  Doing well post op.  Continue Sitz baths.  Home soon.  Earnstine Regal, MD, [facility] Office: [phone]

## 2014-02-21 LAB — CULTURE, ROUTINE-ABSCESS: Culture: NO GROWTH

## 2014-02-22 LAB — ANAEROBIC CULTURE

## 2014-02-28 ENCOUNTER — Other Ambulatory Visit: Payer: Self-pay | Admitting: Gastroenterology

## 2014-02-28 DIAGNOSIS — R7402 Elevation of levels of lactic acid dehydrogenase (LDH): Secondary | ICD-10-CM

## 2014-02-28 DIAGNOSIS — R740 Nonspecific elevation of levels of transaminase and lactic acid dehydrogenase [LDH]: Principal | ICD-10-CM

## 2014-03-05 ENCOUNTER — Ambulatory Visit
Admission: RE | Admit: 2014-03-05 | Discharge: 2014-03-05 | Disposition: A | Discharge status: Home / Self Care | Payer: BC Managed Care – PPO | Source: Ambulatory Visit | Attending: Gastroenterology | Admitting: Gastroenterology

## 2014-03-05 DIAGNOSIS — R7401 Elevation of levels of liver transaminase levels: Secondary | ICD-10-CM

## 2014-03-05 DIAGNOSIS — R740 Nonspecific elevation of levels of transaminase and lactic acid dehydrogenase [LDH]: Principal | ICD-10-CM

## 2014-03-06 ENCOUNTER — Encounter (INDEPENDENT_AMBULATORY_CARE_PROVIDER_SITE_OTHER): Payer: Self-pay | Admitting: Surgery

## 2014-03-06 ENCOUNTER — Ambulatory Visit (INDEPENDENT_AMBULATORY_CARE_PROVIDER_SITE_OTHER): Payer: BC Managed Care – PPO | Admitting: Surgery

## 2014-03-06 VITALS — BP 126/74 | HR 90 | Temp 98.4°F | Ht 66.0 in | Wt 134.0 lb

## 2014-03-06 DIAGNOSIS — K6289 Other specified diseases of anus and rectum: Secondary | ICD-10-CM

## 2014-03-06 NOTE — Progress Notes (Signed)
[place], MD,  [address]  [place], [place]    [place] Phone:  [phone] FAX:  [phone]   Re:   Lindsay Giles DOB:   1976-07-10 MRN:   [id number]  ASSESSMENT AND PLAN: 1.  Left perineal hematoma ("clot")  Path ([id number]) - benign blood clot/fibroconnective tissue.  Minimal discomfort.  Nothing more to do now.  I made her visit PRN.  She knows to call if there are further problems.  2.  1.0 cm cyst of right buttocks.  HISTORY OF PRESENT ILLNESS: Chief Complaint  Patient presents with  . Routine Post Op   Lindsay Giles is a 38 y.o. (DOB: [birth year])  white  female who is a patient of TISOVEC,RICHARD W, MD and comes to me today for follow up of left perineal surgery. She is doing much better.  Has maybe some twinges in her left perineum. I talked about the unknown cause of her diagnosis.  It is odd and unusual.  But I see no reason at this time for more test.  [Note:  She did talk to [facility] before seeing me.  She expressed to [facility] displeasure with Dr. Clyda Greener care of her.  We did not talk about this.]  Past Medical History  Diagnosis Date  . Abnormal Pap smear   . Menorrhagia    SOCIAL HISTORY: Daughter of Lindsay Giles.  PHYSICAL EXAM: BP 126/74  Pulse 90  Temp(Src) 98.4 F (36.9 C)  Ht 5\' 6"  (1.676 m)  Wt 134 lb (60.782 kg)  BMI 21.64 kg/m2  LMP 01/30/2014  Perineum - Scar is hard to see.  I did not repeat rectal or vaginal exam.  There is no inflammation.  DATA REVIEWED: Path to patient  Alphonsa Overall, MD,  [facility], [place]  [place], [place]    [place] Phone:  [phone] FAX:  [phone]

## 2014-03-10 ENCOUNTER — Encounter (HOSPITAL_COMMUNITY): Payer: Self-pay | Admitting: Emergency Medicine

## 2014-03-10 ENCOUNTER — Observation Stay (HOSPITAL_COMMUNITY)
Admission: EM | Admit: 2014-03-10 | Discharge: 2014-03-12 | Disposition: A | Discharge status: Home / Self Care | Payer: BC Managed Care – PPO | Attending: Internal Medicine | Admitting: Internal Medicine

## 2014-03-10 DIAGNOSIS — F329 Major depressive disorder, single episode, unspecified: Secondary | ICD-10-CM | POA: Insufficient documentation

## 2014-03-10 DIAGNOSIS — R209 Unspecified disturbances of skin sensation: Secondary | ICD-10-CM | POA: Insufficient documentation

## 2014-03-10 DIAGNOSIS — F32A Depression, unspecified: Secondary | ICD-10-CM | POA: Insufficient documentation

## 2014-03-10 DIAGNOSIS — R5381 Other malaise: Secondary | ICD-10-CM | POA: Insufficient documentation

## 2014-03-10 DIAGNOSIS — IMO0001 Reserved for inherently not codable concepts without codable children: Secondary | ICD-10-CM | POA: Insufficient documentation

## 2014-03-10 DIAGNOSIS — M2550 Pain in unspecified joint: Principal | ICD-10-CM | POA: Diagnosis present

## 2014-03-10 DIAGNOSIS — K2100 Gastro-esophageal reflux disease with esophagitis, without bleeding: Secondary | ICD-10-CM | POA: Insufficient documentation

## 2014-03-10 DIAGNOSIS — Z79899 Other long term (current) drug therapy: Secondary | ICD-10-CM | POA: Insufficient documentation

## 2014-03-10 DIAGNOSIS — A6923 Arthritis due to Lyme disease: Secondary | ICD-10-CM | POA: Diagnosis present

## 2014-03-10 DIAGNOSIS — R519 Headache, unspecified: Secondary | ICD-10-CM | POA: Insufficient documentation

## 2014-03-10 DIAGNOSIS — R51 Headache: Secondary | ICD-10-CM

## 2014-03-10 DIAGNOSIS — R5383 Other fatigue: Secondary | ICD-10-CM

## 2014-03-10 DIAGNOSIS — M791 Myalgia, unspecified site: Secondary | ICD-10-CM

## 2014-03-10 DIAGNOSIS — G43909 Migraine, unspecified, not intractable, without status migrainosus: Secondary | ICD-10-CM | POA: Insufficient documentation

## 2014-03-10 DIAGNOSIS — A6920 Lyme disease, unspecified: Secondary | ICD-10-CM

## 2014-03-10 DIAGNOSIS — R109 Unspecified abdominal pain: Secondary | ICD-10-CM | POA: Diagnosis present

## 2014-03-10 LAB — SEDIMENTATION RATE: SED RATE: 3 mm/h (ref 0–22)

## 2014-03-10 LAB — CBC WITH DIFFERENTIAL/PLATELET
BASOS ABS: 0 10*3/uL (ref 0.0–0.1)
BASOS PCT: 0 % (ref 0–1)
Eosinophils Absolute: 0.1 10*3/uL (ref 0.0–0.7)
Eosinophils Relative: 2 % (ref 0–5)
HEMATOCRIT: 34.9 % — AB (ref 36.0–46.0)
Hemoglobin: 12.1 g/dL (ref 12.0–15.0)
Lymphocytes Relative: 38 % (ref 12–46)
Lymphs Abs: 2.3 10*3/uL (ref 0.7–4.0)
MCH: 29.3 pg (ref 26.0–34.0)
MCHC: 34.7 g/dL (ref 30.0–36.0)
MCV: 84.5 fL (ref 78.0–100.0)
MONO ABS: 0.4 10*3/uL (ref 0.1–1.0)
Monocytes Relative: 7 % (ref 3–12)
Neutro Abs: 3.2 10*3/uL (ref 1.7–7.7)
Neutrophils Relative %: 53 % (ref 43–77)
Platelets: 263 10*3/uL (ref 150–400)
RBC: 4.13 MIL/uL (ref 3.87–5.11)
RDW: 12.5 % (ref 11.5–15.5)
WBC: 6 10*3/uL (ref 4.0–10.5)

## 2014-03-10 LAB — URINALYSIS, ROUTINE W REFLEX MICROSCOPIC
BILIRUBIN URINE: NEGATIVE
Glucose, UA: NEGATIVE mg/dL
Hgb urine dipstick: NEGATIVE
Ketones, ur: NEGATIVE mg/dL
Leukocytes, UA: NEGATIVE
Nitrite: NEGATIVE
PROTEIN: NEGATIVE mg/dL
Specific Gravity, Urine: 1.022 (ref 1.005–1.030)
Urobilinogen, UA: 0.2 mg/dL (ref 0.0–1.0)
pH: 6.5 (ref 5.0–8.0)

## 2014-03-10 LAB — COMPREHENSIVE METABOLIC PANEL
ALBUMIN: 4 g/dL (ref 3.5–5.2)
ALT: 19 U/L (ref 0–35)
AST: 15 U/L (ref 0–37)
Alkaline Phosphatase: 47 U/L (ref 39–117)
BUN: 11 mg/dL (ref 6–23)
CO2: 18 mEq/L — ABNORMAL LOW (ref 19–32)
CREATININE: 0.71 mg/dL (ref 0.50–1.10)
Calcium: 9 mg/dL (ref 8.4–10.5)
Chloride: 110 mEq/L (ref 96–112)
GFR calc Af Amer: >90 mL/min (ref >90)
GFR calc non Af Amer: >90 mL/min (ref >90)
Glucose, Bld: 91 mg/dL (ref 70–99)
Potassium: 3.3 mEq/L — ABNORMAL LOW (ref 3.7–5.3)
Sodium: 142 mEq/L (ref 137–147)
TOTAL PROTEIN: 6.8 g/dL (ref 6.0–8.3)
Total Bilirubin: <0.2 mg/dL — ABNORMAL LOW (ref 0.3–1.2)

## 2014-03-10 LAB — CK: CK TOTAL: 37 U/L (ref 7–177)

## 2014-03-10 MED ORDER — DESVENLAFAXINE SUCCINATE ER 100 MG PO TB24
100.0000 mg | ORAL_TABLET | Freq: Every day | ORAL | Status: DC
  Administered 2014-03-11 – 2014-03-12 (×2): 100 mg via ORAL
  Filled 2014-03-10 (×2): qty 1

## 2014-03-10 MED ORDER — PROMETHAZINE HCL 25 MG/ML IJ SOLN
12.5000 mg | Freq: Once | INTRAMUSCULAR | Status: AC
  Administered 2014-03-10: 12.5 mg via INTRAVENOUS
  Filled 2014-03-10: qty 1

## 2014-03-10 MED ORDER — HYDROMORPHONE HCL PF 1 MG/ML IJ SOLN
1.0000 mg | Freq: Once | INTRAMUSCULAR | Status: AC
  Administered 2014-03-10: 1 mg via INTRAVENOUS
  Filled 2014-03-10: qty 1

## 2014-03-10 MED ORDER — POLYETHYLENE GLYCOL 3350 17 G PO PACK
17.0000 g | PACK | Freq: Every day | ORAL | Status: DC | PRN
  Filled 2014-03-10: qty 1

## 2014-03-10 MED ORDER — PROMETHAZINE HCL 25 MG PO TABS
25.0000 mg | ORAL_TABLET | Freq: Three times a day (TID) | ORAL | Status: DC | PRN
  Administered 2014-03-11 – 2014-03-12 (×2): 25 mg via ORAL
  Filled 2014-03-10 (×2): qty 1

## 2014-03-10 MED ORDER — ENOXAPARIN SODIUM 40 MG/0.4ML ~~LOC~~ SOLN
40.0000 mg | SUBCUTANEOUS | Status: DC
  Administered 2014-03-11 – 2014-03-12 (×2): 40 mg via SUBCUTANEOUS
  Filled 2014-03-10 (×2): qty 0.4

## 2014-03-10 MED ORDER — FENTANYL CITRATE 0.05 MG/ML IJ SOLN
100.0000 ug | INTRAMUSCULAR | Status: DC | PRN
  Administered 2014-03-10 (×3): 100 ug via INTRAVENOUS
  Filled 2014-03-10 (×3): qty 2

## 2014-03-10 MED ORDER — HYDROCODONE-ACETAMINOPHEN 5-325 MG PO TABS
1.0000 | ORAL_TABLET | ORAL | Status: DC | PRN
  Administered 2014-03-11 (×2): 2 via ORAL
  Filled 2014-03-10 (×2): qty 2

## 2014-03-10 MED ORDER — IBUPROFEN 800 MG PO TABS
400.0000 mg | ORAL_TABLET | Freq: Four times a day (QID) | ORAL | Status: DC | PRN
Start: 2014-03-10 — End: 2014-03-12
  Administered 2014-03-11 (×4): 400 mg via ORAL
  Filled 2014-03-10 (×4): qty 1

## 2014-03-10 MED ORDER — SODIUM CHLORIDE 0.9 % IV BOLUS (SEPSIS)
1000.0000 mL | Freq: Once | INTRAVENOUS | Status: AC
  Administered 2014-03-10: 1000 mL via INTRAVENOUS

## 2014-03-10 MED ORDER — SODIUM CHLORIDE 0.9 % IV SOLN
INTRAVENOUS | Status: DC
  Administered 2014-03-10 – 2014-03-11 (×3): via INTRAVENOUS

## 2014-03-10 MED ORDER — ONDANSETRON HCL 4 MG/2ML IJ SOLN
4.0000 mg | INTRAMUSCULAR | Status: AC | PRN
  Administered 2014-03-10 (×2): 4 mg via INTRAVENOUS
  Filled 2014-03-10 (×2): qty 2

## 2014-03-10 NOTE — H&P (Signed)
PCP:   Haywood Pao, MD   Chief Complaint:  Diffuse arthralgias with paresthesias refractory to oral medications, question complication of treatment for Lyme disease  HPI: 38 year old female with past medical history significant for reflux esophagitis treated with proton pump inhibitors, major depressive disorder, migraine headaches who has had a six-month history of worsening headaches evaluated by Dr. Melton Alar, tried on multiple medications and now reasonably well controlled on Topamax. She also has a recent history of worsening abdominal discomfort in the rectal area, exams by different surgeons, and GI doctors including Dr. Oletta Lamas reveal unclear significance however she did have a small hematoma evacuated surgically within the last one month, with no evidence of recurrence. Given her headaches, she has been evaluated by multiple subspecialist at [facility] including neurology, as well as certain subspecialist in [place], but unclear whether she was physically seen, reviewed were ordered multiple labs which the patient's father has a record of and will provide in the morning. But there is a question of abnormal liver function test, abnormal ANA, positive Lyme titers, smooth muscle enzyme test positive and apparently patient was started on doxycycline within the last 1-1/2 weeks, which she started approximately last Monday, and since that time has been having worsening arthralgias, paresthesias at first in the upper extremities left greater than right and now in the lower extremities as well, increasing significantly this morning, prompting transport to the emergency room for further evaluation and management. Family believes that she may be suffering from herxheimer reaction typically seen after treatment for syphilis however question whether this can occur after initiation of treatment for Lyme disease. Symptomatically, patient has not seen a tick on her body recently or within the last  several months, question lesion over a year ago treated by dermatology, denies any fevers, chills, headaches stable if not better on Topamax, never had an LP.  Given testing that PCP has seen, including serologic test obtained by neurology revealed a 3 time elevation in LFTs, ANA of 1:1280, 1 band and IgG but tested positive for Lyme with confirmatory Western blot heme negative. Additional testing ordered by PCP on May 12 reveal, Dr. Osborne Casco, alpha-1 antitrypsin normal, ceruloplasmin normal, anti-DNA normal, hepatitis a IgM and total negative, hepatitis B reactive consistent with community, hep C normal type B negative, rheumatoid factor normal, LDH normal, RPR nonreactive, hepatitis B surface antigen screen negative, documented spotted fever IgG negative, IgM normal, actin smooth muscle antibody moderate to strong positive at 37 with the normal being 0-19, liver kidney microsomal antibody or 0.8 which is normal, ANA was normal except for a speckled pattern that was 1:640, serum CO2 14 however renal parameters and electrolytes otherwise normal, liver enzymes normal, CBC normal with white blood cell count 6.6, ferritin normal at 53, sedimentation rate 10 to count normal at 310. Please note that patient has an office visit with Dr. Johnnye Sima of infectious disease on June 2. In the emergency room, patient given IV fluids and IV fentanyl with relief of her pain and she is now being admitted for pain management and further evaluation of her presumed Lyme disease, as well as hospital complications thereof.  Review of Systems:  Negative for fevers, chills, positive for headaches, negative for visual changes, swallowing difficulties, rashes, tick exposure that is known, palpitations, chest pain, shortness of breath, cough, positive for nausea negative for vomiting, negative for change in bowel habits but does have chronic constipation, no recent bleeding in the stool or urine, positive for abdominal pain episodic and  glancing  lasting less than 1-2 seconds, and the right lower abdominal quadrant. Positive for diffuse arthralgias and myalgias as well as paresthesias left greater than right starting in the upper extremities and extending into the lower extremities, denies any difficulty speaking  Past Medical History: Past Medical History  Diagnosis Date  . Abnormal Pap smear   . Menorrhagia   . Lyme disease    Past Surgical History  Procedure Laterality Date  . Tonsillectomy and adenoidectomy    . Dilation and curettage of uterus    . Examination under anesthesia N/A 02/17/2014    Procedure: EXAM UNDER ANESTHESIA,  ULTRASOUND OF RECTUM, INCISION AND EXCISION OF THROMBOSED CLOT ;  Surgeon: Shann Medal, MD;  Location: [facility];  Service: General;  Laterality: N/A;   breast augmentation 2010 X line right knee surgery History of right hand surgery  Medications: Prior to Admission medications   Medication Sig Start Date End Date Taking? Authorizing Provider  desvenlafaxine (PRISTIQ) 100 MG 24 hr tablet Take 100 mg by mouth daily.   Yes Historical Provider, MD  doxycycline (DORYX) 100 MG EC tablet Take 100 mg by mouth 2 (two) times daily.   Yes Historical Provider, MD  HYDROcodone-acetaminophen (NORCO/VICODIN) 5-325 MG per tablet Take 1-2 tablets by mouth every 4 (four) hours as needed for moderate pain. 02/18/14  Yes Henreitta Cea, PA-C  ibuprofen (ADVIL,MOTRIN) 400 MG tablet Take 1 tablet (400 mg total) by mouth every 6 (six) hours as needed for fever, headache, mild pain, moderate pain or cramping. 02/19/14  Yes Earnstine Regal, PA-C  OVER THE COUNTER MEDICATION Take 1 tablet by mouth at bedtime as needed (sleep).   Yes Historical Provider, MD  polyethylene glycol (MIRALAX / GLYCOLAX) packet Take 17 g by mouth daily as needed (You can use this for acute constipation.). 02/19/14  Yes Earnstine Regal, PA-C  promethazine (PHENERGAN) 25 MG tablet Take 1 tablet (25 mg total) by mouth every 8 (eight) hours as  needed for nausea or vomiting. 10/11/13  Yes Collene Leyden, PA-C  topiramate (TOPAMAX) 25 MG tablet Take 25-150 mg by mouth 3 (three) times daily. 50mg  in the morning and 50 mg with lunch and 150mg  at bedtime   Yes Historical Provider, MD    Allergies:   Allergies  Allergen Reactions  . Atrovent [Ipratropium] Other (See Comments)    Uncontrollable nosebleeds     Social History:  reports that she has never smoked. She has never used smokeless tobacco. She reports that she drinks about .5 ounces of alcohol per week. She reports that she does not use illicit drugs. Works as a Probation officer, 3 children 2 girls and one boy 2 years of college married  Family History: Family History  Problem Relation Age of Onset  . Breast cancer Paternal Grandmother   . Cancer Paternal Grandmother 78    breast  . Hypothyroidism Mother    father alive and healthy at 18 Mother alive at 61 with hypothyroidism one sister older with GERD  Physical Exam: Filed Vitals:   03/10/14 1715 03/10/14 2001  BP: 127/75 109/68  Pulse: 92 76  Temp: 98.7 F (37.1 C) 98.5 F (36.9 C)  TempSrc: Oral Oral  Resp: 14   SpO2: 99% 100%   Well-developed and well-nourished, answering all questions appropriately, alert oriented x3, friend is also present for support  normocephalic atraumatic head Face is symmetric, tongue is midline, no oropharyngeal lesions Pupils are equal and reactive to light accommodation extraconal movements appear intact Neck is  supple, full range of motion, no cervical lymphadenopathy No axillary lymphadenopathy Lungs clear to auscultation bilaterally, no respiratory distress Cardiovascular reveals regular rate and rhythm Abdomen, soft, nontender, nondistended, bowel sounds are present, no rebound or guarding, patient does admit to episodic less than one second glance and blows of pain in the right lower quadrant, currently asymptomatic No lower extremity edema, pedal pulses strong and intact in  all 4 extremities distally Skin warm and dry with no apparent rash Cranial nerves II through XII grossly intact Muscle strength 5/5 in all 4 extremities with no apparent deficiencies, no resting tremor Coordination appears normal   Labs on Admission:   Recent Labs  03/10/14 1749  NA 142  K 3.3*  CL 110  CO2 18*  GLUCOSE 91  BUN 11  CREATININE 0.71  CALCIUM 9.0    Recent Labs  03/10/14 1749  AST 15  ALT 19  ALKPHOS 47  BILITOT <0.2*  PROT 6.8  ALBUMIN 4.0   No results found for this basename: LIPASE, AMYLASE,  in the last 72 hours  Recent Labs  03/10/14 1749  WBC 6.0  NEUTROABS 3.2  HGB 12.1  HCT 34.9*  MCV 84.5  PLT 263    Recent Labs  03/10/14 2033  CKTOTAL 37   No results found for this basename: TSH, T4TOTAL, FREET3, T3FREE, THYROIDAB,  in the last 72 hours No results found for this basename: VITAMINB12, FOLATE, FERRITIN, TIBC, IRON, RETICCTPCT,  in the last 72 hours  Radiological Exams on Admission: US Abdomen Complete  03/05/2014   CLINICAL DATA:  Elevated LFTs  EXAM: ULTRASOUND ABDOMEN COMPLETE  COMPARISON:  02/16/2014  FINDINGS: Gallbladder:  No gallstones or wall thickening visualized. No sonographic Murphy sign noted.  Common bile duct:  Diameter: 2.3 mm  Liver:  No focal lesion identified. Within normal limits in parenchymal echogenicity.  IVC:  No abnormality visualized.  Pancreas:  Visualized portion unremarkable.  Spleen:  Size and appearance within normal limits.  Right Kidney:  Length: 11.7 cm. Echogenicity within normal limits. No mass or hydronephrosis visualized.  Left Kidney:  Length: 11.6 cm. Echogenicity within normal limits. No mass or hydronephrosis visualized.  Abdominal aorta:  No aneurysm visualized.  Other findings:  None.  IMPRESSION: No acute finding by abdominal ultrasound   Electronically Signed   By: Daryll Brod M.D.   On: 03/05/2014 09:41   US Transvaginal Non-ob  02/16/2014   CLINICAL DATA:  Vaginal pain.  EXAM:  TRANSABDOMINAL AND TRANSVAGINAL ULTRASOUND OF PELVIS  TECHNIQUE: Both transabdominal and transvaginal ultrasound examinations of the pelvis were performed. Transabdominal technique was performed for global imaging of the pelvis including uterus, ovaries, adnexal regions, and pelvic cul-de-sac. It was necessary to proceed with endovaginal exam following the transabdominal exam to visualize the uterus, endometrium, and ovaries.  COMPARISON:  CT abdomen and pelvis 02/16/2014 and pelvic ultrasound 05/01/2007  FINDINGS: Uterus  Measurements: 9.6 x 4.3 x 5.6 cm. No fibroids or other mass visualized.  Endometrium  Thickness: 6.4 mm.  No focal abnormality visualized.  Right ovary  Measurements: 3.3 x 2.8 x 2.9 cm. Normal appearance/no adnexal mass.  Left ovary  Measurements: 3.1 x 1.5 x 2.0 cm. Normal appearance/no adnexal mass.  Other findings  No free fluid. Targeted ultrasound of the perineum in the area of patient's pain was without evidence of fluid collection.  IMPRESSION: Negative.   Electronically Signed   By: Logan Bores   On: 02/16/2014 17:57   US Pelvis Complete  02/16/2014  CLINICAL DATA:  Vaginal pain.  EXAM: TRANSABDOMINAL AND TRANSVAGINAL ULTRASOUND OF PELVIS  TECHNIQUE: Both transabdominal and transvaginal ultrasound examinations of the pelvis were performed. Transabdominal technique was performed for global imaging of the pelvis including uterus, ovaries, adnexal regions, and pelvic cul-de-sac. It was necessary to proceed with endovaginal exam following the transabdominal exam to visualize the uterus, endometrium, and ovaries.  COMPARISON:  CT abdomen and pelvis 02/16/2014 and pelvic ultrasound 05/01/2007  FINDINGS: Uterus  Measurements: 9.6 x 4.3 x 5.6 cm. No fibroids or other mass visualized.  Endometrium  Thickness: 6.4 mm.  No focal abnormality visualized.  Right ovary  Measurements: 3.3 x 2.8 x 2.9 cm. Normal appearance/no adnexal mass.  Left ovary  Measurements: 3.1 x 1.5 x 2.0 cm. Normal  appearance/no adnexal mass.  Other findings  No free fluid. Targeted ultrasound of the perineum in the area of patient's pain was without evidence of fluid collection.  IMPRESSION: Negative.   Electronically Signed   By: Logan Bores   On: 02/16/2014 17:57   Ct Abdomen Pelvis W Contrast  02/16/2014   CLINICAL DATA:  Worsening rectal pain.  EXAM: CT ABDOMEN AND PELVIS WITH CONTRAST  TECHNIQUE: Multidetector CT imaging of the abdomen and pelvis was performed using the standard protocol following bolus administration of intravenous contrast.  CONTRAST:  167mL OMNIPAQUE IOHEXOL 300 MG/ML SOLN, 80mL OMNIPAQUE IOHEXOL 300 MG/ML SOLN  COMPARISON:  None  FINDINGS: The visualized lung bases are clear. There is no pleural effusion. Bilateral breast implants are partially visualized.  The liver, gallbladder, spleen small adrenal glands, kidneys, and pancreas have an unremarkable enhanced appearance.  Contrast is present in nondilated loops of small bowel without evidence of obstruction. Colon is nondilated. The appendix is identified in the right lower quadrant and is unremarkable. Rectum is mildly distended with gas without evidence of wall thickening or perirectal fluid collection/abscess.  Bladder is unremarkable. Uterus and ovaries are grossly unremarkable. Trace free fluid is present in the pelvis. Small sclerotic foci in the L1 vertebral body and posterior right ilium likely represent bone islands. Small Schmorl's nodes are noted in the lower thoracic spine.  IMPRESSION: No etiology of rectal pain identified.  Trace pelvic free fluid.   Electronically Signed   By: Logan Bores   On: 02/16/2014 13:05   No orders found for this or any previous visit.  Assessment/Plan Principal Problem:   Lyme disease-diagnosis and question need to be reevaluated with all lab work serological studies, and western blot analysis, empirically placed on doxycycline within the last one week, question reaction to the same however  sedimentation rate, white blood cell count, is all normal. We'll continue current therapy and consider inpatient infectious disease workup per PCP, scheduled to see Dr. Johnnye Sima on June 2. No lymphadenopathy on exam, defer aggressive therapy with possible lumbar puncture to infectious disease or PCP Active Problems:   Arthralgia-unclear etiology, question relationship to antibiotic reaction, or other phenomena such as that related autoimmune disease given slight abnormality of labs including speckled pattern, and actin antibody or smooth muscle antibody test being positive. Question need for outpatient rheumatology evaluation. CK level is normal, aldolase pending.   Abdominal pain-short-lived lasting less than 1-2 seconds per patient report, currently asymptomatic, white blood cell count normal, recent ultrasound and CT scan unremarkable, will follow History of abnormal liver function test-now normal Pain management-we'll continue IV fentanyl along with IV fluids, and observe overnight, question transfer to oral medications within the next 24 hours.    Shanecia Hoganson R  Marshon Bangs 03/10/2014, 9:39 PM

## 2014-03-10 NOTE — ED Notes (Signed)
She c/o recent "stabbing abd. Pains; plus multiple myalgias and paresthesias.  She states she has been dx with Lyme's dis. And is on antibiotic therapy for this.

## 2014-03-10 NOTE — ED Notes (Signed)
Provided pt with 222mL of Ginger Ale 

## 2014-03-10 NOTE — ED Provider Notes (Signed)
CSN: [id number]     Arrival date & time 03/10/14  1701 History   First MD Initiated Contact with Patient 03/10/14 1718     Chief Complaint  Patient presents with  . Muscle Pain     (Consider location/radiation/quality/duration/timing/severity/associated sxs/prior Treatment) Patient is a 38 y.o. female presenting with musculoskeletal pain. The history is provided by the patient and a relative.  Muscle Pain   She is here for evaluation of generalized achiness, with numbness, concern for worsening symptoms, associated with treatment with doxycycline for possible Lyme disease. She was started on doxycycline 6 days ago. She does not have a firm diagnosis of Lyme disease, but has had some positive, and some negative tests. She feels like her pain is mostly in the muscles, and is aggravated by movement, and palpation. She also has soreness in the left elbow and wrist, and both knees and ankles. Her pain is aggravated by movement, and walking. She denies fever, chills, localized weakness, back, pain, or chest pain. She has not had rash. She is taking her usual medications, without relief. She is also taking Tylox without relief of her pain. There are no other known modifying factors.   Past Medical History  Diagnosis Date  . Abnormal Pap smear   . Menorrhagia   . Lyme disease    Past Surgical History  Procedure Laterality Date  . Tonsillectomy and adenoidectomy    . Dilation and curettage of uterus    . Examination under anesthesia N/A 02/17/2014    Procedure: EXAM UNDER ANESTHESIA,  ULTRASOUND OF RECTUM, INCISION AND EXCISION OF THROMBOSED CLOT ;  Surgeon: Shann Medal, MD;  Location: [facility];  Service: General;  Laterality: N/A;   Family History  Problem Relation Age of Onset  . Breast cancer Paternal Grandmother   . Cancer Paternal Grandmother 58    breast  . Hypothyroidism Mother    History  Substance Use Topics  . Smoking status: Never Smoker   . Smokeless tobacco: Never Used  .  Alcohol Use: 0.5 oz/week    1 drink(s) per week   OB History   Grav Para Term Preterm Abortions TAB SAB Ect Mult Living   2 2             Review of Systems  All other systems reviewed and are negative.     Allergies  Atrovent  Home Medications   Prior to Admission medications   Medication Sig Start Date End Date Taking? Authorizing Provider  cholecalciferol (VITAMIN D) 1000 UNITS tablet Take 1,000 Units by mouth daily.    Historical Provider, MD  desvenlafaxine (PRISTIQ) 100 MG 24 hr tablet Take 100 mg by mouth daily.    Historical Provider, MD  doxycycline (DORYX) 100 MG EC tablet Take 100 mg by mouth 2 (two) times daily.    Historical Provider, MD  HYDROcodone-acetaminophen (NORCO/VICODIN) 5-325 MG per tablet Take 1-2 tablets by mouth every 4 (four) hours as needed for moderate pain. 02/18/14   Henreitta Cea, PA-C  ibuprofen (ADVIL,MOTRIN) 400 MG tablet Take 1 tablet (400 mg total) by mouth every 6 (six) hours as needed for fever, headache, mild pain, moderate pain or cramping. 02/19/14   Earnstine Regal, PA-C  Melatonin 3 MG CAPS Take 3 mg by mouth at bedtime as needed (sleep).     Historical Provider, MD  polyethylene glycol (MIRALAX / GLYCOLAX) packet Take 17 g by mouth daily as needed (You can use this for acute constipation.). 02/19/14   Earnstine Regal,  PA-C  promethazine (PHENERGAN) 25 MG tablet Take 1 tablet (25 mg total) by mouth every 8 (eight) hours as needed for nausea or vomiting. 10/11/13   Collene Leyden, PA-C  topiramate (TOPAMAX) 25 MG tablet Take 25-125 mg by mouth 3 (three) times daily. 50mg  in the morning and 50 mg with lunch and 125mg  at bedtime    Historical Provider, MD   BP 127/75  Pulse 92  Temp(Src) 98.7 F (37.1 C) (Oral)  Resp 14  SpO2 99%  LMP 01/28/2014 Physical Exam  Nursing note and vitals reviewed. Constitutional: She is oriented to person, place, and time. She appears well-developed and well-nourished. She appears distressed (she is  uncomfortable).  HENT:  Head: Normocephalic and atraumatic.  Eyes: Conjunctivae and EOM are normal. Pupils are equal, round, and reactive to light.  Neck: Normal range of motion and phonation normal. Neck supple.  Cardiovascular: Normal rate, regular rhythm and intact distal pulses.   Pulmonary/Chest: Effort normal and breath sounds normal. She exhibits no tenderness.  Abdominal: Soft. She exhibits no distension and no mass. There is no tenderness. There is no rebound and no guarding.  Musculoskeletal: Normal range of motion.  No localized joint swelling of the arms or legs. Mild, diffuse tenderness of the left upper and lower arm, and left elbow. Mild tenderness of the knees and ankles bilaterally without deformity or swelling. Normal strength in the arms and legs, bilaterally  Neurological: She is alert and oriented to person, place, and time. No cranial nerve deficit. She exhibits normal muscle tone. Coordination normal.  Skin: Skin is warm and dry.  Psychiatric: She has a normal mood and affect. Her behavior is normal. Judgment and thought content normal.    ED Course  Procedures (including critical care time)  Medications  0.9 %  sodium chloride infusion ( Intravenous New Bag/Given 03/10/14 1904)  fentaNYL (SUBLIMAZE) injection 100 mcg (100 mcg Intravenous Given 03/10/14 1754)  sodium chloride 0.9 % bolus 1,000 mL (0 mLs Intravenous Stopped 03/10/14 1907)  ondansetron (ZOFRAN) injection 4 mg (4 mg Intravenous Given 03/10/14 1901)  HYDROmorphone (DILAUDID) injection 1 mg (1 mg Intravenous Given 03/10/14 1901)    Patient Vitals for the past 24 hrs:  BP Temp Temp src Pulse Resp SpO2  03/10/14 1715 127/75 mmHg 98.7 F (37.1 C) Oral 92 14 99 %    5:42 PM Reevaluation with update and discussion. After initial assessment and treatment, an updated evaluation reveals she has persistent, diffuse, pain, and intermittent, sharp, right, abdominal pain. She had transient relief from analgesia here.  Richarda Blade   8:15 PM-Consult complete with Dr. Dagmar Hait. Patient case explained and discussed. He agrees to admit patient for further evaluation and treatment. Call ended at 20:20   Labs Review Labs Reviewed  CBC WITH DIFFERENTIAL - Abnormal; Notable for the following:    HCT 34.9 (*)    All other components within normal limits  URINALYSIS, ROUTINE W REFLEX MICROSCOPIC - Abnormal; Notable for the following:    APPearance CLOUDY (*)    All other components within normal limits  COMPREHENSIVE METABOLIC PANEL - Abnormal; Notable for the following:    Potassium 3.3 (*)    CO2 18 (*)    Total Bilirubin <0.2 (*)    All other components within normal limits  SEDIMENTATION RATE    Imaging Review No results found.   EKG Interpretation None      MDM   Final diagnoses:  Abdominal pain  Myalgia    Nonspecific myalgia,  and abdominal pain. She is currently being treated for Lyme disease, with doxycycline. Possible reaction to medication, and/or treatment effect from lysis of pathogens. She is hemodynamically stable.  Nursing Notes Reviewed/ Care Coordinated Applicable Imaging Reviewed Interpretation of Laboratory Data incorporated into ED treatment  Plan: Admit   Richarda Blade, MD 03/10/14 2022

## 2014-03-10 NOTE — Evaluation (Signed)
Tests: (1) Alpha-1-Antitrypsin, Serum (098119) ! Alpha-1-Antitrypsin, Serum                             148 mg/dL                   90-200  Tests: (2) Antinuclear Antibodies, IFA (147829) ! Antinuclear Antibodies, IFA                             See patterns                                                         Negative   <1:80                                                         Borderline  1:80                                                         Positive   >1:80  Tests: (3) Ceruloplasmin (562130) ! Ceruloplasmin             23.5 mg/dL                  16.0-45.0  Tests: (4) Anti-dsDNA Antibodies (865784) ! Anti-DNA (DS) Ab Qn       <1 IU/mL                    0-9                                                       Negative      <5                                                       Equivocal  5 - 9                                                       Positive      >9  Tests: (5) Hep A Ab, IgM (696295) ! Hep A Ab, IgM             Negative                    Negative  Tests: (6) Hep A Ab, Total (284132) ! Hep A Ab, Total  Negative                    Negative  Tests: (7) Hep B Surface Ab (681275) ! Hep B Surface Ab, Qual                             Reactive                                  Non Reactive: Inconsistent with immunity,                                                less than 10 mIU/mL                                  Reactive:     Consistent with immunity,                                                greater than 9.9 mIU/mL  Tests: (8) HCV Antibody (170017) ! Hep C Virus Ab            <0.1 s/co ratio             0.0-0.9                                                      Negative:     < 0.8                                                 Indeterminate: 0.8 - 0.9                                                      Positive:     > 0.9                                                                          .                      In order to  reduce the incidence of a false positive                      result, the CDC recommends that all s/co ratios  between 1.0 and 10.9 be confirmed by a more specific                      supplemental or PCR testing. [facility] offers HCV Ab                      w/Reflex to Verification test [phone].  Tests: (9) Panel [id number] (974163) ! HIV 1/O/2 Abs-Index Value                             <1.00                       <1.00     Index Value: Specimen reactivity relative to the negative cutoff. ! HIV 1/O/2 Abs, Qual       Non Reactive                Non Reactive  Tests: (10) Rheumatoid Arthritis Factor (845364) ! RA Latex Turbid.          8.7 IU/mL                   0.0-13.9  Tests: (11) LDH (680321)   LDH                       126 IU/L                    119-226  Tests: (12) RPR (224825) ! RPR                       Non Reactive                Non Reactive  Tests: (13) HBsAg Screen (003704) ! HBsAg Screen              Negative                    Negative  Tests: (14) Rky(IgG/M) (888916) ! RMSF, IgG, EIA            Negative                    Negative ! [place] Spotted Fever, IgM                             0.29 index                  0.00-0.89                                                     Negative        <0.90                                                     Equivocal 0.90 - 1.10  Positive        >1.10  Tests: (15) Actin (Smooth Muscle) Antibody (825189) ! Actin (Smooth Muscle) Antibody                        [H]  37 Units                    0-19                                     Negative                     0 - 19                                     Weak positive               20 - 30                                     Moderate to strong positive     >30                                                                         .                     Actin Antibodies are found in 52-85% of patients with                      autoimmune hepatitis or chronic active hepatitis and                     in 22% of patients with primary biliary cirrhosis.  Tests: (16) Liver-Kidney Microsomal Ab [phone]) ! Liver-Kidney Microsomal Ab                             4.8 Units                   0.0-20.0                                                    Negative    0.0 - 20.0                                                    Equivocal  20.1 - 24.9  Positive         >24.9                                                                         .                    LKM type 1 antibodies are detected in patients with                    autoimmune hepatitis type 2 and in up to 8% of                    patients with chronic HCV infection.  Tests: (17) FANA Staining Patterns (086578) ! Homogeneous Pattern       NP (X) ! Nucleolar Pattern         NP (X) ! Speckled Pattern     [H]  1:640 ! Centromere Pattern        NP (X) !   Note:                   Comment     A positive ANA result may occur in healthy individuals or     be associated with a variety of diseases.  See interpre-     tation below:                                                                .     Pattern      Antigen Detected  Suggested Disease Association     -----------  ----------------  -----------------------------     Homogeneous  DNA(ds,ss,),      High titers - SLE     (Smooth)     Histone     -----------  ----------------  -----------------------------     Speckled     Sm, RNP, SCL-70,  SLE,MCTD,Scleroderma,Sjogrens                  SS-A/SS-B     -----------  ----------------  -----------------------------     Nucleolar    SCL-70, PM-1/SCL  High titers Scleroderma Poly-                                    myositis/Scleroderma Overlap     -----------  ----------------  -----------------------------     Centromere   Centromere        PSS w/Crest syndrome variable     -----------   ---------------- ------------------------------  Tests: (18) COMPLETE METABOLIC (4696)   GLUCOSE              [H]  112 mg/dl                   60-110   BUN                       8 mg/dl  5-23   CREATININE                0.7 mg/dl                   0.3-1.5  eGFR Non-African American                             94.7  eGFR African American                             114.6   SODIUM                    138 mEq/L                   135-148   POTASSIUM                 3.7 mEq/L                   3.5-5.3   CHLORIDE             [H]  114 mEq/L                   80-111   CO2                  [L]  14 mEq/L                    15-35   CALCIUM                   9.4 mg/dL                   7.0-10.5   TOTAL PROTEIN             7.7 g/dL                    6.0-8.5   ALBUMIN                   4.5 g/dL                    2.0-5.5   AST                       21 IU/L                     7-45   ALT                       36 IU/L                     5-40   ALK PHOS                  52 IU/L                     37-137   TOTAL BILIRUBIN           0.6 mg/dl                   0.1-1.5  Tests: (19) CBC (2000)   WBC                       6.[place]  K/uL                   4.10-10.90   LYM                       2.0 K/uL                    0.6-4.1 ! MID                       0.4 K/uL                    0.0-1.8   GRAN                      4.2 K/uL                    2.0-7.8   LYM%                      30.1 %                      10.0-58.5 ! MID%                      6.1 %                       0.1-24.0   GRAN%                     63.8                        37.0-92.0   RBC                       4.7 M/uL                    4.2-6.3   HGB                       14.2 g/dL                   12.0-18.0   HCT                       40.7 %                      37.0-51.0   MCV                       87.6 fL                     80.0-97.0   MCH                       30.5 pg                     26.0-32.0   MCHC                       34.9 g/dL                   31.0-36.0   PLT  310 K/uL                    140-440  Tests: (20) FERRITIN (2383)   FERRITIN                  53.2 ng/mL                  28.0-397.0  Tests: (21) SED RATE (5470)   SED RATE                  10 mm/hr                    0-20  Note: An exclamation mark (!) indicates a result that was not dispersed into the flowsheet. Document Creation Date: 03/08/2014 5:51 AM

## 2014-03-11 ENCOUNTER — Encounter (HOSPITAL_COMMUNITY): Payer: Self-pay

## 2014-03-11 LAB — COMPREHENSIVE METABOLIC PANEL
ALBUMIN: 3.2 g/dL — AB (ref 3.5–5.2)
ALK PHOS: 37 U/L — AB (ref 39–117)
ALT: 14 U/L (ref 0–35)
AST: 12 U/L (ref 0–37)
BUN: 7 mg/dL (ref 6–23)
CHLORIDE: 114 meq/L — AB (ref 96–112)
CO2: 19 mEq/L (ref 19–32)
Calcium: 8 mg/dL — ABNORMAL LOW (ref 8.4–10.5)
Creatinine, Ser: 0.67 mg/dL (ref 0.50–1.10)
GFR calc Af Amer: >90 mL/min (ref >90)
GFR calc non Af Amer: >90 mL/min (ref >90)
Glucose, Bld: 88 mg/dL (ref 70–99)
POTASSIUM: 3.3 meq/L — AB (ref 3.7–5.3)
SODIUM: 143 meq/L (ref 137–147)
TOTAL PROTEIN: 5.3 g/dL — AB (ref 6.0–8.3)

## 2014-03-11 LAB — CBC
HEMATOCRIT: 31.4 % — AB (ref 36.0–46.0)
Hemoglobin: 10.4 g/dL — ABNORMAL LOW (ref 12.0–15.0)
MCH: 28.8 pg (ref 26.0–34.0)
MCHC: 33.1 g/dL (ref 30.0–36.0)
MCV: 87 fL (ref 78.0–100.0)
PLATELETS: 209 10*3/uL (ref 150–400)
RBC: 3.61 MIL/uL — ABNORMAL LOW (ref 3.87–5.11)
RDW: 12.6 % (ref 11.5–15.5)
WBC: 4 10*3/uL (ref 4.0–10.5)

## 2014-03-11 MED ORDER — TOPIRAMATE 25 MG PO TABS
50.0000 mg | ORAL_TABLET | ORAL | Status: DC
  Administered 2014-03-11: 50 mg via ORAL
  Filled 2014-03-11 (×4): qty 2

## 2014-03-11 MED ORDER — POTASSIUM CHLORIDE CRYS ER 20 MEQ PO TBCR
40.0000 meq | EXTENDED_RELEASE_TABLET | Freq: Once | ORAL | Status: AC
  Administered 2014-03-11: 40 meq via ORAL
  Filled 2014-03-11: qty 2

## 2014-03-11 MED ORDER — ZOLPIDEM TARTRATE 5 MG PO TABS
5.0000 mg | ORAL_TABLET | Freq: Once | ORAL | Status: AC
  Administered 2014-03-11: 5 mg via ORAL
  Filled 2014-03-11: qty 1

## 2014-03-11 MED ORDER — TOPIRAMATE 100 MG PO TABS
150.0000 mg | ORAL_TABLET | Freq: Every day | ORAL | Status: DC
  Filled 2014-03-11 (×2): qty 1.5

## 2014-03-11 MED ORDER — TOPIRAMATE 25 MG PO TABS
25.0000 mg | ORAL_TABLET | Freq: Two times a day (BID) | ORAL | Status: DC
  Administered 2014-03-11 – 2014-03-12 (×2): 25 mg via ORAL
  Filled 2014-03-11 (×4): qty 1

## 2014-03-11 NOTE — Progress Notes (Signed)
Called Dr. Loren Racer office regarding pain meds for patient and talked to [facility], who said she would pass on the message to him.

## 2014-03-11 NOTE — Progress Notes (Signed)
Subjective: Feels tired, but her arms are not painful today as they used to be.  She still feels weak all over.  Has not eaten this morning.  Objective: Vital signs in last 24 hours: Temp:  [97.9 F (36.6 C)-98.7 F (37.1 C)] 98 F (36.7 C) (05/18 0606) Pulse Rate:  [63-92] 63 (05/18 0606) Resp:  [14-16] 16 (05/18 0606) BP: (91-127)/(60-79) 91/60 mmHg (05/18 0606) SpO2:  [98 %-100 %] 98 % (05/18 0606) Weight change:     Intake/Output from previous day:   Intake/Output this shift:    Pupils are equal and reactive to light accommodation  Neck is supple, full range of motion, no cervical lymphadenopathy  No axillary lymphadenopathy  Lungs clear to auscultation bilaterally, no respiratory distress  Heart: RRR, no murmur Abdomen, soft, nontender, complains of periodic RLQ pain, on menses currentlyNo lower extremity edema, pedal pulses strong and intact in all 4 extremities distally  Skin warm and dry with no apparent rash  Cranial nerves II through XII grossly intact  Muscle strength 5/5 in all 4 extremities Coordination appears normal  Lab Results:  Recent Labs  03/10/14 1749 03/11/14 0446  WBC 6.0 4.0  HGB 12.1 10.4*  HCT 34.9* 31.4*  PLT 263 209   BMET  Recent Labs  03/10/14 1749 03/11/14 0446  NA 142 143  K 3.3* 3.3*  CL 110 114*  CO2 18* 19  GLUCOSE 91 88  BUN 11 7  CREATININE 0.71 0.67  CALCIUM 9.0 8.0*    Studies/Results: No results found.  Medications:  I have reviewed the patient's current medications. Scheduled: . desvenlafaxine  100 mg Oral Daily  . enoxaparin (LOVENOX) injection  40 mg Subcutaneous Q24H  . potassium chloride  40 mEq Oral Once  . topiramate  25 mg Oral BID   Continuous:  ZOX:[id number], ibuprofen, polyethylene glycol, promethazine  Assessment/Plan: Weakness:  Reviewed her testing from other MD's thus far.  She does NOT have Lyme disease.  She had an IgG band positive with the confirmatory Western Blot  negative, which rules out Lyme.  Unlikely based on her location in [place] (but not impossible).  I have stopped her Doxycycline today as there is no purpose to treat it.  She has Neuro and local Neuro from [facility] following her as well.  Her weakness sounds more like it is related to too high a dose of Topamax.  Will decrease to 25mg  bid (50mg  total per day) and see if that helps.  PT to get her out of bed.  Oral pain meds only if severe, otherwise heating pad if abd pain returns.  Negative u/s done yesterday in the ER. She had a positive ANA speckled, see earlier note of labs with confirmatory testing pending, but with a normals CK and sed rate, would not consider steroids. Will try to get in touch with her Neuro at [facility] as she was to have MRI's tomorrow.  Perhaps we can do them here, but likely ok if they need to be rescheduled.  LOS: 1 day   Fransico Him [facility] 03/11/2014, 8:31 AM

## 2014-03-11 NOTE — Progress Notes (Signed)
Called Dr. Loren Racer office back because I didn't get a call back about her pain meds, Larene [place] told me that he is not going to give her anything else that the ibuprofen would have to do and that he was on the phone with other doctors discussing her case and pain management.

## 2014-03-11 NOTE — Evaluation (Signed)
Physical Therapy Evaluation Patient Details Name: Lindsay Giles MRN: [id number] DOB: 1976-07-01 Today's Date: 03/11/2014   History of Present Illness  38 yo female adm with  diffuse arthralgias with paresthesias,  question if complication of treatment (meds-Topamax and Doxycycline) for Lyme disease; Pt tests negative for Lyme disease   PMHx:  GERD, MDD, mirgraines  Clinical Impression  Pt with limited mobility due to diffuse pain; RN aware and contacted MD; Pt tremulous all extremities with activity, requiring +2 for safety; Will need HHPT and I have discussed 24hr assist, help with her kids, etc    Follow Up Recommendations Home health PT;Supervision for mobility/OOB    Equipment Recommendations       Recommendations for Other Services       Precautions / Restrictions Precautions Precautions: Fall      Mobility  Bed Mobility Overal bed mobility: Needs Assistance Bed Mobility: Supine to Sit;Sit to Supine     Supine to sit: Min assist Sit to supine: Min assist   General bed mobility comments: cues for self assist  Transfers Overall transfer level: Needs assistance Equipment used: Rolling walker (2 wheeled) Transfers: Sit to/from Stand Sit to Stand: Mod assist         General transfer comment: assist for wt shift to stand, very shaky  Ambulation/Gait Ambulation/Gait assistance: +2 physical assistance;Mod assist Ambulation Distance (Feet): 12 Feet (x 2) Assistive device: Rolling walker (2 wheeled) Gait Pattern/deviations: Step-to pattern;Narrow base of support;Shuffle Gait velocity: decr   General Gait Details: cues for safety, st shift, requiring +2 for safety and due to R knee buckling  Stairs            Wheelchair Mobility    Modified Rankin (Stroke Patients Only)       Balance Overall balance assessment: Needs assistance Sitting-balance support: No upper extremity supported;Feet supported Sitting balance-Leahy Scale: Fair       Standing  balance-Leahy Scale: Zero                               Pertinent Vitals/Pain Diffuse pain    Home Living Family/patient expects to be discharged to:: Private residence Living Arrangements: Spouse/significant other   Type of Home: House Home Access: Stairs to enter   Technical brewer of Steps: 3 Home Layout: Multi-level Home Equipment: Environmental consultant - 2 wheels      Prior Function Level of Independence: Independent               Hand Dominance        Extremity/Trunk Assessment   Upper Extremity Assessment: Generalized weakness           Lower Extremity Assessment: Generalized weakness      Cervical / Trunk Assessment: Normal  Communication   Communication: No difficulties  Cognition Arousal/Alertness: Awake/alert Behavior During Therapy: WFL for tasks assessed/performed Overall Cognitive Status: Within Functional Limits for tasks assessed                      General Comments      Exercises        Assessment/Plan    PT Assessment Patient needs continued PT services  PT Diagnosis Difficulty walking   PT Problem List Decreased strength;Decreased activity tolerance;Decreased balance;Decreased mobility;Decreased safety awareness;Decreased knowledge of use of DME;Decreased knowledge of precautions;Decreased range of motion  PT Treatment Interventions DME instruction;Gait training;Functional mobility training;Therapeutic activities;Therapeutic exercise;Stair training;Patient/family education   PT Goals (Current goals can  be found in the Care Plan section) Acute Rehab PT Goals Patient Stated Goal: to decr pain and do more PT Goal Formulation: With patient Time For Goal Achievement: 03/18/14 Potential to Achieve Goals: Good    Frequency Min 4X/week   Barriers to discharge        Co-evaluation               End of Session Equipment Utilized During Treatment: Gait belt Activity Tolerance: Patient limited by  fatigue;Patient limited by pain Patient left: in bed;with call bell/phone within reach;with family/visitor present Nurse Communication: Mobility status         Time: 1140-1216 PT Time Calculation (min): 36 min   Charges:   PT Evaluation $Initial PT Evaluation Tier I: 1 Procedure PT Treatments $Gait Training: 8-22 mins $Therapeutic Activity: 8-22 mins   PT G Codes:          Neil Crouch 03/11/2014, 12:48 PM

## 2014-03-11 NOTE — Progress Notes (Signed)
UR completed 

## 2014-03-12 LAB — BASIC METABOLIC PANEL
BUN: 9 mg/dL (ref 6–23)
CALCIUM: 8.9 mg/dL (ref 8.4–10.5)
CHLORIDE: 108 meq/L (ref 96–112)
CO2: 20 mEq/L (ref 19–32)
Creatinine, Ser: 0.64 mg/dL (ref 0.50–1.10)
GFR calc non Af Amer: >90 mL/min (ref >90)
Glucose, Bld: 86 mg/dL (ref 70–99)
Potassium: 3.4 mEq/L — ABNORMAL LOW (ref 3.7–5.3)
Sodium: 140 mEq/L (ref 137–147)

## 2014-03-12 MED ORDER — IBUPROFEN 400 MG PO TABS
400.0000 mg | ORAL_TABLET | Freq: Four times a day (QID) | ORAL | Status: DC | PRN
  Administered 2014-03-12 (×2): 400 mg via ORAL
  Filled 2014-03-12 (×2): qty 1

## 2014-03-12 MED ORDER — TOPIRAMATE 25 MG PO TABS: 25.0000 mg | ORAL_TABLET | Freq: Two times a day (BID) | ORAL | Status: DC

## 2014-03-12 MED ORDER — POTASSIUM CHLORIDE CRYS ER 20 MEQ PO TBCR
40.0000 meq | EXTENDED_RELEASE_TABLET | Freq: Once | ORAL | Status: AC
  Administered 2014-03-12: 40 meq via ORAL
  Filled 2014-03-12: qty 2

## 2014-03-12 NOTE — Discharge Summary (Signed)
DISCHARGE SUMMARY  OTHEL DICOSTANZO  MR#: [id number]  DOB:11-09-1975  Date of Admission: 03/10/2014 Date of Discharge: 03/12/2014  Attending Physician:Jeff Frieden W Fabrice Dyal  Patient's DVV:OHYWVPX,TGGYIRS W, MD   Discharge Diagnoses: Active Problems:   Arthralgia   Abdominal pain  Generalized weakness Major Depressive disorder, NOS, on Pristiq  Discharge Medications:   Medication List    STOP taking these medications       doxycycline 100 MG EC tablet  Commonly known as:  DORYX     HYDROcodone-acetaminophen 5-325 MG per tablet  Commonly known as:  NORCO/VICODIN      TAKE these medications       desvenlafaxine 100 MG 24 hr tablet  Commonly known as:  PRISTIQ  Take 100 mg by mouth daily.     ibuprofen 400 MG tablet  Commonly known as:  ADVIL,MOTRIN  Take 1 tablet (400 mg total) by mouth every 6 (six) hours as needed for fever, headache, mild pain, moderate pain or cramping.     OVER THE COUNTER MEDICATION  Take 1 tablet by mouth at bedtime as needed (sleep).     polyethylene glycol packet  Commonly known as:  MIRALAX / GLYCOLAX  Take 17 g by mouth daily as needed (You can use this for acute constipation.).     promethazine 25 MG tablet  Commonly known as:  PHENERGAN  Take 1 tablet (25 mg total) by mouth every 8 (eight) hours as needed for nausea or vomiting.     topiramate 25 MG tablet  Commonly known as:  TOPAMAX  Take 1 tablet (25 mg total) by mouth 2 (two) times daily.        Hospital Procedures: US Abdomen Complete  03/05/2014   CLINICAL DATA:  Elevated LFTs  EXAM: ULTRASOUND ABDOMEN COMPLETE  COMPARISON:  02/16/2014  FINDINGS: Gallbladder:  No gallstones or wall thickening visualized. No sonographic Murphy sign noted.  Common bile duct:  Diameter: 2.3 mm  Liver:  No focal lesion identified. Within normal limits in parenchymal echogenicity.  IVC:  No abnormality visualized.  Pancreas:  Visualized portion unremarkable.  Spleen:  Size and appearance within  normal limits.  Right Kidney:  Length: 11.7 cm. Echogenicity within normal limits. No mass or hydronephrosis visualized.  Left Kidney:  Length: 11.6 cm. Echogenicity within normal limits. No mass or hydronephrosis visualized.  Abdominal aorta:  No aneurysm visualized.  Other findings:  None.  IMPRESSION: No acute finding by abdominal ultrasound   Electronically Signed   By: Daryll Brod M.D.   On: 03/05/2014 09:41   US Transvaginal Non-ob  02/16/2014   CLINICAL DATA:  Vaginal pain.  EXAM: TRANSABDOMINAL AND TRANSVAGINAL ULTRASOUND OF PELVIS  TECHNIQUE: Both transabdominal and transvaginal ultrasound examinations of the pelvis were performed. Transabdominal technique was performed for global imaging of the pelvis including uterus, ovaries, adnexal regions, and pelvic cul-de-sac. It was necessary to proceed with endovaginal exam following the transabdominal exam to visualize the uterus, endometrium, and ovaries.  COMPARISON:  CT abdomen and pelvis 02/16/2014 and pelvic ultrasound 05/01/2007  FINDINGS: Uterus  Measurements: 9.6 x 4.3 x 5.6 cm. No fibroids or other mass visualized.  Endometrium  Thickness: 6.4 mm.  No focal abnormality visualized.  Right ovary  Measurements: 3.3 x 2.8 x 2.9 cm. Normal appearance/no adnexal mass.  Left ovary  Measurements: 3.1 x 1.5 x 2.0 cm. Normal appearance/no adnexal mass.  Other findings  No free fluid. Targeted ultrasound of the perineum in the area of patient's pain was without evidence of fluid  collection.  IMPRESSION: Negative.   Electronically Signed   By: Logan Bores   On: 02/16/2014 17:57   US Pelvis Complete  02/16/2014   CLINICAL DATA:  Vaginal pain.  EXAM: TRANSABDOMINAL AND TRANSVAGINAL ULTRASOUND OF PELVIS  TECHNIQUE: Both transabdominal and transvaginal ultrasound examinations of the pelvis were performed. Transabdominal technique was performed for global imaging of the pelvis including uterus, ovaries, adnexal regions, and pelvic cul-de-sac. It was necessary to  proceed with endovaginal exam following the transabdominal exam to visualize the uterus, endometrium, and ovaries.  COMPARISON:  CT abdomen and pelvis 02/16/2014 and pelvic ultrasound 05/01/2007  FINDINGS: Uterus  Measurements: 9.6 x 4.3 x 5.6 cm. No fibroids or other mass visualized.  Endometrium  Thickness: 6.4 mm.  No focal abnormality visualized.  Right ovary  Measurements: 3.3 x 2.8 x 2.9 cm. Normal appearance/no adnexal mass.  Left ovary  Measurements: 3.1 x 1.5 x 2.0 cm. Normal appearance/no adnexal mass.  Other findings  No free fluid. Targeted ultrasound of the perineum in the area of patient's pain was without evidence of fluid collection.  IMPRESSION: Negative.   Electronically Signed   By: Logan Bores   On: 02/16/2014 17:57   Ct Abdomen Pelvis W Contrast  02/16/2014   CLINICAL DATA:  Worsening rectal pain.  EXAM: CT ABDOMEN AND PELVIS WITH CONTRAST  TECHNIQUE: Multidetector CT imaging of the abdomen and pelvis was performed using the standard protocol following bolus administration of intravenous contrast.  CONTRAST:  140mL OMNIPAQUE IOHEXOL 300 MG/ML SOLN, 64mL OMNIPAQUE IOHEXOL 300 MG/ML SOLN  COMPARISON:  None  FINDINGS: The visualized lung bases are clear. There is no pleural effusion. Bilateral breast implants are partially visualized.  The liver, gallbladder, spleen small adrenal glands, kidneys, and pancreas have an unremarkable enhanced appearance.  Contrast is present in nondilated loops of small bowel without evidence of obstruction. Colon is nondilated. The appendix is identified in the right lower quadrant and is unremarkable. Rectum is mildly distended with gas without evidence of wall thickening or perirectal fluid collection/abscess.  Bladder is unremarkable. Uterus and ovaries are grossly unremarkable. Trace free fluid is present in the pelvis. Small sclerotic foci in the L1 vertebral body and posterior right ilium likely represent bone islands. Small Schmorl's nodes are noted in the  lower thoracic spine.  IMPRESSION: No etiology of rectal pain identified.  Trace pelvic free fluid.   Electronically Signed   By: Logan Bores   On: 02/16/2014 13:05    History of Present Illness: Patient is a 38 year old female initially treated for migraines who has developed pain and weakness diffusely.  Hospital Course: Nannie was admitted through the [facility] per my partner, Dr. Dagmar Hait, on Sunday night after coming to the ER complaining of diffuse pain in her muscles and generalized weakness.  Of note, she has seen many other physicians including Neurology at [facility] and an Integrative Medicine specialist, as well as her father, a retired Magazine features editor, [place] several physicians that he knows.  She was supposedly diagnosed with Lyme disease and requested doxycycline through my office the prior week since she could not get in contact with the MD who performed the test.  In reviewing the test, she had 1 positive band and a negative confirmatory Western Blot on the IgG bands which was negative, thus being a negative test.  Her doxycycline was discontinued at this hospitalization.  In addition it was noted that her Topamax dose was much higher than was reported per her last note  per Dr. Catalina Gravel, her local neurologist and headache specialist.  Her symptoms of weakness, paresthesias and forgetfulness were thought to be at least partly related to the elevation in Topamax dose.  There is question as to whom instructed her to elevate the dose, whether it was physicians directly involved in her care or suggested by a physician elsewhere who had not actually seen her.  The reduction was made to 25mg  bid and she seemed more aware today but still feels weak all over.  I made an attempt to contact her Neurologist at [facility].  She was not available yesterday and instead I spoke with a Dr. Linus Mako, who is one of her partners.  He would not provide me with any direct information about what type of MRI was  ordered so that we could perform it here at this admission and he did not provide any notes or further information of use, instead referring me to medical records.  I faxed a records release, but as of this discharge summary and 2 phone calls, have yet to receive any information.  She had improved somewhat with the Topamax reduction, but will take days to see if that is the culprit.   Anderson Malta and I talked at length this AM and came up with the plan of her being discharged today, calling Neuro at [facility] to see if they would see her, attending her MRI there as scheduled this afternoon, and if she continued to feel weak, to seek care at their ER, so that she could be seen by a Neurology consultant today.  She is instructed to bring with her this discharge summary as well as the autoimmune testing labs which she was handed in paper form for review at [facility] as well.  Day of Discharge Exam BP 101/71  Pulse 76  Temp(Src) 98.2 F (36.8 C) (Oral)  Resp 18  Ht 5\' 6"  (1.676 m)  SpO2 99%  LMP 01/28/2014  Physical Exam: Pupils are equal and reactive to light accommodation  Neck is supple, full range of motion, no cervical lymphadenopathy  No axillary lymphadenopathy  Lungs clear to auscultation bilaterally, no respiratory distress  Heart: RRR, no murmur  Abdomen, soft, nontender, complains of periodic RLQ pain, on menses currentlyNo lower extremity edema, pedal pulses strong and intact in all 4 extremities distally  Skin warm and dry with no apparent rash  Cranial nerves II through XII grossly intact  Muscle strength 5/5 in all 4 extremities  Coordination appears normal   Discharge Labs:  Recent Labs  03/11/14 0446 03/12/14 0412  NA 143 140  K 3.3* 3.4*  CL 114* 108  CO2 19 20  GLUCOSE 88 86  BUN 7 9  CREATININE 0.67 0.64  CALCIUM 8.0* 8.9    Recent Labs  03/10/14 1749 03/11/14 0446  AST 15 12  ALT 19 14  ALKPHOS 47 37*  BILITOT <0.2* <0.2*  PROT 6.8 5.3*  ALBUMIN 4.0 3.2*     Recent Labs  03/10/14 1749 03/11/14 0446  WBC 6.0 4.0  NEUTROABS 3.2  --   HGB 12.1 10.4*  HCT 34.9* 31.4*  MCV 84.5 87.0  PLT 263 209    Recent Labs  03/10/14 2033  CKTOTAL 37   Autoimmune confirmatory testing for Lupus, Sjogren's and Scleroderma all resulted as negative through our office ([facility] processed) late yesterday.  Discharge instructions: Reduce Topamax as above, avoid narcotic pain meds, Call Neurologist at [facility] for advice on next step of treatment  Disposition: See below  Follow-up  Appts: She is to contact her Neurologist at [facility] to see if she can be seen today prior to her MRI, which is scheduled for this afternoon.  If this is not possible, she may consider going to their ER following her MRI so that she can be seen by a Neurologist at their site today. Condition on Discharge: Stable Tests Needing Follow-up: MRI later today at [facility] Signed: [place] 03/12/2014, 7:53 AM

## 2014-03-13 LAB — ALDOLASE: ALDOLASE: 2.7 U/L (ref <=8.1)

## 2014-03-25 ENCOUNTER — Ambulatory Visit: Payer: BC Managed Care – PPO | Admitting: Infectious Diseases

## 2014-03-29 ENCOUNTER — Other Ambulatory Visit: Payer: Self-pay | Admitting: Infectious Diseases

## 2014-03-29 DIAGNOSIS — M6281 Muscle weakness (generalized): Secondary | ICD-10-CM

## 2014-03-29 DIAGNOSIS — G894 Chronic pain syndrome: Secondary | ICD-10-CM

## 2014-03-29 DIAGNOSIS — R270 Ataxia, unspecified: Secondary | ICD-10-CM

## 2014-03-29 DIAGNOSIS — R209 Unspecified disturbances of skin sensation: Secondary | ICD-10-CM

## 2014-03-29 DIAGNOSIS — G1229 Other motor neuron disease: Secondary | ICD-10-CM

## 2014-03-29 DIAGNOSIS — IMO0002 Reserved for concepts with insufficient information to code with codable children: Secondary | ICD-10-CM

## 2014-04-10 ENCOUNTER — Ambulatory Visit
Admission: RE | Admit: 2014-04-10 | Discharge: 2014-04-10 | Disposition: A | Discharge status: Home / Self Care | Payer: BC Managed Care – PPO | Source: Ambulatory Visit | Attending: Infectious Diseases | Admitting: Infectious Diseases

## 2014-04-10 DIAGNOSIS — R270 Ataxia, unspecified: Secondary | ICD-10-CM

## 2014-04-10 DIAGNOSIS — R209 Unspecified disturbances of skin sensation: Secondary | ICD-10-CM

## 2014-04-10 DIAGNOSIS — G1229 Other motor neuron disease: Secondary | ICD-10-CM

## 2014-04-10 DIAGNOSIS — G894 Chronic pain syndrome: Secondary | ICD-10-CM

## 2014-04-10 DIAGNOSIS — IMO0002 Reserved for concepts with insufficient information to code with codable children: Secondary | ICD-10-CM

## 2014-04-10 DIAGNOSIS — M6281 Muscle weakness (generalized): Secondary | ICD-10-CM

## 2014-04-10 MED ORDER — GADOBENATE DIMEGLUMINE 529 MG/ML IV SOLN
13.0000 mL | Freq: Once | INTRAVENOUS | Status: AC | PRN
Start: 2014-04-10 — End: 2014-04-10
  Administered 2014-04-10: 13 mL via INTRAVENOUS

## 2014-04-10 MED ORDER — GADOBENATE DIMEGLUMINE 529 MG/ML IV SOLN
13.0000 mL | Freq: Once | INTRAVENOUS | Status: AC | PRN
  Administered 2014-04-10: 13 mL via INTRAVENOUS

## 2014-04-29 ENCOUNTER — Ambulatory Visit
Payer: BC Managed Care – PPO | Attending: Infectious Diseases | Admitting: Rehabilitative and Restorative Service Providers"

## 2014-04-29 DIAGNOSIS — M6281 Muscle weakness (generalized): Secondary | ICD-10-CM | POA: Insufficient documentation

## 2014-04-29 DIAGNOSIS — IMO0001 Reserved for inherently not codable concepts without codable children: Secondary | ICD-10-CM | POA: Insufficient documentation

## 2014-04-29 DIAGNOSIS — R269 Unspecified abnormalities of gait and mobility: Secondary | ICD-10-CM | POA: Insufficient documentation

## 2014-04-29 DIAGNOSIS — A6920 Lyme disease, unspecified: Secondary | ICD-10-CM | POA: Diagnosis not present

## 2014-05-06 ENCOUNTER — Ambulatory Visit: Payer: BC Managed Care – PPO | Admitting: Physical Therapy

## 2014-05-06 DIAGNOSIS — IMO0001 Reserved for inherently not codable concepts without codable children: Secondary | ICD-10-CM | POA: Diagnosis not present

## 2014-05-08 ENCOUNTER — Ambulatory Visit: Payer: BC Managed Care – PPO | Admitting: Physical Therapy

## 2014-05-08 DIAGNOSIS — IMO0001 Reserved for inherently not codable concepts without codable children: Secondary | ICD-10-CM | POA: Diagnosis not present

## 2014-05-10 ENCOUNTER — Ambulatory Visit: Payer: BC Managed Care – PPO | Admitting: Physical Therapy

## 2014-05-13 ENCOUNTER — Ambulatory Visit: Payer: BC Managed Care – PPO | Admitting: Physical Therapy

## 2014-05-15 ENCOUNTER — Ambulatory Visit: Payer: BC Managed Care – PPO | Admitting: Rehabilitative and Restorative Service Providers"

## 2014-05-15 DIAGNOSIS — IMO0001 Reserved for inherently not codable concepts without codable children: Secondary | ICD-10-CM | POA: Diagnosis not present

## 2014-05-17 ENCOUNTER — Ambulatory Visit: Payer: BC Managed Care – PPO | Admitting: Rehabilitative and Restorative Service Providers"

## 2014-05-17 DIAGNOSIS — IMO0001 Reserved for inherently not codable concepts without codable children: Secondary | ICD-10-CM | POA: Diagnosis not present

## 2014-05-20 ENCOUNTER — Ambulatory Visit: Payer: BC Managed Care – PPO | Admitting: Rehabilitative and Restorative Service Providers"

## 2014-05-20 DIAGNOSIS — IMO0001 Reserved for inherently not codable concepts without codable children: Secondary | ICD-10-CM | POA: Diagnosis not present

## 2014-05-22 ENCOUNTER — Ambulatory Visit: Payer: BC Managed Care – PPO | Admitting: Physical Therapy

## 2014-05-22 DIAGNOSIS — IMO0001 Reserved for inherently not codable concepts without codable children: Secondary | ICD-10-CM | POA: Diagnosis not present

## 2014-05-27 ENCOUNTER — Ambulatory Visit: Payer: BC Managed Care – PPO | Admitting: Rehabilitative and Restorative Service Providers"

## 2014-05-29 ENCOUNTER — Ambulatory Visit
Payer: BC Managed Care – PPO | Attending: Infectious Diseases | Admitting: Rehabilitative and Restorative Service Providers"

## 2014-05-29 DIAGNOSIS — M6281 Muscle weakness (generalized): Secondary | ICD-10-CM | POA: Insufficient documentation

## 2014-05-29 DIAGNOSIS — IMO0001 Reserved for inherently not codable concepts without codable children: Secondary | ICD-10-CM | POA: Insufficient documentation

## 2014-05-29 DIAGNOSIS — A6920 Lyme disease, unspecified: Secondary | ICD-10-CM | POA: Insufficient documentation

## 2014-05-29 DIAGNOSIS — R269 Unspecified abnormalities of gait and mobility: Secondary | ICD-10-CM | POA: Diagnosis not present

## 2014-06-03 ENCOUNTER — Ambulatory Visit: Payer: BC Managed Care – PPO | Admitting: Rehabilitative and Restorative Service Providers"

## 2014-06-03 DIAGNOSIS — IMO0001 Reserved for inherently not codable concepts without codable children: Secondary | ICD-10-CM | POA: Diagnosis not present

## 2014-06-05 ENCOUNTER — Ambulatory Visit: Payer: BC Managed Care – PPO | Admitting: Rehabilitative and Restorative Service Providers"

## 2014-06-05 DIAGNOSIS — IMO0001 Reserved for inherently not codable concepts without codable children: Secondary | ICD-10-CM | POA: Diagnosis not present

## 2014-06-10 ENCOUNTER — Ambulatory Visit: Payer: BC Managed Care – PPO | Admitting: Physical Therapy

## 2014-06-10 DIAGNOSIS — IMO0001 Reserved for inherently not codable concepts without codable children: Secondary | ICD-10-CM | POA: Diagnosis not present

## 2014-06-12 ENCOUNTER — Ambulatory Visit: Payer: BC Managed Care – PPO | Admitting: Physical Therapy

## 2014-06-12 DIAGNOSIS — IMO0001 Reserved for inherently not codable concepts without codable children: Secondary | ICD-10-CM | POA: Diagnosis not present

## 2014-06-18 ENCOUNTER — Ambulatory Visit: Payer: BC Managed Care – PPO | Admitting: Physical Therapy

## 2014-06-20 ENCOUNTER — Ambulatory Visit: Payer: BC Managed Care – PPO | Admitting: Physical Therapy

## 2014-06-20 DIAGNOSIS — IMO0001 Reserved for inherently not codable concepts without codable children: Secondary | ICD-10-CM | POA: Diagnosis not present

## 2014-06-25 ENCOUNTER — Ambulatory Visit: Payer: BC Managed Care – PPO | Attending: Infectious Diseases | Admitting: Physical Therapy

## 2014-06-25 DIAGNOSIS — R269 Unspecified abnormalities of gait and mobility: Secondary | ICD-10-CM | POA: Insufficient documentation

## 2014-06-25 DIAGNOSIS — M6281 Muscle weakness (generalized): Secondary | ICD-10-CM | POA: Diagnosis not present

## 2014-06-25 DIAGNOSIS — A6920 Lyme disease, unspecified: Secondary | ICD-10-CM | POA: Diagnosis not present

## 2014-06-25 DIAGNOSIS — IMO0001 Reserved for inherently not codable concepts without codable children: Secondary | ICD-10-CM | POA: Insufficient documentation

## 2014-06-27 ENCOUNTER — Ambulatory Visit: Payer: BC Managed Care – PPO | Admitting: Physical Therapy

## 2014-06-27 DIAGNOSIS — IMO0001 Reserved for inherently not codable concepts without codable children: Secondary | ICD-10-CM | POA: Diagnosis not present

## 2014-07-02 ENCOUNTER — Ambulatory Visit: Payer: BC Managed Care – PPO | Admitting: Rehabilitative and Restorative Service Providers"

## 2014-07-02 DIAGNOSIS — IMO0001 Reserved for inherently not codable concepts without codable children: Secondary | ICD-10-CM | POA: Diagnosis not present

## 2014-07-04 ENCOUNTER — Ambulatory Visit: Payer: BC Managed Care – PPO | Admitting: Rehabilitative and Restorative Service Providers"

## 2014-07-04 DIAGNOSIS — IMO0001 Reserved for inherently not codable concepts without codable children: Secondary | ICD-10-CM | POA: Diagnosis not present

## 2014-07-08 ENCOUNTER — Ambulatory Visit: Payer: BC Managed Care – PPO | Admitting: Physical Therapy

## 2014-07-16 ENCOUNTER — Ambulatory Visit: Payer: BC Managed Care – PPO | Admitting: Rehabilitative and Restorative Service Providers"

## 2014-07-16 DIAGNOSIS — IMO0001 Reserved for inherently not codable concepts without codable children: Secondary | ICD-10-CM | POA: Diagnosis not present

## 2014-07-19 ENCOUNTER — Ambulatory Visit: Payer: BC Managed Care – PPO | Admitting: Rehabilitative and Restorative Service Providers"

## 2014-07-19 DIAGNOSIS — IMO0001 Reserved for inherently not codable concepts without codable children: Secondary | ICD-10-CM | POA: Diagnosis not present

## 2014-07-23 ENCOUNTER — Ambulatory Visit: Payer: BC Managed Care – PPO | Admitting: Physical Therapy

## 2014-07-23 DIAGNOSIS — IMO0001 Reserved for inherently not codable concepts without codable children: Secondary | ICD-10-CM | POA: Diagnosis not present

## 2014-07-26 ENCOUNTER — Ambulatory Visit
Payer: BC Managed Care – PPO | Attending: Infectious Diseases | Admitting: Rehabilitative and Restorative Service Providers"

## 2014-07-26 DIAGNOSIS — R269 Unspecified abnormalities of gait and mobility: Secondary | ICD-10-CM | POA: Insufficient documentation

## 2014-07-26 DIAGNOSIS — M6281 Muscle weakness (generalized): Secondary | ICD-10-CM | POA: Insufficient documentation

## 2014-07-26 DIAGNOSIS — A6929 Other conditions associated with Lyme disease: Secondary | ICD-10-CM | POA: Insufficient documentation

## 2014-07-30 ENCOUNTER — Ambulatory Visit: Payer: BC Managed Care – PPO | Admitting: Physical Therapy

## 2014-07-30 DIAGNOSIS — A6929 Other conditions associated with Lyme disease: Secondary | ICD-10-CM | POA: Diagnosis present

## 2014-07-30 DIAGNOSIS — M6281 Muscle weakness (generalized): Secondary | ICD-10-CM | POA: Diagnosis present

## 2014-07-30 DIAGNOSIS — R269 Unspecified abnormalities of gait and mobility: Secondary | ICD-10-CM | POA: Diagnosis present

## 2014-08-05 ENCOUNTER — Ambulatory Visit: Payer: BC Managed Care – PPO | Admitting: Rehabilitative and Restorative Service Providers"

## 2014-08-05 DIAGNOSIS — A6929 Other conditions associated with Lyme disease: Secondary | ICD-10-CM | POA: Diagnosis not present

## 2014-08-07 ENCOUNTER — Ambulatory Visit: Payer: BC Managed Care – PPO | Admitting: Rehabilitative and Restorative Service Providers"

## 2014-08-12 ENCOUNTER — Ambulatory Visit: Payer: BC Managed Care – PPO | Admitting: Physical Therapy

## 2014-08-13 ENCOUNTER — Ambulatory Visit: Payer: BC Managed Care – PPO | Admitting: Rehabilitative and Restorative Service Providers"

## 2014-08-13 DIAGNOSIS — A6929 Other conditions associated with Lyme disease: Secondary | ICD-10-CM | POA: Diagnosis not present

## 2014-08-19 ENCOUNTER — Ambulatory Visit: Payer: BC Managed Care – PPO | Admitting: Physical Therapy

## 2014-08-20 ENCOUNTER — Ambulatory Visit: Payer: BC Managed Care – PPO | Admitting: Rehabilitative and Restorative Service Providers"

## 2014-08-26 ENCOUNTER — Encounter (HOSPITAL_COMMUNITY): Payer: Self-pay

## 2016-08-09 ENCOUNTER — Emergency Department (HOSPITAL_COMMUNITY)
Admission: EM | Admit: 2016-08-09 | Discharge: 2016-08-09 | Disposition: A | Discharge status: Home / Self Care | Payer: 59 | Attending: Emergency Medicine | Admitting: Emergency Medicine

## 2016-08-09 ENCOUNTER — Emergency Department (HOSPITAL_COMMUNITY): Payer: 59

## 2016-08-09 ENCOUNTER — Encounter (HOSPITAL_COMMUNITY): Payer: Self-pay | Admitting: Emergency Medicine

## 2016-08-09 DIAGNOSIS — N9089 Other specified noninflammatory disorders of vulva and perineum: Secondary | ICD-10-CM

## 2016-08-09 DIAGNOSIS — R229 Localized swelling, mass and lump, unspecified: Secondary | ICD-10-CM

## 2016-08-09 DIAGNOSIS — IMO0002 Reserved for concepts with insufficient information to code with codable children: Secondary | ICD-10-CM

## 2016-08-09 LAB — URINALYSIS, ROUTINE W REFLEX MICROSCOPIC
Bilirubin Urine: NEGATIVE
GLUCOSE, UA: NEGATIVE mg/dL
KETONES UR: NEGATIVE mg/dL
NITRITE: NEGATIVE
PROTEIN: NEGATIVE mg/dL
Specific Gravity, Urine: 1.02 (ref 1.005–1.030)
pH: 6 (ref 5.0–8.0)

## 2016-08-09 LAB — CBC WITH DIFFERENTIAL/PLATELET
Basophils Absolute: 0 10*3/uL (ref 0.0–0.1)
Basophils Relative: 0 %
Eosinophils Absolute: 0.2 10*3/uL (ref 0.0–0.7)
Eosinophils Relative: 2 %
HEMATOCRIT: 40.9 % (ref 36.0–46.0)
Hemoglobin: 14.2 g/dL (ref 12.0–15.0)
LYMPHS PCT: 27 %
Lymphs Abs: 2.3 10*3/uL (ref 0.7–4.0)
MCH: 30.6 pg (ref 26.0–34.0)
MCHC: 34.7 g/dL (ref 30.0–36.0)
MCV: 88.1 fL (ref 78.0–100.0)
Monocytes Absolute: 0.5 10*3/uL (ref 0.1–1.0)
Monocytes Relative: 6 %
NEUTROS ABS: 5.6 10*3/uL (ref 1.7–7.7)
Neutrophils Relative %: 65 %
Platelets: 266 10*3/uL (ref 150–400)
RBC: 4.64 MIL/uL (ref 3.87–5.11)
RDW: 13.1 % (ref 11.5–15.5)
WBC: 8.6 10*3/uL (ref 4.0–10.5)

## 2016-08-09 LAB — COMPREHENSIVE METABOLIC PANEL
ALBUMIN: 4.3 g/dL (ref 3.5–5.0)
ALT: 12 U/L — ABNORMAL LOW (ref 14–54)
ANION GAP: 8 (ref 5–15)
AST: 21 U/L (ref 15–41)
Alkaline Phosphatase: 55 U/L (ref 38–126)
BUN: 13 mg/dL (ref 6–20)
CHLORIDE: 105 mmol/L (ref 101–111)
CO2: 26 mmol/L (ref 22–32)
Calcium: 9.5 mg/dL (ref 8.9–10.3)
Creatinine, Ser: 0.84 mg/dL (ref 0.44–1.00)
GFR calc Af Amer: >60 mL/min (ref >60)
Glucose, Bld: 78 mg/dL (ref 65–99)
POTASSIUM: 3.7 mmol/L (ref 3.5–5.1)
Sodium: 139 mmol/L (ref 135–145)
Total Bilirubin: 0.6 mg/dL (ref 0.3–1.2)
Total Protein: 7.7 g/dL (ref 6.5–8.1)

## 2016-08-09 LAB — PROTIME-INR
INR: 0.88
Prothrombin Time: 11.9 seconds (ref 11.4–15.2)

## 2016-08-09 LAB — POC URINE PREG, ED: PREG TEST UR: NEGATIVE

## 2016-08-09 LAB — URINE MICROSCOPIC-ADD ON

## 2016-08-09 LAB — LIPASE, BLOOD: Lipase: 26 U/L (ref 11–51)

## 2016-08-09 MED ORDER — TRAMADOL HCL 50 MG PO TABS: 50.0000 mg | ORAL_TABLET | Freq: Four times a day (QID) | ORAL | 0 refills | Status: DC | PRN

## 2016-08-09 NOTE — ED Triage Notes (Signed)
Pt sts blood clot in perineum area x 2 days; pt sts hx of same in past 2 year ago

## 2016-08-09 NOTE — Discharge Instructions (Signed)
You were seen in the ED today with a mass in the perineum. We performed an ultrasound of the area but could not clearly identify it. We are concerned that this is a repeat blood clot and would like for you to follow up with general surgery, Dr. Lucia Gaskins, who performed the surgery last time.   Return to the ED with any new or worsening pain, fever, chills, or abdominal pain.

## 2016-08-09 NOTE — ED Provider Notes (Signed)
Emergency Department Provider Note   I have reviewed the triage vital signs and the nursing notes.   HISTORY  Chief Complaint Rectal Pain   HPI Lindsay Giles is a 40 y.o. female with PMH of menorrhagia and prior rectal hematoma presents to the emergency department for evaluation of return of perineal mass and pain. Symptoms began yesterday. Patient states the area of swelling is more in her perineum. She called the surgery office and was referred to the emergency department. She did follow up with her OB/GYN today who performed an in office ultrasound and shows a mass in the perineum. No fevers or chills. No drainage from the area. No trauma. Patient states that she did have some constipation yesterday but no additional reason to develop this.    Past Medical History:  Diagnosis Date  . Abnormal Pap smear   . Menorrhagia     Patient Active Problem List   Diagnosis Date Noted  . Arthralgia 03/10/2014  . Abdominal pain 03/10/2014  . Headache 03/10/2014  . Depression 03/10/2014  . Reflux esophagitis 03/10/2014  . Rectal pain 02/15/2014  . Menorrhagia     Past Surgical History:  Procedure Laterality Date  . DILATION AND CURETTAGE OF UTERUS    . EXAMINATION UNDER ANESTHESIA N/A 02/17/2014   Procedure: EXAM UNDER ANESTHESIA,  ULTRASOUND OF RECTUM, INCISION AND EXCISION OF THROMBOSED CLOT ;  Surgeon: Shann Medal, MD;  Location: [facility];  Service: General;  Laterality: N/A;  . TONSILLECTOMY AND ADENOIDECTOMY      Current Outpatient Rx  . Order #: [id number] Class: Historical Med  . Order #: [id number] Class: Historical Med  . Order #: [id number] Class: Historical Med  . Order #: [id number] Class: No Print  . Order #: [id number] Class: Historical Med  . Order #: [id number] Class: Normal  . Order #: [id number] Class: Historical Med  . Order #: [id number] Class: No Print  . Order #: [id number] Class: Normal  . Order #: [id number] Class: Print    Allergies Atrovent  [ipratropium]  Family History  Problem Relation Age of Onset  . Hypothyroidism Mother   . Breast cancer Paternal Grandmother   . Cancer Paternal Grandmother 70    breast    Social History Social History  Substance Use Topics  . Smoking status: Never Smoker  . Smokeless tobacco: Never Used  . Alcohol use 0.5 oz/week    1 Standard drinks or equivalent per week    Review of Systems  Constitutional: No fever/chills Eyes: No visual changes. ENT: No sore throat. Cardiovascular: Denies chest pain. Respiratory: Denies shortness of breath. Gastrointestinal: No abdominal pain.  No nausea, no vomiting.  No diarrhea.  No constipation. Genitourinary: Negative for dysuria. Recto-vaginal pain and mass Musculoskeletal: Negative for back pain. Skin: Negative for rash. Neurological: Negative for headaches, focal weakness or numbness.  10-point ROS otherwise negative.  ____________________________________________   PHYSICAL EXAM:  VITAL SIGNS: ED Triage Vitals  Enc Vitals Group     BP 08/09/16 1236 104/84     Pulse Rate 08/09/16 1236 93     Resp 08/09/16 1236 18     Temp 08/09/16 1236 98.2 F (36.8 C)     Temp Source 08/09/16 1236 Oral     SpO2 08/09/16 1236 100 %     Pain Score 08/09/16 1237 3   Constitutional: Alert and oriented. Well appearing and in no acute distress. Eyes: Conjunctivae are normal.  Head: Atraumatic. Nose: No congestion/rhinnorhea. Mouth/Throat: Mucous membranes are moist.  Oropharynx non-erythematous. Neck: No stridor.   Cardiovascular: Normal  rate, regular rhythm. Good peripheral circulation. Grossly normal heart sounds.   Respiratory: Normal respiratory effort.  No retractions. Lungs CTAB. Gastrointestinal: Soft and nontender. No distention. 1.5 cm mildly tender mass in the perineum slightly to the right side.  Musculoskeletal: No lower extremity tenderness nor edema. No gross deformities of extremities. Neurologic:  Normal speech and language. No  gross focal neurologic deficits are appreciated.  Skin:  Skin is warm, dry and intact. No rash noted. Psychiatric: Mood and affect are normal. Speech and behavior are normal.  ____________________________________________   LABS (all labs ordered are listed, but only abnormal results are displayed)  Labs Reviewed  COMPREHENSIVE METABOLIC PANEL - Abnormal; Notable for the following:       Result Value   ALT 12 (*)    All other components within normal limits  URINALYSIS, ROUTINE W REFLEX MICROSCOPIC (NOT AT [facility]) - Abnormal; Notable for the following:    Color, Urine AMBER (*)    APPearance CLOUDY (*)    Hgb urine dipstick LARGE (*)    Leukocytes, UA MODERATE (*)    All other components within normal limits  URINE MICROSCOPIC-ADD ON - Abnormal; Notable for the following:    Squamous Epithelial / LPF 6-30 (*)    Bacteria, UA MANY (*)    All other components within normal limits  LIPASE, BLOOD  CBC WITH DIFFERENTIAL/PLATELET  PROTIME-INR  POC URINE PREG, ED   ____________________________________________  RADIOLOGY  US Transvaginal Non-ob  Result Date: 08/09/2016 CLINICAL DATA:  Palpable painful abnormality/mass at perineum, history of perineal clot EXAM: TRANSABDOMINAL AND TRANSVAGINAL ULTRASOUND OF PELVIS TECHNIQUE: Both transabdominal and transvaginal ultrasound examinations of the pelvis were performed. Transabdominal technique was performed for global imaging of the pelvis including uterus, ovaries, adnexal regions, and pelvic cul-de-sac. It was necessary to proceed with endovaginal exam following the transabdominal exam to visualize the endometrium and ovaries. Additional imaging of the perineum was performed. COMPARISON:  None FINDINGS: Uterus Measurements: 7.1 x 4.4 x 4.7 cm. Small probable leiomyoma at uterine fundus anteriorly measuring 1.7 x 1.7 x 1.9 cm. Endometrium Thickness: 4 mm thick, normal. No endometrial fluid or focal abnormality Right ovary Measurements: 2.8 x 1.6  x 2.6 cm. Tiny hemorrhagic follicle 11 mm diameter. Otherwise unremarkable. Internal blood flow present on color Doppler imaging. Left ovary Not visualized on either transabdominal or endovaginal imaging question obscured by bowel. Other findings No free pelvic fluid or adnexal masses. Imaging of the perineum was performed. Between the vaginal introitus and the anus, at the site of palpable concern, sonographically unremarkable soft tissue is identified. No definite sonographic mass, calcification, cystic lesion or fluid collection is delineated. Source of palpable concern is not identified. IMPRESSION: Small fundal probable uterine leiomyoma 1.9 cm greatest size. Nonvisualization of LEFT ovary. No definite peritoneal soft tissue abnormalities are sonographically evident. If better soft tissue visualization is required consider followup MR. Electronically Signed   By: Lavonia Dana M.D.   On: 08/09/2016 19:40   US Pelvis Complete  Result Date: 08/09/2016 CLINICAL DATA:  Palpable painful abnormality/mass at perineum, history of perineal clot EXAM: TRANSABDOMINAL AND TRANSVAGINAL ULTRASOUND OF PELVIS TECHNIQUE: Both transabdominal and transvaginal ultrasound examinations of the pelvis were performed. Transabdominal technique was performed for global imaging of the pelvis including uterus, ovaries, adnexal regions, and pelvic cul-de-sac. It was necessary to proceed with endovaginal exam following the transabdominal exam to visualize the endometrium and ovaries. Additional imaging of the perineum was performed. COMPARISON:  None FINDINGS: Uterus Measurements: 7.1 x 4.4 x  4.7 cm. Small probable leiomyoma at uterine fundus anteriorly measuring 1.7 x 1.7 x 1.9 cm. Endometrium Thickness: 4 mm thick, normal. No endometrial fluid or focal abnormality Right ovary Measurements: 2.8 x 1.6 x 2.6 cm. Tiny hemorrhagic follicle 11 mm diameter. Otherwise unremarkable. Internal blood flow present on color Doppler imaging. Left  ovary Not visualized on either transabdominal or endovaginal imaging question obscured by bowel. Other findings No free pelvic fluid or adnexal masses. Imaging of the perineum was performed. Between the vaginal introitus and the anus, at the site of palpable concern, sonographically unremarkable soft tissue is identified. No definite sonographic mass, calcification, cystic lesion or fluid collection is delineated. Source of palpable concern is not identified. IMPRESSION: Small fundal probable uterine leiomyoma 1.9 cm greatest size. Nonvisualization of LEFT ovary. No definite peritoneal soft tissue abnormalities are sonographically evident. If better soft tissue visualization is required consider followup MR. Electronically Signed   By: Lavonia Dana M.D.   On: 08/09/2016 19:40    ____________________________________________   PROCEDURES  Procedure(s) performed:   Procedures  None ____________________________________________   INITIAL IMPRESSION / ASSESSMENT AND PLAN / ED COURSE  Pertinent labs & imaging results that were available during my care of the patient were reviewed by me and considered in my medical decision making (see chart for details).  Patient resents to the emergency department for evaluation of palpable perineal mass that is painful. The patient noticed this started yesterday. Similar to prior episode in March 2015 where she was found to have a subcutaneous hematoma that was incised and removed under general anesthesia. No evidence on my exam or by history to suggest perirectal abscess. The mass is best appreciated on pelvic exam by palpation along the rectovaginal border slightly to the right. Discussed with radiology regarding initial imaging study and they recommended pelvic and transvaginal ultrasound. Will send for this study and reassess afterwards.  07:46 PM Reviewed [place] report. No clear etiology of mass on imaging. Consulting general surgery given similar problem in the  past with surgical I&D.   07:55 PM Discussed case with Dr. Grandville Silos by phone. Patient will follow with Dr. Lucia Gaskins in the office for further evaluation. Discussed this in detail with the patient who is comfortable with this plan. Her pain is well controlled at this point with no medication given in the ED. Plan to discharge with Tramadol and surgery follow up.   At this time, I do not feel there is any life-threatening condition present. I have reviewed and discussed all results (EKG, imaging, lab, urine as appropriate), exam findings with patient. I have reviewed nursing notes and appropriate previous records.  I feel the patient is safe to be discharged home without further emergent workup. Discussed usual and customary return precautions. Patient and family (if present) verbalize understanding and are comfortable with this plan.  Patient will follow-up with their primary care provider. If they do not have a primary care provider, information for follow-up has been provided to them. All questions have been answered.  ____________________________________________  FINAL CLINICAL IMPRESSION(S) / ED DIAGNOSES  Final diagnoses:  Perineal mass in female     MEDICATIONS GIVEN DURING THIS VISIT:  None  NEW OUTPATIENT MEDICATIONS STARTED DURING THIS VISIT:  Discharge Medication List as of 08/09/2016  8:06 PM    START taking these medications   Details  traMADol (ULTRAM) 50 MG tablet Take 1 tablet (50 mg total) by mouth every 6 (six) hours as needed., Starting Mon 08/09/2016, Print  Note:  This document was prepared using Dragon voice recognition software and may include unintentional dictation errors.  Nanda Quinton, MD Emergency Medicine   Margette Fast, MD 08/09/16 [phone]

## 2016-08-12 ENCOUNTER — Other Ambulatory Visit: Payer: Self-pay | Admitting: Surgery

## 2016-08-12 DIAGNOSIS — N9089 Other specified noninflammatory disorders of vulva and perineum: Secondary | ICD-10-CM

## 2017-06-30 ENCOUNTER — Ambulatory Visit (INDEPENDENT_AMBULATORY_CARE_PROVIDER_SITE_OTHER): Payer: 59 | Admitting: Psychology

## 2017-06-30 DIAGNOSIS — F411 Generalized anxiety disorder: Secondary | ICD-10-CM

## 2017-07-06 ENCOUNTER — Ambulatory Visit (INDEPENDENT_AMBULATORY_CARE_PROVIDER_SITE_OTHER): Payer: 59 | Admitting: Psychology

## 2017-07-06 DIAGNOSIS — F411 Generalized anxiety disorder: Secondary | ICD-10-CM | POA: Diagnosis not present

## 2017-07-19 ENCOUNTER — Ambulatory Visit (INDEPENDENT_AMBULATORY_CARE_PROVIDER_SITE_OTHER): Payer: 59 | Admitting: Psychology

## 2017-07-19 DIAGNOSIS — F411 Generalized anxiety disorder: Secondary | ICD-10-CM | POA: Diagnosis not present

## 2017-07-19 DIAGNOSIS — Z630 Problems in relationship with spouse or partner: Secondary | ICD-10-CM | POA: Diagnosis not present

## 2017-08-09 ENCOUNTER — Ambulatory Visit: Payer: 59 | Admitting: Psychology

## 2017-08-15 ENCOUNTER — Ambulatory Visit (INDEPENDENT_AMBULATORY_CARE_PROVIDER_SITE_OTHER): Payer: 59 | Admitting: Psychology

## 2017-08-15 DIAGNOSIS — F411 Generalized anxiety disorder: Secondary | ICD-10-CM | POA: Diagnosis not present

## 2017-08-23 ENCOUNTER — Ambulatory Visit (INDEPENDENT_AMBULATORY_CARE_PROVIDER_SITE_OTHER): Payer: 59 | Admitting: Psychology

## 2017-08-23 DIAGNOSIS — Z630 Problems in relationship with spouse or partner: Secondary | ICD-10-CM | POA: Diagnosis not present

## 2017-08-23 DIAGNOSIS — F411 Generalized anxiety disorder: Secondary | ICD-10-CM | POA: Diagnosis not present

## 2017-08-30 ENCOUNTER — Ambulatory Visit: Payer: 59 | Admitting: Psychology

## 2017-09-08 ENCOUNTER — Ambulatory Visit (INDEPENDENT_AMBULATORY_CARE_PROVIDER_SITE_OTHER): Payer: 59 | Admitting: Psychology

## 2017-09-08 DIAGNOSIS — F411 Generalized anxiety disorder: Secondary | ICD-10-CM

## 2017-09-08 DIAGNOSIS — Z630 Problems in relationship with spouse or partner: Secondary | ICD-10-CM | POA: Diagnosis not present

## 2017-09-13 ENCOUNTER — Ambulatory Visit (INDEPENDENT_AMBULATORY_CARE_PROVIDER_SITE_OTHER): Payer: 59 | Admitting: Psychology

## 2017-09-13 DIAGNOSIS — F411 Generalized anxiety disorder: Secondary | ICD-10-CM | POA: Diagnosis not present

## 2017-09-28 ENCOUNTER — Ambulatory Visit (INDEPENDENT_AMBULATORY_CARE_PROVIDER_SITE_OTHER): Payer: 59 | Admitting: Psychology

## 2017-09-28 DIAGNOSIS — F411 Generalized anxiety disorder: Secondary | ICD-10-CM

## 2017-09-30 ENCOUNTER — Other Ambulatory Visit: Payer: Self-pay | Admitting: Obstetrics and Gynecology

## 2017-10-06 ENCOUNTER — Ambulatory Visit: Payer: 59 | Admitting: Psychology

## 2018-01-14 ENCOUNTER — Emergency Department (HOSPITAL_COMMUNITY)
Admission: EM | Admit: 2018-01-14 | Discharge: 2018-01-14 | Disposition: A | Discharge status: Home / Self Care | Payer: 59 | Attending: Emergency Medicine | Admitting: Emergency Medicine

## 2018-01-14 ENCOUNTER — Encounter (HOSPITAL_COMMUNITY): Payer: Self-pay | Admitting: Emergency Medicine

## 2018-01-14 DIAGNOSIS — R112 Nausea with vomiting, unspecified: Secondary | ICD-10-CM | POA: Insufficient documentation

## 2018-01-14 DIAGNOSIS — K5900 Constipation, unspecified: Secondary | ICD-10-CM | POA: Insufficient documentation

## 2018-01-14 DIAGNOSIS — R1013 Epigastric pain: Secondary | ICD-10-CM | POA: Diagnosis not present

## 2018-01-14 DIAGNOSIS — Z79899 Other long term (current) drug therapy: Secondary | ICD-10-CM | POA: Diagnosis not present

## 2018-01-14 LAB — COMPREHENSIVE METABOLIC PANEL
ALBUMIN: 3.9 g/dL (ref 3.5–5.0)
ALK PHOS: 48 U/L (ref 38–126)
ALT: 12 U/L — ABNORMAL LOW (ref 14–54)
AST: 21 U/L (ref 15–41)
Anion gap: 9 (ref 5–15)
BILIRUBIN TOTAL: 0.2 mg/dL — AB (ref 0.3–1.2)
BUN: 9 mg/dL (ref 6–20)
CALCIUM: 9 mg/dL (ref 8.9–10.3)
CO2: 23 mmol/L (ref 22–32)
CREATININE: 0.74 mg/dL (ref 0.44–1.00)
Chloride: 106 mmol/L (ref 101–111)
GFR calc Af Amer: >60 mL/min (ref >60)
GFR calc non Af Amer: >60 mL/min (ref >60)
GLUCOSE: 86 mg/dL (ref 65–99)
Potassium: 3.5 mmol/L (ref 3.5–5.1)
SODIUM: 138 mmol/L (ref 135–145)
TOTAL PROTEIN: 6.5 g/dL (ref 6.5–8.1)

## 2018-01-14 LAB — URINALYSIS, ROUTINE W REFLEX MICROSCOPIC
Bilirubin Urine: NEGATIVE
GLUCOSE, UA: NEGATIVE mg/dL
Ketones, ur: NEGATIVE mg/dL
Leukocytes, UA: NEGATIVE
Nitrite: NEGATIVE
PH: 5 (ref 5.0–8.0)
PROTEIN: NEGATIVE mg/dL
SPECIFIC GRAVITY, URINE: 1.016 (ref 1.005–1.030)

## 2018-01-14 LAB — CBC
HCT: 40.3 % (ref 36.0–46.0)
Hemoglobin: 13.2 g/dL (ref 12.0–15.0)
MCH: 29.8 pg (ref 26.0–34.0)
MCHC: 32.8 g/dL (ref 30.0–36.0)
MCV: 91 fL (ref 78.0–100.0)
PLATELETS: 265 10*3/uL (ref 150–400)
RBC: 4.43 MIL/uL (ref 3.87–5.11)
RDW: 13.3 % (ref 11.5–15.5)
WBC: 7.9 10*3/uL (ref 4.0–10.5)

## 2018-01-14 LAB — I-STAT BETA HCG BLOOD, ED (MC, WL, AP ONLY): I-stat hCG, quantitative: <5 m[IU]/mL (ref <5)

## 2018-01-14 LAB — LIPASE, BLOOD: Lipase: 26 U/L (ref 11–51)

## 2018-01-14 MED ORDER — PROMETHAZINE HCL 25 MG PO TABS: 25.0000 mg | ORAL_TABLET | Freq: Four times a day (QID) | ORAL | 0 refills | Status: DC | PRN

## 2018-01-14 MED ORDER — GI COCKTAIL ~~LOC~~
30.0000 mL | Freq: Once | ORAL | Status: AC
  Administered 2018-01-14: 30 mL via ORAL
  Filled 2018-01-14: qty 30

## 2018-01-14 MED ORDER — FAMOTIDINE 20 MG PO TABS: 20.0000 mg | ORAL_TABLET | Freq: Two times a day (BID) | ORAL | 0 refills | Status: DC

## 2018-01-14 MED ORDER — ONDANSETRON 4 MG PO TBDP
4.0000 mg | ORAL_TABLET | Freq: Once | ORAL | Status: AC | PRN
  Administered 2018-01-14: 4 mg via ORAL
  Filled 2018-01-14: qty 1

## 2018-01-14 MED ORDER — OMEPRAZOLE 20 MG PO CPDR: 20.0000 mg | DELAYED_RELEASE_CAPSULE | Freq: Every day | ORAL | 7 refills | Status: DC

## 2018-01-14 MED ORDER — PROMETHAZINE HCL 25 MG PO TABS
25.0000 mg | ORAL_TABLET | Freq: Once | ORAL | Status: AC
  Administered 2018-01-14: 25 mg via ORAL
  Filled 2018-01-14: qty 1

## 2018-01-14 MED ORDER — SUCRALFATE 1 GM/10ML PO SUSP: 1.0000 g | Freq: Three times a day (TID) | ORAL | 0 refills | Status: DC

## 2018-01-14 NOTE — ED Triage Notes (Signed)
Pt states 1 week of abdominal cramping and swelling, states 1 irregular BM earlier in the week, 1 BM this morning she could not go much. Pt states nausea and unable to eat. Generalized pain across entire abdomen with swelling/constipation.

## 2018-01-14 NOTE — Discharge Instructions (Signed)
Lab work done in the emergency department was reassuring.  Some of her symptoms are consistent with gastritis, ulcers, worsening acid reflux. These conditions are treated similarly. Take omeprazole 40 mg daily, Pepcid 20 mg twice a day, Carafate solution 20-30 minutes before each meal and at bedtime. Avoid irritating foods including greasy/spicy/ascitic food, alcohol, aspirin, ibuprofen.   Follow-up with gastroenterologist as scheduled next week.  Return if your abdominal pain worsens, develop fevers, blood in your vomit, blood in stool, exertional chest pain or shortness of breath.

## 2018-01-14 NOTE — ED Provider Notes (Signed)
[facility] DEPARTMENT Provider Note   CSN: [id number] Arrival date & time: 01/14/18  1424     History   Chief Complaint Chief Complaint  Patient presents with  . Abdominal Pain  . Emesis    HPI Lindsay Giles is a 42 y.o. female with history of GERD is here for evaluation of epigastric abdominal discomfort described as "sore", crampy that radiates down to area below her ribs for the last week. Associated with decreased appetite, feeling full sooner,nausea, abdominal bloating and burning discomfort that goes into her chest. Has also noticed changes in bowel movements, typically goes every morning but recently she is having less frequent bowel movements. She noticed a chalky white stool on Tuesday but none since. Last night after dinner her abdominal discomfort was at it's the worst and she vomited her entire meal. Has taken an over-the-counter acid reducer without relief. Zofran has improved the nausea and vomiting. States symptoms consistently worse after meals. Reports history of GERD in the past but this has improved, she is not sure if this is GERD, it is never been this severe before. She does drink 1-2 glasses of wine daily. No history of PUD, pancreatitis, gastritis, IBS that she is aware of. No cigarette use. No illicit drug use. No abdominal surgeries. She is a follow-up appointment with gastroenterology Dr. Oletta Lamas this upcoming Wednesday.  She denies fevers, exertional chest pain or shortness of breath, melena, hematochezia, hematemesis. HPI  Past Medical History:  Diagnosis Date  . Abnormal Pap smear   . Menorrhagia     Patient Active Problem List   Diagnosis Date Noted  . Arthralgia 03/10/2014  . Abdominal pain 03/10/2014  . Headache 03/10/2014  . Depression 03/10/2014  . Reflux esophagitis 03/10/2014  . Rectal pain 02/15/2014  . Menorrhagia     Past Surgical History:  Procedure Laterality Date  . DILATION AND CURETTAGE OF UTERUS     . EXAMINATION UNDER ANESTHESIA N/A 02/17/2014   Procedure: EXAM UNDER ANESTHESIA,  ULTRASOUND OF RECTUM, INCISION AND EXCISION OF THROMBOSED CLOT ;  Surgeon: Shann Medal, MD;  Location: [facility];  Service: General;  Laterality: N/A;  . TONSILLECTOMY AND ADENOIDECTOMY       OB History    Gravida  2   Para  2   Term      Preterm      AB      Living        SAB      TAB      Ectopic      Multiple      Live Births               Home Medications    Prior to Admission medications   Medication Sig Start Date End Date Taking? Authorizing Provider  ALPRAZolam Duanne Moron) 0.5 MG tablet Take 0.5 mg by mouth 2 (two) times daily as needed for anxiety. 07/31/16   [provider]  amphetamine-dextroamphetamine (ADDERALL) 20 MG tablet Take 20 mg by mouth daily as needed. fatigue 07/29/16   [provider]  desvenlafaxine (PRISTIQ) 100 MG 24 hr tablet Take 100 mg by mouth daily.    [provider]  famotidine (PEPCID) 20 MG tablet Take 1 tablet (20 mg total) by mouth 2 (two) times daily. 01/14/18   Kinnie Feil, PA-C  ibuprofen (ADVIL,MOTRIN) 400 MG tablet Take 1 tablet (400 mg total) by mouth every 6 (six) hours as needed for fever, headache, mild pain, moderate  pain or cramping. 02/19/14   Earnstine Regal, PA-C  omeprazole (PRILOSEC) 20 MG capsule Take 1 capsule (20 mg total) by mouth daily. 01/14/18   Kinnie Feil, PA-C  ondansetron (ZOFRAN) 8 MG tablet Take 8 mg by mouth daily as needed for nausea. 07/27/16   [provider]  polyethylene glycol (MIRALAX / GLYCOLAX) packet Take 17 g by mouth daily as needed (You can use this for acute constipation.). Patient not taking: Reported on 08/09/2016 02/19/14   Earnstine Regal, PA-C  promethazine (PHENERGAN) 25 MG tablet Take 1 tablet (25 mg total) by mouth every 6 (six) hours as needed for nausea or vomiting. 01/14/18   Kinnie Feil, PA-C  sucralfate (CARAFATE) 1 GM/10ML suspension Take 10  mLs (1 g total) by mouth 4 (four) times daily -  with meals and at bedtime. 01/14/18   Kinnie Feil, PA-C  temazepam (RESTORIL) 30 MG capsule Take 30 mg by mouth at bedtime as needed for sleep. 07/31/16   [provider]  topiramate (TOPAMAX) 25 MG tablet Take 1 tablet (25 mg total) by mouth 2 (two) times daily. Patient not taking: Reported on 08/09/2016 03/12/14   Tisovec, Fransico Him, MD  traMADol (ULTRAM) 50 MG tablet Take 1 tablet (50 mg total) by mouth every 6 (six) hours as needed. 08/09/16   Long, Wonda Olds, MD    Family History Family History  Problem Relation Age of Onset  . Hypothyroidism Mother   . Breast cancer Paternal Grandmother   . Cancer Paternal Grandmother 60       breast    Social History Social History   Tobacco Use  . Smoking status: Never Smoker  . Smokeless tobacco: Never Used  Substance Use Topics  . Alcohol use: Yes    Alcohol/week: 0.5 oz    Types: 1 Standard drinks or equivalent per week  . Drug use: No     Allergies   Atrovent [ipratropium]   Review of Systems Review of Systems  Gastrointestinal: Positive for abdominal pain, constipation, nausea and vomiting.  All other systems reviewed and are negative.    Physical Exam Updated Vital Signs BP 112/79   Pulse 62   Temp 98.2 F (36.8 C) (Oral)   Resp 14   Ht 5\' 6"  (1.676 m)   Wt 68 kg (150 lb)   LMP 12/30/2017   SpO2 97%   BMI 24.21 kg/m   Physical Exam  Constitutional: She is oriented to person, place, and time. She appears well-developed and well-nourished. No distress.  Non toxic  HENT:  Head: Normocephalic and atraumatic.  Nose: Nose normal.  Mouth/Throat: Oropharynx is clear and moist. No oropharyngeal exudate.  Moist mucous membranes   Eyes: Pupils are equal, round, and reactive to light. Conjunctivae and EOM are normal.  Neck: Normal range of motion. Neck supple.  Cardiovascular: Normal rate, regular rhythm, normal heart sounds and intact distal pulses.  No  murmur heard. 2+ DP and radial pulses bilaterally. No LE edema.   Pulmonary/Chest: Effort normal and breath sounds normal. No respiratory distress. She has no wheezes. She has no rales. She exhibits no tenderness.  Abdominal: Soft. Bowel sounds are normal. She exhibits no distension. There is tenderness in the epigastric area.  No G/R/R. No suprapubic or CVA tenderness.   Musculoskeletal: Normal range of motion. She exhibits no deformity.  Lymphadenopathy:    She has no cervical adenopathy.  Neurological: She is alert and oriented to person, place, and time. No sensory deficit.  Skin:  Skin is warm and dry. Capillary refill takes less than 2 seconds.  Psychiatric: She has a normal mood and affect. Her behavior is normal. Judgment and thought content normal.  Nursing note and vitals reviewed.    ED Treatments / Results  Labs (all labs ordered are listed, but only abnormal results are displayed) Labs Reviewed  COMPREHENSIVE METABOLIC PANEL - Abnormal; Notable for the following components:      Result Value   ALT 12 (*)    Total Bilirubin 0.2 (*)    All other components within normal limits  URINALYSIS, ROUTINE W REFLEX MICROSCOPIC - Abnormal; Notable for the following components:   Hgb urine dipstick SMALL (*)    Bacteria, UA RARE (*)    Squamous Epithelial / LPF 0-5 (*)    All other components within normal limits  LIPASE, BLOOD  CBC  I-STAT BETA HCG BLOOD, ED ([facility], [facility], [facility] ONLY)    EKG EKG Interpretation  Date/Time:  Saturday January 14 2018 14:49:05 EDT Ventricular Rate:  71 PR Interval:  146 QRS Duration: 70 QT Interval:  382 QTC Calculation: 415 R Axis:   74 Text Interpretation:  Normal sinus rhythm Normal ECG No old tracing to compare Confirmed by Malvin Johns [phone]) on 01/14/2018 4:12:28 PM   Radiology No results found.  Procedures Procedures (including critical care time)  Medications Ordered in ED Medications  ondansetron (ZOFRAN-ODT) disintegrating tablet 4  mg (4 mg Oral Given 01/14/18 1443)  gi cocktail (Maalox,Lidocaine,Donnatal) (30 mLs Oral Given 01/14/18 1707)  promethazine (PHENERGAN) tablet 25 mg (25 mg Oral Given 01/14/18 1707)     Initial Impression / Assessment and Plan / ED Course  I have reviewed the triage vital signs and the nursing notes.  Pertinent labs & imaging results that were available during my care of the patient were reviewed by me and considered in my medical decision making (see chart for details).    Differential diagnosis includes GERD, PUD, viral gastroenteritis, pancreatitis. Less likely ACS/MI, esophageal rupture, AAA.  Exam as above with epigastric tenderness, no peritoneal signs.  Symmetric pulses bilaterally. Abdomen without pulsatility.   Labs including CBC, CMP, lipase, urinalysis unremarkable. EKG done given description of burning discomfort from epigastrium into chest although this does sound GI and less like ACS. She is not having any exertional chest pain or shortness of breath, ACS is lower on differential and further cardiac work up not thought to be indicated today. Patient was given GI cocktail, Phenergan and tolerated fluid challenge without emesis. Symptoms responded to treatment in the ED. She has history of of daily alcohol use, her symptoms are worse with meals. We'll discharge with omeprazole, famotidine, Carafate. She has a scheduled follow-up appointment with gastroenterology in 4 days. Discussed return precautions. Final Clinical Impressions(s) / ED Diagnoses   Final diagnoses:  Epigastric abdominal pain    ED Discharge Orders        Ordered    omeprazole (PRILOSEC) 20 MG capsule  Daily     01/14/18 1732    famotidine (PEPCID) 20 MG tablet  2 times daily     01/14/18 1732    sucralfate (CARAFATE) 1 GM/10ML suspension  3 times daily with meals & bedtime     01/14/18 1732    promethazine (PHENERGAN) 25 MG tablet  Every 6 hours PRN     01/14/18 1732       Kinnie Feil,  PA-C 01/14/18 1754    Malvin Johns, MD 01/14/18 2312

## 2018-01-18 ENCOUNTER — Other Ambulatory Visit: Payer: Self-pay | Admitting: Gastroenterology

## 2018-01-18 DIAGNOSIS — R1032 Left lower quadrant pain: Secondary | ICD-10-CM

## 2018-01-19 ENCOUNTER — Ambulatory Visit
Admission: RE | Admit: 2018-01-19 | Discharge: 2018-01-19 | Disposition: A | Discharge status: Home / Self Care | Payer: 59 | Source: Ambulatory Visit | Attending: Gastroenterology | Admitting: Gastroenterology

## 2018-01-19 DIAGNOSIS — R1032 Left lower quadrant pain: Secondary | ICD-10-CM

## 2018-01-19 MED ORDER — IOPAMIDOL (ISOVUE-300) INJECTION 61%
100.0000 mL | Freq: Once | INTRAVENOUS | Status: AC | PRN
  Administered 2018-01-19: 100 mL via INTRAVENOUS

## 2018-01-25 ENCOUNTER — Other Ambulatory Visit (HOSPITAL_COMMUNITY): Payer: Self-pay | Admitting: Obstetrics and Gynecology

## 2018-01-25 DIAGNOSIS — N83202 Unspecified ovarian cyst, left side: Secondary | ICD-10-CM

## 2018-02-06 ENCOUNTER — Encounter (HOSPITAL_COMMUNITY): Payer: Self-pay

## 2018-02-06 ENCOUNTER — Ambulatory Visit (HOSPITAL_COMMUNITY): Payer: 59

## 2018-06-12 ENCOUNTER — Ambulatory Visit (INDEPENDENT_AMBULATORY_CARE_PROVIDER_SITE_OTHER): Payer: 59 | Admitting: Psychology

## 2018-06-12 DIAGNOSIS — F411 Generalized anxiety disorder: Secondary | ICD-10-CM

## 2018-06-28 ENCOUNTER — Ambulatory Visit (INDEPENDENT_AMBULATORY_CARE_PROVIDER_SITE_OTHER): Payer: 59 | Admitting: Psychology

## 2018-06-28 DIAGNOSIS — F411 Generalized anxiety disorder: Secondary | ICD-10-CM | POA: Diagnosis not present

## 2018-07-20 ENCOUNTER — Ambulatory Visit (INDEPENDENT_AMBULATORY_CARE_PROVIDER_SITE_OTHER): Payer: 59 | Admitting: Psychology

## 2018-07-20 DIAGNOSIS — F411 Generalized anxiety disorder: Secondary | ICD-10-CM | POA: Diagnosis not present

## 2018-08-01 ENCOUNTER — Ambulatory Visit: Payer: 59 | Admitting: Psychology

## 2018-08-04 ENCOUNTER — Ambulatory Visit: Payer: 59 | Admitting: Psychology

## 2018-08-07 ENCOUNTER — Ambulatory Visit (INDEPENDENT_AMBULATORY_CARE_PROVIDER_SITE_OTHER): Payer: 59 | Admitting: Psychology

## 2018-08-07 DIAGNOSIS — F411 Generalized anxiety disorder: Secondary | ICD-10-CM

## 2018-08-16 ENCOUNTER — Ambulatory Visit (INDEPENDENT_AMBULATORY_CARE_PROVIDER_SITE_OTHER): Payer: 59 | Admitting: Psychology

## 2018-08-16 DIAGNOSIS — F411 Generalized anxiety disorder: Secondary | ICD-10-CM

## 2018-08-30 ENCOUNTER — Ambulatory Visit (INDEPENDENT_AMBULATORY_CARE_PROVIDER_SITE_OTHER): Payer: 59 | Admitting: Psychology

## 2018-08-30 DIAGNOSIS — F411 Generalized anxiety disorder: Secondary | ICD-10-CM | POA: Diagnosis not present

## 2018-09-06 ENCOUNTER — Ambulatory Visit (INDEPENDENT_AMBULATORY_CARE_PROVIDER_SITE_OTHER): Payer: 59 | Admitting: Psychology

## 2018-09-06 DIAGNOSIS — F411 Generalized anxiety disorder: Secondary | ICD-10-CM

## 2018-09-13 ENCOUNTER — Ambulatory Visit (INDEPENDENT_AMBULATORY_CARE_PROVIDER_SITE_OTHER): Payer: 59 | Admitting: Psychology

## 2018-09-13 DIAGNOSIS — F411 Generalized anxiety disorder: Secondary | ICD-10-CM | POA: Diagnosis not present

## 2018-09-27 ENCOUNTER — Ambulatory Visit (INDEPENDENT_AMBULATORY_CARE_PROVIDER_SITE_OTHER): Payer: 59 | Admitting: Psychology

## 2018-09-27 DIAGNOSIS — F411 Generalized anxiety disorder: Secondary | ICD-10-CM

## 2018-09-29 ENCOUNTER — Ambulatory Visit (INDEPENDENT_AMBULATORY_CARE_PROVIDER_SITE_OTHER): Payer: 59 | Admitting: Psychology

## 2018-09-29 DIAGNOSIS — F411 Generalized anxiety disorder: Secondary | ICD-10-CM

## 2018-10-04 ENCOUNTER — Ambulatory Visit (INDEPENDENT_AMBULATORY_CARE_PROVIDER_SITE_OTHER): Payer: 59 | Admitting: Psychology

## 2018-10-04 DIAGNOSIS — F411 Generalized anxiety disorder: Secondary | ICD-10-CM

## 2018-10-11 ENCOUNTER — Ambulatory Visit (INDEPENDENT_AMBULATORY_CARE_PROVIDER_SITE_OTHER): Payer: 59 | Admitting: Psychology

## 2018-10-11 DIAGNOSIS — F411 Generalized anxiety disorder: Secondary | ICD-10-CM

## 2018-10-16 ENCOUNTER — Ambulatory Visit: Payer: 59 | Admitting: Psychology

## 2018-10-23 ENCOUNTER — Ambulatory Visit: Payer: Self-pay | Admitting: Psychology

## 2018-10-31 ENCOUNTER — Ambulatory Visit (INDEPENDENT_AMBULATORY_CARE_PROVIDER_SITE_OTHER): Payer: 59 | Admitting: Psychology

## 2018-10-31 DIAGNOSIS — F411 Generalized anxiety disorder: Secondary | ICD-10-CM | POA: Diagnosis not present

## 2018-11-08 ENCOUNTER — Ambulatory Visit (INDEPENDENT_AMBULATORY_CARE_PROVIDER_SITE_OTHER): Payer: 59 | Admitting: Psychology

## 2018-11-08 DIAGNOSIS — F411 Generalized anxiety disorder: Secondary | ICD-10-CM | POA: Diagnosis not present

## 2018-11-21 ENCOUNTER — Ambulatory Visit (INDEPENDENT_AMBULATORY_CARE_PROVIDER_SITE_OTHER): Payer: 59 | Admitting: Psychology

## 2018-11-21 DIAGNOSIS — F411 Generalized anxiety disorder: Secondary | ICD-10-CM

## 2018-11-28 ENCOUNTER — Ambulatory Visit (INDEPENDENT_AMBULATORY_CARE_PROVIDER_SITE_OTHER): Payer: 59 | Admitting: Psychology

## 2018-11-28 DIAGNOSIS — F411 Generalized anxiety disorder: Secondary | ICD-10-CM | POA: Diagnosis not present

## 2018-12-05 ENCOUNTER — Ambulatory Visit (INDEPENDENT_AMBULATORY_CARE_PROVIDER_SITE_OTHER): Payer: 59 | Admitting: Psychology

## 2018-12-05 DIAGNOSIS — F411 Generalized anxiety disorder: Secondary | ICD-10-CM | POA: Diagnosis not present

## 2018-12-19 ENCOUNTER — Ambulatory Visit (INDEPENDENT_AMBULATORY_CARE_PROVIDER_SITE_OTHER): Payer: 59 | Admitting: Psychology

## 2018-12-19 DIAGNOSIS — F411 Generalized anxiety disorder: Secondary | ICD-10-CM

## 2019-09-19 ENCOUNTER — Other Ambulatory Visit: Payer: Self-pay

## 2019-09-19 DIAGNOSIS — I83893 Varicose veins of bilateral lower extremities with other complications: Secondary | ICD-10-CM

## 2019-09-24 ENCOUNTER — Ambulatory Visit (INDEPENDENT_AMBULATORY_CARE_PROVIDER_SITE_OTHER): Payer: 59 | Admitting: Surgery

## 2019-09-24 ENCOUNTER — Other Ambulatory Visit: Payer: Self-pay

## 2019-09-24 ENCOUNTER — Ambulatory Visit (HOSPITAL_COMMUNITY)
Admission: RE | Admit: 2019-09-24 | Discharge: 2019-09-24 | Disposition: A | Discharge status: Home / Self Care | Payer: 59 | Source: Ambulatory Visit | Attending: Surgery | Admitting: Surgery

## 2019-09-24 ENCOUNTER — Encounter: Payer: Self-pay | Admitting: Surgery

## 2019-09-24 VITALS — BP 120/85 | HR 99 | Temp 98.0°F | Resp 18 | Ht 66.0 in | Wt 159.0 lb

## 2019-09-24 DIAGNOSIS — I83893 Varicose veins of bilateral lower extremities with other complications: Secondary | ICD-10-CM

## 2019-09-24 NOTE — Progress Notes (Signed)
[facility]  Patient name: Lindsay Giles MRN: [id number] DOB: [birth year] Sex: female   REQUESTING PROVIDER:    Self   REASON FOR CONSULT:    Spider veins  HISTORY OF PRESENT ILLNESS:   Lindsay Giles is a 43 y.o. female, who is here today because she has been having some leg pain for the past 6 months and is worried that it may be coming from her spider veins.  She states that she gets pain along the lateral side of her right calf.  This can happen anytime of the day.  She will get some throbbing at the end of the day when she is on her feet for long periods of time.  She does note a spider veins on her legs and is concerned that this may be the source.  She does not have any leg swelling. PAST MEDICAL HISTORY    Past Medical History:  Diagnosis Date  . Abnormal Pap smear   . Menorrhagia      FAMILY HISTORY   Family History  Problem Relation Age of Onset  . Hypothyroidism Mother   . Breast cancer Paternal Grandmother   . Cancer Paternal Grandmother 63       breast    SOCIAL HISTORY:   Social History   Socioeconomic History  . Marital status: Married    Spouse name: Not on file  . Number of children: Not on file  . Years of education: Not on file  . Highest education level: Not on file  Occupational History  . Not on file  Social Needs  . Financial resource strain: Not on file  . Food insecurity    Worry: Not on file    Inability: Not on file  . Transportation needs    Medical: Not on file    Non-medical: Not on file  Tobacco Use  . Smoking status: Never Smoker  . Smokeless tobacco: Never Used  Substance and Sexual Activity  . Alcohol use: Yes    Alcohol/week: 1.0 standard drinks    Types: 1 Standard drinks or equivalent per week  . Drug use: No  . Sexual activity: Yes    Partners: Male    Birth control/protection: Surgical    Comment: vas   Lifestyle  . Physical activity   Days per week: Not on file    Minutes per session: Not on file  . Stress: Not on file  Relationships  . Social Herbalist on phone: Not on file    Gets together: Not on file    Attends religious service: Not on file    Active member of club or organization: Not on file    Attends meetings of clubs or organizations: Not on file    Relationship status: Not on file  . Intimate partner violence    Fear of current or ex partner: Not on file    Emotionally abused: Not on file    Physically abused: Not on file    Forced sexual activity: Not on file  Other Topics Concern  . Not on file  Social History Narrative   Daughter of Dr. 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 Atrovent [Ipratropium] Other (See Comments)    Uncontrollable nosebleeds     CURRENT MEDICATIONS:    Current Outpatient Medications  Medication Sig Dispense Refill  . ALPRAZolam Duanne Moron)  0.5 MG tablet Take 0.5 mg by mouth 2 (two) times daily as needed for anxiety.  1  . amphetamine-dextroamphetamine (ADDERALL) 20 MG tablet Take 20 mg by mouth daily as needed. fatigue  0  . desvenlafaxine (PRISTIQ) 100 MG 24 hr tablet Take 100 mg by mouth daily.    . famotidine (PEPCID) 20 MG tablet Take 1 tablet (20 mg total) by mouth 2 (two) times daily. 30 tablet 0  . ibuprofen (ADVIL,MOTRIN) 400 MG tablet Take 1 tablet (400 mg total) by mouth every 6 (six) hours as needed for fever, headache, mild pain, moderate pain or cramping. 30 tablet 0  . omeprazole (PRILOSEC) 20 MG capsule Take 1 capsule (20 mg total) by mouth daily. 40 capsule 7  . ondansetron (ZOFRAN) 8 MG tablet Take 8 mg by mouth daily as needed for nausea.  0  . polyethylene glycol (MIRALAX / GLYCOLAX) packet Take 17 g by mouth daily as needed (You can use this for acute constipation.). (Patient not taking: Reported on 08/09/2016) 14 each 0  . promethazine  (PHENERGAN) 25 MG tablet Take 1 tablet (25 mg total) by mouth every 6 (six) hours as needed for nausea or vomiting. 30 tablet 0  . sucralfate (CARAFATE) 1 GM/10ML suspension Take 10 mLs (1 g total) by mouth 4 (four) times daily -  with meals and at bedtime. 420 mL 0  . temazepam (RESTORIL) 30 MG capsule Take 30 mg by mouth at bedtime as needed for sleep.  1  . topiramate (TOPAMAX) 25 MG tablet Take 1 tablet (25 mg total) by mouth 2 (two) times daily. (Patient not taking: Reported on 08/09/2016) 60 tablet 2  . traMADol (ULTRAM) 50 MG tablet Take 1 tablet (50 mg total) by mouth every 6 (six) hours as needed. 15 tablet 0   No current facility-administered medications for this visit.     REVIEW OF SYSTEMS:   [X]  denotes positive finding, [ ]  denotes negative finding Cardiac  Comments:  Chest pain or chest pressure:    Shortness of breath upon exertion:    Short of breath when lying flat:    Irregular heart rhythm:        Vascular    Pain in calf, thigh, or hip brought on by ambulation:    Pain in feet at night that wakes you up from your sleep:     Blood clot in your veins:    Leg swelling:         Pulmonary    Oxygen at home:    Productive cough:     Wheezing:         Neurologic    Sudden weakness in arms or legs:     Sudden numbness in arms or legs:     Sudden onset of difficulty speaking or slurred speech:    Temporary loss of vision in one eye:     Problems with dizziness:         Gastrointestinal    Blood in stool:      Vomited blood:         Genitourinary    Burning when urinating:     Blood in urine:        Psychiatric    Major depression:         Hematologic    Bleeding problems:    Problems with blood clotting too easily:        Skin    Rashes or ulcers:  Constitutional    Fever or chills:     PHYSICAL EXAM:   There were no vitals filed for this visit.  GENERAL: The patient is a well-nourished female, in no acute distress. The vital signs are  documented above. CARDIAC: There is a regular rate and rhythm.  VASCULAR: No significant lower extremity edema.  Palpable pedal pulses.  Several small spider veins on the upper thigh PULMONARY: Nonlabored respirations MUSCULOSKELETAL: There are no major deformities or cyanosis. NEUROLOGIC: No focal weakness or paresthesias are detected. SKIN: There are no ulcers or rashes noted. PSYCHIATRIC: The patient has a normal affect.  STUDIES:   I have reviewed the following venous reflux study: Right: Evidence of chronic venous insufficiency is detected in the great saphenous vein, and deep venous system. There is no evidence of deep vein thrombosis in the lower extremity. There is no evidence of superficial venous thrombosis. No cystic  structure found in the popliteal fossa. Left: Evidence of chronic venous insufficiency is detected in the great saphenous vein. There is no evidence of deep vein thrombosis in the lower extremity. There is no evidence of superficial venous thrombosis. No cystic structure found in the popliteal  fossa.   Venous Reflux Times +--------------+------+-----------+------------+--------+ RIGHT         RefluxReflux TimeDiameter cmsComments                Yes                                  +--------------+------+-----------+------------+--------+ CFV            yes   >1 second                      +--------------+------+-----------+------------+--------+ GSV at SFJ     yes    >500 ms     0.594             +--------------+------+-----------+------------+--------+ GSV prox thigh yes    >500 ms     0.440             +--------------+------+-----------+------------+--------+ GSV mid thigh                     0.477             +--------------+------+-----------+------------+--------+ GSV dist thigh                    0.382             +--------------+------+-----------+------------+--------+ GSV at knee                        0.382             +--------------+------+-----------+------------+--------+ GSV prox calf                     0.297             +--------------+------+-----------+------------+--------+ GSV mid calf                      0.219             +--------------+------+-----------+------------+--------+ SSV Pop Fossa                     0.155             +--------------+------+-----------+------------+--------+ SSV prox calf  0.201             +--------------+------+-----------+------------+--------+ SSV mid calf                      0.233             +--------------+------+-----------+------------+--------+    +--------------+------+-----------+------------+--------+ LEFT          RefluxReflux TimeDiameter cmsComments                Yes                                  +--------------+------+-----------+------------+--------+ GSV at SFJ                        0.650             +--------------+------+-----------+------------+--------+ GSV prox thigh yes    >500 ms     0.511             +--------------+------+-----------+------------+--------+ GSV mid thigh  yes    >500 ms     0.626             +--------------+------+-----------+------------+--------+ GSV dist thigh                    0.517             +--------------+------+-----------+------------+--------+ GSV at knee    yes    >500 ms     0.541             +--------------+------+-----------+------------+--------+ GSV prox calf                     0.444             +--------------+------+-----------+------------+--------+ GSV mid calf                      0.356             +--------------+------+-----------+------------+--------+ SSV Pop Fossa                     0.210             +--------------+------+-----------+------------+--------+ SSV prox calf                     0.201              +--------------+------+-----------+------------+--------+ SSV mid calf                      0.256             +--------------+------+-----------+------------+--------+    ASSESSMENT and PLAN   Spider veins: I do not feel that her spider veins are the source of her leg discomfort.  She does have mild venous reflux bilaterally.  At this time, given her lack of edema and her current symptomatology, I would not recommend venous intervention.  I think she may benefit from compression stocks particularly on the days where she is on her feet for prolonged periods of time.  She is going to try compression socks.  She will contact me if she has any further questions or concerns.   Leia Alf, MD, FACS [facility] [phone] Pager [phone]

## 2019-11-26 ENCOUNTER — Ambulatory Visit: Payer: 59 | Attending: Internal Medicine

## 2019-11-26 DIAGNOSIS — Z20822 Contact with and (suspected) exposure to covid-19: Secondary | ICD-10-CM

## 2019-11-27 ENCOUNTER — Other Ambulatory Visit: Payer: Self-pay | Admitting: Chiropractic Medicine

## 2019-11-27 DIAGNOSIS — M5136 Other intervertebral disc degeneration, lumbar region: Secondary | ICD-10-CM

## 2019-11-27 LAB — NOVEL CORONAVIRUS, NAA: SARS-CoV-2, NAA: NOT DETECTED

## 2020-03-13 ENCOUNTER — Ambulatory Visit (INDEPENDENT_AMBULATORY_CARE_PROVIDER_SITE_OTHER): Payer: 59 | Admitting: Psychology

## 2020-03-13 DIAGNOSIS — F411 Generalized anxiety disorder: Secondary | ICD-10-CM | POA: Diagnosis not present

## 2020-03-13 DIAGNOSIS — Z630 Problems in relationship with spouse or partner: Secondary | ICD-10-CM | POA: Diagnosis not present

## 2020-04-23 ENCOUNTER — Ambulatory Visit (INDEPENDENT_AMBULATORY_CARE_PROVIDER_SITE_OTHER): Payer: 59 | Admitting: Psychology

## 2020-04-23 DIAGNOSIS — F330 Major depressive disorder, recurrent, mild: Secondary | ICD-10-CM

## 2020-07-10 ENCOUNTER — Other Ambulatory Visit: Payer: Self-pay

## 2020-07-10 ENCOUNTER — Encounter (HOSPITAL_BASED_OUTPATIENT_CLINIC_OR_DEPARTMENT_OTHER): Payer: Self-pay | Admitting: *Deleted

## 2020-07-10 ENCOUNTER — Observation Stay (HOSPITAL_BASED_OUTPATIENT_CLINIC_OR_DEPARTMENT_OTHER)
Admission: EM | Admit: 2020-07-10 | Discharge: 2020-07-12 | Disposition: A | Discharge status: Home / Self Care | Payer: 59 | Attending: Family Medicine | Admitting: Family Medicine

## 2020-07-10 ENCOUNTER — Emergency Department (HOSPITAL_BASED_OUTPATIENT_CLINIC_OR_DEPARTMENT_OTHER): Payer: 59

## 2020-07-10 DIAGNOSIS — R519 Headache, unspecified: Secondary | ICD-10-CM | POA: Insufficient documentation

## 2020-07-10 DIAGNOSIS — E7800 Pure hypercholesterolemia, unspecified: Secondary | ICD-10-CM

## 2020-07-10 DIAGNOSIS — Z20822 Contact with and (suspected) exposure to covid-19: Secondary | ICD-10-CM | POA: Insufficient documentation

## 2020-07-10 DIAGNOSIS — F329 Major depressive disorder, single episode, unspecified: Secondary | ICD-10-CM | POA: Diagnosis not present

## 2020-07-10 DIAGNOSIS — R079 Chest pain, unspecified: Principal | ICD-10-CM | POA: Insufficient documentation

## 2020-07-10 DIAGNOSIS — F419 Anxiety disorder, unspecified: Secondary | ICD-10-CM | POA: Diagnosis not present

## 2020-07-10 DIAGNOSIS — Z79899 Other long term (current) drug therapy: Secondary | ICD-10-CM | POA: Diagnosis not present

## 2020-07-10 DIAGNOSIS — F909 Attention-deficit hyperactivity disorder, unspecified type: Secondary | ICD-10-CM | POA: Diagnosis not present

## 2020-07-10 DIAGNOSIS — K2100 Gastro-esophageal reflux disease with esophagitis, without bleeding: Secondary | ICD-10-CM | POA: Diagnosis present

## 2020-07-10 DIAGNOSIS — K219 Gastro-esophageal reflux disease without esophagitis: Secondary | ICD-10-CM | POA: Insufficient documentation

## 2020-07-10 DIAGNOSIS — F32A Depression, unspecified: Secondary | ICD-10-CM | POA: Diagnosis present

## 2020-07-10 LAB — CBC
HCT: 40.8 % (ref 36.0–46.0)
Hemoglobin: 14.2 g/dL (ref 12.0–15.0)
MCH: 30.9 pg (ref 26.0–34.0)
MCHC: 34.8 g/dL (ref 30.0–36.0)
MCV: 88.7 fL (ref 80.0–100.0)
Platelets: 297 10*3/uL (ref 150–400)
RBC: 4.6 MIL/uL (ref 3.87–5.11)
RDW: 12.8 % (ref 11.5–15.5)
WBC: 10.4 10*3/uL (ref 4.0–10.5)
nRBC: 0 % (ref 0.0–0.2)

## 2020-07-10 LAB — BASIC METABOLIC PANEL
Anion gap: 12 (ref 5–15)
BUN: 9 mg/dL (ref 6–20)
CO2: 21 mmol/L — ABNORMAL LOW (ref 22–32)
Calcium: 9.1 mg/dL (ref 8.9–10.3)
Chloride: 102 mmol/L (ref 98–111)
Creatinine, Ser: 0.62 mg/dL (ref 0.44–1.00)
GFR calc Af Amer: >60 mL/min (ref >60)
GFR calc non Af Amer: >60 mL/min (ref >60)
Glucose, Bld: 91 mg/dL (ref 70–99)
Potassium: 3.1 mmol/L — ABNORMAL LOW (ref 3.5–5.1)
Sodium: 135 mmol/L (ref 135–145)

## 2020-07-10 LAB — PREGNANCY, URINE: Preg Test, Ur: NEGATIVE

## 2020-07-10 LAB — HEPATIC FUNCTION PANEL
ALT: 15 U/L (ref 0–44)
AST: 21 U/L (ref 15–41)
Albumin: 4.6 g/dL (ref 3.5–5.0)
Alkaline Phosphatase: 47 U/L (ref 38–126)
Bilirubin, Direct: 0.1 mg/dL (ref 0.0–0.2)
Indirect Bilirubin: 0.6 mg/dL (ref 0.3–0.9)
Total Bilirubin: 0.7 mg/dL (ref 0.3–1.2)
Total Protein: 7.8 g/dL (ref 6.5–8.1)

## 2020-07-10 LAB — TROPONIN I (HIGH SENSITIVITY)
Troponin I (High Sensitivity): 16 ng/L (ref <18)
Troponin I (High Sensitivity): 20 ng/L — ABNORMAL HIGH (ref <18)
Troponin I (High Sensitivity): 21 ng/L — ABNORMAL HIGH (ref <18)

## 2020-07-10 LAB — LIPASE, BLOOD: Lipase: 23 U/L (ref 11–51)

## 2020-07-10 MED ORDER — ASPIRIN 81 MG PO CHEW
324.0000 mg | CHEWABLE_TABLET | Freq: Once | ORAL | Status: AC
  Administered 2020-07-10: 324 mg via ORAL
  Filled 2020-07-10: qty 4

## 2020-07-10 MED ORDER — KETOROLAC TROMETHAMINE 30 MG/ML IJ SOLN
30.0000 mg | Freq: Once | INTRAMUSCULAR | Status: AC
  Administered 2020-07-10: 30 mg via INTRAVENOUS
  Filled 2020-07-10: qty 1

## 2020-07-10 MED ORDER — FENTANYL CITRATE (PF) 100 MCG/2ML IJ SOLN
50.0000 ug | Freq: Once | INTRAMUSCULAR | Status: AC
  Administered 2020-07-10: 50 ug via INTRAVENOUS
  Filled 2020-07-10: qty 2

## 2020-07-10 NOTE — ED Triage Notes (Signed)
Sitting in the office and had sudden onset of pain in the center of her chest. She is crying and appears anxious. Nauseated. Pain in her right neck and jaw. Hx of anxiety.

## 2020-07-10 NOTE — ED Provider Notes (Signed)
[facility] Provider Note  CSN: [id number] Arrival date & time: 07/10/20 1715    History Chief Complaint  Patient presents with  . Chest Pain    HPI  Lindsay Giles is a 44 y.o. female with no significant PMH reports she was sitting at home in her office earlier today when she had sudden onset of moderate to severe midsternal squeezing/pressure pain that felt like 'a bubble'. She went to lie down and noted her HR to be elevated up to 130-150 on home pulse oximeter. She then reports she began having some aching pain in R shoulder/neck and head. Not associated with any SOB. No prior history of same. No history of HTN, DM, HLD, does not smoke. No family history of premature MI. She is feeling better now.    Past Medical History:  Diagnosis Date  . Abnormal Pap smear   . Menorrhagia     Past Surgical History:  Procedure Laterality Date  . DILATION AND CURETTAGE OF UTERUS    . EXAMINATION UNDER ANESTHESIA N/A 02/17/2014   Procedure: EXAM UNDER ANESTHESIA,  ULTRASOUND OF RECTUM, INCISION AND EXCISION OF THROMBOSED CLOT ;  Surgeon: Shann Medal, MD;  Location: [facility];  Service: General;  Laterality: N/A;  . TONSILLECTOMY AND ADENOIDECTOMY      Family History  Problem Relation Age of Onset  . Hypothyroidism Mother   . Breast cancer Paternal Grandmother   . Cancer Paternal Grandmother 8       breast    Social History   Tobacco Use  . Smoking status: Never Smoker  . Smokeless tobacco: Never Used  Vaping Use  . Vaping Use: Never used  Substance Use Topics  . Alcohol use: Yes    Alcohol/week: 1.0 standard drink    Types: 1 Standard drinks or equivalent per week  . Drug use: No     Home Medications Prior to Admission medications   Medication Sig Start Date End Date Taking? Authorizing Provider  amphetamine-dextroamphetamine (ADDERALL) 20 MG tablet Take 20 mg by mouth daily as needed. fatigue 07/29/16  Yes [provider]    desvenlafaxine (PRISTIQ) 100 MG 24 hr tablet Take 100 mg by mouth daily.   Yes [provider]  ibuprofen (ADVIL,MOTRIN) 400 MG tablet Take 1 tablet (400 mg total) by mouth every 6 (six) hours as needed for fever, headache, mild pain, moderate pain or cramping. 02/19/14  Yes Earnstine Regal, PA-C  Lactobacillus (PROBIOTIC ACIDOPHILUS PO) Take by mouth.   Yes [provider]  mometasone (NASONEX) 50 MCG/ACT nasal spray Place into the nose. 08/15/19  Yes [provider]  omeprazole (PRILOSEC) 20 MG capsule Take 1 capsule (20 mg total) by mouth daily. 01/14/18  Yes Carmon Sails J, PA-C  ondansetron (ZOFRAN) 8 MG tablet Take 8 mg by mouth daily as needed for nausea. 07/27/16  Yes [provider]  traZODone (DESYREL) 100 MG tablet Take by mouth.   Yes [provider]     Allergies    Atrovent [ipratropium]   Review of Systems   Review of Systems A comprehensive review of systems was completed and negative except as noted in HPI.    Physical Exam BP 133/87 (BP Location: Right Arm)   Pulse 75   Temp 98.5 F (36.9 C) (Oral)   Resp 18   Ht 5\' 6"  (1.676 m)   Wt 59 kg   LMP 07/03/2020   SpO2 99%   BMI 20.98 kg/m   Physical  Exam Vitals and nursing note reviewed.  Constitutional:      Appearance: Normal appearance.  HENT:     Head: Normocephalic and atraumatic.     Nose: Nose normal.     Mouth/Throat:     Mouth: Mucous membranes are moist.  Eyes:     Extraocular Movements: Extraocular movements intact.     Conjunctiva/sclera: Conjunctivae normal.  Cardiovascular:     Rate and Rhythm: Normal rate.  Pulmonary:     Effort: Pulmonary effort is normal.     Breath sounds: Normal breath sounds.  Abdominal:     General: Abdomen is flat.     Palpations: Abdomen is soft.     Tenderness: There is abdominal tenderness (mild epigastric/RUQ). There is no guarding.  Musculoskeletal:        General: No swelling. Normal range of motion.      Cervical back: Neck supple.  Skin:    General: Skin is warm and dry.  Neurological:     General: No focal deficit present.     Mental Status: She is alert.  Psychiatric:        Mood and Affect: Mood normal.      ED Results / Procedures / Treatments   Labs (all labs ordered are listed, but only abnormal results are displayed) Labs Reviewed  BASIC METABOLIC PANEL - Abnormal; Notable for the following components:      Result Value   Potassium 3.1 (*)    CO2 21 (*)    All other components within normal limits  TROPONIN I (HIGH SENSITIVITY) - Abnormal; Notable for the following components:   Troponin I (High Sensitivity) 20 (*)    All other components within normal limits  TROPONIN I (HIGH SENSITIVITY) - Abnormal; Notable for the following components:   Troponin I (High Sensitivity) 21 (*)    All other components within normal limits  SARS CORONAVIRUS 2 BY RT PCR (HOSPITAL ORDER, [facility])  CBC  PREGNANCY, URINE  HEPATIC FUNCTION PANEL  LIPASE, BLOOD  TROPONIN I (HIGH SENSITIVITY)    EKG EKG Interpretation  Date/Time:  Thursday July 10 2020 17:19:44 EDT Ventricular Rate:  92 PR Interval:  132 QRS Duration: 74 QT Interval:  332 QTC Calculation: 410 R Axis:   72 Text Interpretation: Normal sinus rhythm with sinus arrhythmia Normal ECG No significant change since last tracing Confirmed by Calvert Cantor [phone]) on 07/10/2020 5:26:47 PM    Radiology DG Chest 2 View  Result Date: 07/10/2020 CLINICAL DATA:  Chest pain EXAM: CHEST - 2 VIEW COMPARISON:  None. FINDINGS: The heart size and mediastinal contours are within normal limits. Both lungs are clear. The visualized skeletal structures are unremarkable. IMPRESSION: No active cardiopulmonary disease. Electronically Signed   By: Prudencio Pair M.D.   On: 07/10/2020 18:02    Procedures Procedures  Medications Ordered in the ED Medications  ketorolac (TORADOL) 30 MG/ML injection 30 mg  (30 mg Intravenous Given 07/10/20 2210)  fentaNYL (SUBLIMAZE) injection 50 mcg (50 mcg Intravenous Given 07/10/20 2321)  aspirin chewable tablet 324 mg (324 mg Oral Given 07/10/20 2331)     MDM Rules/Calculators/A&P MDM Patient with atypical chest pain, now with pain in R shoulder and mild epigastric pain concerning for biliary disease. She has a normal EKG and CXR. Initial Trop 16, delta is 20. Not currently having any chest pain. Will add LFT/Lipase to labs. Check third Trop ED Course  I have reviewed the triage vital signs and the nursing notes.  Pertinent labs & imaging results that were available during my care of the patient were reviewed by me and considered in my medical decision making (see chart for details).  Clinical Course as of Jul 10 2348  Thu Jul 10, 2020  2220 LFTs and Lipase are normal.   [CS]  2315 Third Trop remains mildly elevated. Will discuss admission with Hospitalist.    [CS]  2349 Spoke with Dr. Marlyce Huge, Hospitalist, who will admit for further evaluation.    [CS]    Clinical Course User Index [CS] Truddie Hidden, MD    Final Clinical Impression(s) / ED Diagnoses Final diagnoses:  Chest pain, unspecified type    Rx / DC Orders ED Discharge Orders    None       Truddie Hidden, MD 07/10/20 2349

## 2020-07-11 ENCOUNTER — Encounter (HOSPITAL_COMMUNITY): Payer: Self-pay | Admitting: Internal Medicine

## 2020-07-11 DIAGNOSIS — R079 Chest pain, unspecified: Secondary | ICD-10-CM

## 2020-07-11 DIAGNOSIS — Z79899 Other long term (current) drug therapy: Secondary | ICD-10-CM | POA: Diagnosis not present

## 2020-07-11 DIAGNOSIS — R519 Headache, unspecified: Secondary | ICD-10-CM | POA: Diagnosis not present

## 2020-07-11 DIAGNOSIS — K219 Gastro-esophageal reflux disease without esophagitis: Secondary | ICD-10-CM | POA: Diagnosis not present

## 2020-07-11 DIAGNOSIS — F329 Major depressive disorder, single episode, unspecified: Secondary | ICD-10-CM | POA: Diagnosis not present

## 2020-07-11 DIAGNOSIS — K2100 Gastro-esophageal reflux disease with esophagitis, without bleeding: Secondary | ICD-10-CM

## 2020-07-11 DIAGNOSIS — F419 Anxiety disorder, unspecified: Secondary | ICD-10-CM | POA: Diagnosis not present

## 2020-07-11 DIAGNOSIS — F909 Attention-deficit hyperactivity disorder, unspecified type: Secondary | ICD-10-CM

## 2020-07-11 DIAGNOSIS — Z20822 Contact with and (suspected) exposure to covid-19: Secondary | ICD-10-CM | POA: Diagnosis not present

## 2020-07-11 LAB — RAPID URINE DRUG SCREEN, HOSP PERFORMED
Amphetamines: POSITIVE — AB
Barbiturates: NOT DETECTED
Benzodiazepines: NOT DETECTED
Cocaine: NOT DETECTED
Opiates: NOT DETECTED
Tetrahydrocannabinol: NOT DETECTED

## 2020-07-11 LAB — TSH: TSH: 2.49 u[IU]/mL (ref 0.350–4.500)

## 2020-07-11 LAB — MAGNESIUM: Magnesium: 1.8 mg/dL (ref 1.7–2.4)

## 2020-07-11 LAB — SARS CORONAVIRUS 2 BY RT PCR (HOSPITAL ORDER, PERFORMED IN ~~LOC~~ HOSPITAL LAB): SARS Coronavirus 2: NEGATIVE

## 2020-07-11 LAB — SEDIMENTATION RATE: Sed Rate: 1 mm/hr (ref 0–22)

## 2020-07-11 LAB — D-DIMER, QUANTITATIVE: D-Dimer, Quant: 0.3 ug/mL-FEU (ref 0.00–0.50)

## 2020-07-11 MED ORDER — ONDANSETRON HCL 4 MG/2ML IJ SOLN: 4.0000 mg | Freq: Four times a day (QID) | INTRAMUSCULAR | Status: DC | PRN

## 2020-07-11 MED ORDER — ACETAMINOPHEN 325 MG PO TABS: 650.0000 mg | ORAL_TABLET | ORAL | Status: DC | PRN

## 2020-07-11 MED ORDER — CLONAZEPAM 0.5 MG PO TABS: 0.5000 mg | ORAL_TABLET | Freq: Two times a day (BID) | ORAL | Status: DC | PRN

## 2020-07-11 MED ORDER — TRAZODONE HCL 100 MG PO TABS
200.0000 mg | ORAL_TABLET | Freq: Every day | ORAL | Status: DC
  Administered 2020-07-11 (×2): 200 mg via ORAL
  Filled 2020-07-11: qty 2
  Filled 2020-07-11: qty 4

## 2020-07-11 MED ORDER — ENOXAPARIN SODIUM 40 MG/0.4ML ~~LOC~~ SOLN
40.0000 mg | SUBCUTANEOUS | Status: DC
  Administered 2020-07-11: 40 mg via SUBCUTANEOUS
  Filled 2020-07-11: qty 0.4

## 2020-07-11 MED ORDER — VENLAFAXINE HCL ER 75 MG PO CP24: 150.0000 mg | ORAL_CAPSULE | Freq: Every day | ORAL | Status: DC

## 2020-07-11 MED ORDER — ACETAMINOPHEN 325 MG PO TABS
650.0000 mg | ORAL_TABLET | Freq: Once | ORAL | Status: AC
  Administered 2020-07-11: 650 mg via ORAL
  Filled 2020-07-11: qty 2

## 2020-07-11 MED ORDER — PROCHLORPERAZINE EDISYLATE 10 MG/2ML IJ SOLN
10.0000 mg | Freq: Once | INTRAMUSCULAR | Status: AC
  Administered 2020-07-11: 10 mg via INTRAVENOUS
  Filled 2020-07-11: qty 2

## 2020-07-11 MED ORDER — DIPHENHYDRAMINE HCL 25 MG PO CAPS
25.0000 mg | ORAL_CAPSULE | Freq: Once | ORAL | Status: AC
  Administered 2020-07-11: 25 mg via ORAL
  Filled 2020-07-11: qty 1

## 2020-07-11 MED ORDER — CHOLECALCIFEROL 10 MCG (400 UNIT) PO TABS
400.0000 [IU] | ORAL_TABLET | Freq: Every day | ORAL | Status: DC
  Administered 2020-07-12: 400 [IU] via ORAL
  Filled 2020-07-11: qty 1

## 2020-07-11 MED ORDER — POTASSIUM CHLORIDE CRYS ER 20 MEQ PO TBCR
40.0000 meq | EXTENDED_RELEASE_TABLET | Freq: Once | ORAL | Status: AC
  Administered 2020-07-11: 40 meq via ORAL
  Filled 2020-07-11: qty 2

## 2020-07-11 MED ORDER — METOPROLOL TARTRATE 100 MG PO TABS
100.0000 mg | ORAL_TABLET | Freq: Once | ORAL | Status: AC
  Administered 2020-07-12: 100 mg via ORAL
  Filled 2020-07-11: qty 1

## 2020-07-11 MED ORDER — OMEGA-3-ACID ETHYL ESTERS 1 G PO CAPS
1.0000 g | ORAL_CAPSULE | Freq: Every day | ORAL | Status: DC
  Administered 2020-07-11 – 2020-07-12 (×2): 1 g via ORAL
  Filled 2020-07-11 (×2): qty 1

## 2020-07-11 MED ORDER — PANTOPRAZOLE SODIUM 40 MG PO TBEC
40.0000 mg | DELAYED_RELEASE_TABLET | Freq: Every day | ORAL | Status: DC
  Administered 2020-07-11 – 2020-07-12 (×2): 40 mg via ORAL
  Filled 2020-07-11 (×2): qty 1

## 2020-07-11 NOTE — Progress Notes (Signed)
Pt arrived to unit.  Pt is alert and oriented and in room air. Pt denies chest pain at this time. Called CCMD to make aware of pt arrival to room.   Call bell within reach.

## 2020-07-11 NOTE — Consult Note (Signed)
Cardiology Consultation:   Patient ID: Lindsay Giles Giles MRN: [id number]; DOB: Apr 05, 1976  Admit date: 07/10/2020 Date of Consult: 07/11/2020  Primary Care Provider: Patient, No Pcp Per [facility] Cardiologist: No primary care provider on file.  New Lindsay Giles Giles [facility] Electrophysiologist:  None    Patient Profile:   Lindsay Giles Giles is a 44 y.o. female who is being seen today for the evaluation of chest pain at the request of Dr. Cordelia Poche.  History of Present Illness:   Lindsay Giles Giles 44 year old female with chest discomfort.  She was sitting in the home office earlier yesterday and had sudden onset chest pain centralized, mid chest.  Crying, anxious.  Got nauseous, sweaty.  Had pain in her right neck and jaw.    Overall felt like a bubble.  Went to lay down her heart rate was elevated up to the 130s.  Then began to have some aching in her right shoulder and neck.  She has not felt this way before.  Has not had any fevers chills syncope bleeding.  No prior history of diabetes hypertension hyperlipidemia.  States that she may occasionally smoke.  No early family history of CAD.  Feels better currently.  Troponin value was mildly elevated at 20 and 21.  Chest x-ray personally reviewed showed no acute changes.  Sitting here with her husband.   Past Medical History:  Diagnosis Date  . Abnormal Pap smear   . Menorrhagia     Past Surgical History:  Procedure Laterality Date  . DILATION AND CURETTAGE OF UTERUS    . EXAMINATION UNDER ANESTHESIA N/A 02/17/2014   Procedure: EXAM UNDER ANESTHESIA,  ULTRASOUND OF RECTUM, INCISION AND EXCISION OF THROMBOSED CLOT ;  Surgeon: Shann Medal, MD;  Location: [facility];  Service: General;  Laterality: N/A;  . TONSILLECTOMY AND ADENOIDECTOMY       Home Medications:  Prior to Admission medications   Medication Sig Start Date End Date Taking? Authorizing Provider  acetaminophen (TYLENOL) 500 MG tablet Take 1,000 mg by mouth  every 6 (six) hours as needed for mild pain.   Yes [provider]  amphetamine-dextroamphetamine (ADDERALL) 20 MG tablet Take 20 mg by mouth daily as needed (fatigue).  07/29/16  Yes [provider]  clonazePAM (KLONOPIN) 0.5 MG tablet Take 0.5 mg by mouth 2 (two) times daily as needed for anxiety. 06/12/20  Yes [provider]  Cyanocobalamin (VITAMIN B-12 PO) Take 1 capsule by mouth daily.   Yes [provider]  desvenlafaxine (PRISTIQ) 100 MG 24 hr tablet Take 100 mg by mouth daily.   Yes [provider]  ibuprofen (ADVIL) 200 MG tablet Take 400 mg by mouth every 6 (six) hours as needed for moderate pain.   Yes [provider]  Lactobacillus (PROBIOTIC ACIDOPHILUS PO) Take 1 capsule by mouth daily.    Yes [provider]  mometasone (NASONEX) 50 MCG/ACT nasal spray Place 1 spray into the nose daily as needed (allergies).  08/15/19  Yes [provider]  Omega-3 Fatty Acids (FISH OIL PO) Take 1 capsule by mouth daily.   Yes [provider]  omeprazole (PRILOSEC) 20 MG capsule Take 1 capsule (20 mg total) by mouth daily. 01/14/18  Yes Carmon Sails J, PA-C  ondansetron (ZOFRAN) 8 MG tablet Take 8 mg by mouth daily as needed for nausea. 07/27/16  Yes [provider]  traZODone (DESYREL) 100 MG tablet Take 200 mg by mouth at bedtime.    Yes [provider]  VITAMIN D PO Take 1 capsule by mouth daily.   Yes [provider]  fluconazole (DIFLUCAN) 150 MG tablet Take 150 mg by mouth once. Patient not taking: Reported on 07/11/2020 07/09/20   [provider]    Inpatient Medications: Scheduled Meds: . [START ON 07/12/2020] cholecalciferol  400 Units Oral Daily  . enoxaparin (LOVENOX) injection  40 mg Subcutaneous Q24H  . [START ON 07/12/2020] metoprolol tartrate  100 mg Oral Once  . omega-3 acid ethyl esters  1 g Oral Daily  . pantoprazole  40 mg Oral Daily  . potassium chloride  40 mEq  Oral Once  . traZODone  200 mg Oral QHS   Continuous Infusions:  PRN Meds: acetaminophen, clonazePAM, ondansetron (ZOFRAN) IV  Allergies:    Allergies  Allergen Reactions  . Atrovent [Ipratropium] Other (See Comments)    Uncontrollable nosebleeds     Social History:   Social History   Socioeconomic History  . Marital status: Married    Spouse name: Not on file  . Number of children: Not on file  . Years of education: Not on file  . Highest education level: Not on file  Occupational History  . Not on file  Tobacco Use  . Smoking status: Current Some Day Smoker  . Smokeless tobacco: Never Used  . Tobacco comment: Very infrequent smoker. Difficult to quantify  Vaping Use  . Vaping Use: Never used  Substance and Sexual Activity  . Alcohol use: Yes    Alcohol/week: 7.0 standard drinks    Types: 1 Standard drinks or equivalent, 6 Shots of liquor per week  . Drug use: No  . Sexual activity: Yes    Partners: Male    Birth control/protection: Surgical    Comment: vas   Other Topics Concern  . Not on file  Social History Narrative   Daughter of Dr. 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 Difficulty of Paying Living Expenses: Not on file  Food Insecurity:   . Worried About Charity fundraiser in the Last Year: Not on file  . Ran Out of Food in the Last Year: Not on file  Transportation Needs:   . Lack of Transportation (Medical): Not on file  . Lack of Transportation (Non-Medical): Not on file  Physical Activity:   . Days of Exercise per Week: Not on file  . Minutes of Exercise per Session: Not on file  Stress:   . Feeling of Stress : Not on file  Social Connections:   . Frequency of Communication with Friends and Family: Not on file  . Frequency of Social Gatherings with Friends and Family: Not on file  . Attends Religious Services: Not on file  . Active  Member of Clubs or Organizations: Not on file  . Attends Archivist Meetings: Not on file  . Marital Status: Not on file  Intimate Partner Violence:   . Fear of Current or Ex-Partner: Not on file  . Emotionally Abused: Not on file  . Physically Abused: Not on file  . Sexually Abused: Not on file    Family History:    Family History  Problem Relation Age of Onset  . Hypothyroidism Mother   . Breast cancer Paternal Grandmother   . Cancer Paternal Grandmother 58       breast  . Arrhythmia Paternal Grandmother   . Hyperlipidemia Paternal Grandmother   .  Angina Paternal Grandmother   . Stroke Paternal Grandmother      ROS:  Please see the history of present illness.   All other ROS reviewed and negative.     Physical Exam/Data:   Vitals:   07/11/20 1414 07/11/20 1500 07/11/20 1616 07/11/20 1621  BP: (!) 92/53 109/69  110/81  Pulse: 65 77  64  Resp: 16 15  17   Temp:    98.4 F (36.9 C)  TempSrc:    Oral  SpO2: 100% 97%  98%  Weight:   57.7 kg   Height:   5\' 6"  (1.676 m)    No intake or output data in the 24 hours ending 07/11/20 1843 Last 3 Weights 07/11/2020 07/10/2020 09/24/2019  Weight (lbs) 127 lb 3.3 oz 130 lb 159 lb  Weight (kg) 57.7 kg 58.968 kg 72.122 kg     Body mass index is 20.53 kg/m.  General:  Well nourished, well developed, in no acute distress HEENT: normal Lymph: no adenopathy Neck: no JVD Endocrine:  No thryomegaly Vascular: No carotid bruits; FA pulses 2+ bilaterally without bruits  Cardiac:  normal S1, S2; RRR; no murmur  Lungs:  clear to auscultation bilaterally, no wheezing, rhonchi or rales  Abd: soft, nontender, no hepatomegaly  Ext: no edema Musculoskeletal:  No deformities, BUE and BLE strength normal and equal Skin: warm and dry  Neuro:  CNs 2-12 intact, no focal abnormalities noted Psych:  Normal affect   EKG:  The EKG was personally reviewed and demonstrates: Sinus rhythm 64 no ischemic changes.  Telemetry:  Telemetry  was personally reviewed and demonstrates: No adverse arrhythmias  Relevant CV Studies: None  Laboratory Data:  High Sensitivity Troponin:   Recent Labs  Lab 07/10/20 1727 07/10/20 2007 07/10/20 2216  TROPONINIHS 16 20* 21*     Chemistry Recent Labs  Lab 07/10/20 1727  NA 135  K 3.1*  CL 102  CO2 21*  GLUCOSE 91  BUN 9  CREATININE 0.62  CALCIUM 9.1  GFRNONAA >60  GFRAA >60  ANIONGAP 12    Recent Labs  Lab 07/10/20 1727  PROT 7.8  ALBUMIN 4.6  AST 21  ALT 15  ALKPHOS 47  BILITOT 0.7   Hematology Recent Labs  Lab 07/10/20 1727  WBC 10.4  RBC 4.60  HGB 14.2  HCT 40.8  MCV 88.7  MCH 30.9  MCHC 34.8  RDW 12.8  PLT 297   BNPNo results for input(s): BNP, PROBNP in the last 168 hours.  DDimer  Recent Labs  Lab 07/11/20 0040  DDIMER 0.30     Radiology/Studies:  DG Chest 2 View  Result Date: 07/10/2020 CLINICAL DATA:  Chest pain EXAM: CHEST - 2 VIEW COMPARISON:  None. FINDINGS: The heart size and mediastinal contours are within normal limits. Both lungs are clear. The visualized skeletal structures are unremarkable. IMPRESSION: No active cardiopulmonary disease. Electronically Signed   By: Prudencio Pair M.D.   On: 07/10/2020 18:02       HEAR Score (for undifferentiated chest pain):  HEAR Score: 1       Assessment and Plan:   Chest discomfort -Fairly atypical symptoms however troponin is minimally abnormal.  Occasional smoker/rare. -We will go ahead and proceed with coronary CT tomorrow morning.  Vin Bhagat PA has placed orders and called radiology to make sure that this could be done tomorrow morning.  I will read the scan.  We will give her 100 mg of metoprolol p.o. 2 hours prior to study. -Place  18-gauge antecubital right IV. -Sed rate is 1.  D-dimer is 0.3.  Pregnancy test is negative.  TSH is normal.   We will follow along.  For questions or updates, please contact [facility] Please consult [email or web address] for contact info under    Signed, Candee Furbish, MD  07/11/2020 6:43 PM

## 2020-07-11 NOTE — H&P (Signed)
History and Physical    Lindsay Giles DGU:[id number] DOB: Dec 11, 1975 DOA: 07/10/2020  PCP: Patient, No Pcp Per Patient coming from: Home  Chief Complaint: Chest pain  HPI: Lindsay Giles is a 44 y.o. female with medical history significant of depression, anxiety. Chest pain occurred spontaneously while oding paperwork and she started feeling fullness substernally followed by sharp pain almost like a bubble from drinking soda. She reports associated dyspnea. When she got up to move to her bedroom, she developed palpitations and measured her heart rate at 135 on pulse oximeter she had at home. She started to develop headache/sweaty palms/feet and neck pain. She called her friend who recommended she go to the ED for evaluation. Soon after, she developed significant weakness followed by a repeat pulse in 155 range. Pain at that time was starting to subside. Currently, she has intermittent chest fullness but no where near as significant as initial episode. She took some Tylenol which did not help with her pain. With regard to her headache, she reports mostly right temporal with frontal headache. She takes Adderall 20 mg 1/2 tab in the morning and 1/2 tab at noon.  ED Course: Vitals: Afebrile, pulse 64, respirations 17, BP 110/81, on room air Labs: potassium of 3.1, troponin of 16>20>21, d-dimer of 0.3, TSH of 2.49 Imaging: Chest x-ray unremarkable Medications/Course: Fentanyl, Toradol, Benadryl, Compazine  Review of Systems: Review of Systems  Constitutional: Negative for chills and fever.  Respiratory: Positive for shortness of breath (with chest pain episode). Negative for cough and wheezing.   Cardiovascular: Positive for chest pain and palpitations. Negative for orthopnea and leg swelling.  Gastrointestinal: Positive for constipation, nausea and vomiting (dry heave). Negative for abdominal pain and diarrhea.  Neurological: Positive for dizziness, weakness and headaches.  All other  systems reviewed and are negative.   Past Medical History:  Diagnosis Date  . Abnormal Pap smear   . Menorrhagia     Past Surgical History:  Procedure Laterality Date  . DILATION AND CURETTAGE OF UTERUS    . EXAMINATION UNDER ANESTHESIA N/A 02/17/2014   Procedure: EXAM UNDER ANESTHESIA,  ULTRASOUND OF RECTUM, INCISION AND EXCISION OF THROMBOSED CLOT ;  Surgeon: Shann Medal, MD;  Location: [facility];  Service: General;  Laterality: N/A;  . TONSILLECTOMY AND ADENOIDECTOMY       reports that she has been smoking. She has never used smokeless tobacco. She reports current alcohol use of about 7.0 standard drinks of alcohol per week. She reports that she does not use drugs.  Allergies  Allergen Reactions  . Atrovent [Ipratropium] Other (See Comments)    Uncontrollable nosebleeds     Family History  Problem Relation Age of Onset  . Hypothyroidism Mother   . Breast cancer Paternal Grandmother   . Cancer Paternal Grandmother 34       breast  . Arrhythmia Paternal Grandmother   . Hyperlipidemia Paternal Grandmother   . Angina Paternal Grandmother   . Stroke Paternal Grandmother    Prior to Admission medications   Medication Sig Start Date End Date Taking? Authorizing Provider  acetaminophen (TYLENOL) 500 MG tablet Take 1,000 mg by mouth every 6 (six) hours as needed for mild pain.   Yes [provider]  amphetamine-dextroamphetamine (ADDERALL) 20 MG tablet Take 20 mg by mouth daily as needed (fatigue).  07/29/16  Yes [provider]  clonazePAM (KLONOPIN) 0.5 MG tablet Take 0.5 mg by mouth 2 (two) times daily as needed for anxiety. 06/12/20  Yes [provider]  Cyanocobalamin (VITAMIN B-12 PO) Take 1 capsule by mouth daily.   Yes [provider]  desvenlafaxine (PRISTIQ) 100 MG 24 hr tablet Take 100 mg by mouth daily.   Yes [provider]  ibuprofen (ADVIL) 200 MG tablet Take 400 mg by mouth every 6 (six) hours as needed for moderate pain.    Yes [provider]  Lactobacillus (PROBIOTIC ACIDOPHILUS PO) Take 1 capsule by mouth daily.    Yes [provider]  mometasone (NASONEX) 50 MCG/ACT nasal spray Place 1 spray into the nose daily as needed (allergies).  08/15/19  Yes [provider]  Omega-3 Fatty Acids (FISH OIL PO) Take 1 capsule by mouth daily.   Yes [provider]  omeprazole (PRILOSEC) 20 MG capsule Take 1 capsule (20 mg total) by mouth daily. 01/14/18  Yes Carmon Sails J, PA-C  ondansetron (ZOFRAN) 8 MG tablet Take 8 mg by mouth daily as needed for nausea. 07/27/16  Yes [provider]  traZODone (DESYREL) 100 MG tablet Take 200 mg by mouth at bedtime.    Yes [provider]  VITAMIN D PO Take 1 capsule by mouth daily.   Yes [provider]  fluconazole (DIFLUCAN) 150 MG tablet Take 150 mg by mouth once. Patient not taking: Reported on 07/11/2020 07/09/20   [provider]    Physical Exam:  Physical Exam Vitals reviewed.  Constitutional:      General: She is not in acute distress.    Appearance: She is well-developed. She is not diaphoretic.  Eyes:     Conjunctiva/sclera: Conjunctivae normal.     Pupils: Pupils are equal, round, and reactive to light.  Cardiovascular:     Rate and Rhythm: Normal rate and regular rhythm.     Heart sounds: Normal heart sounds. No murmur heard.   Pulmonary:     Effort: Pulmonary effort is normal. No respiratory distress.     Breath sounds: Normal breath sounds. No wheezing or rales.  Abdominal:     General: Bowel sounds are normal. There is no distension.     Palpations: Abdomen is soft.     Tenderness: There is no abdominal tenderness. There is no guarding or rebound.  Musculoskeletal:        General: No tenderness. Normal range of motion.     Cervical back: Normal range of motion.     Right lower leg: No tenderness. No edema.     Left lower leg: No tenderness. No edema.  Lymphadenopathy:      Cervical: No cervical adenopathy.  Skin:    General: Skin is warm and dry.  Neurological:     General: No focal deficit present.     Mental Status: She is alert and oriented to person, place, and time.     Labs on Admission: I have personally reviewed following labs and imaging studies  CBC: Recent Labs  Lab 07/10/20 1727  WBC 10.4  HGB 14.2  HCT 40.8  MCV 88.7  PLT 382    Basic Metabolic Panel: Recent Labs  Lab 07/10/20 1727  NA 135  K 3.1*  CL 102  CO2 21*  GLUCOSE 91  BUN 9  CREATININE 0.62  CALCIUM 9.1    GFR: Estimated Creatinine Clearance: 83.4 mL/min (by C-G formula based on SCr of 0.62 mg/dL).  Liver Function Tests: Recent Labs  Lab 07/10/20 1727  AST 21  ALT 15  ALKPHOS 47  BILITOT 0.7  PROT 7.8  ALBUMIN 4.6  Recent Labs  Lab 07/10/20 1727  LIPASE 23   No results for input(s): AMMONIA in the last 168 hours.  Coagulation Profile: No results for input(s): INR, PROTIME in the last 168 hours.  Cardiac Enzymes: No results for input(s): CKTOTAL, CKMB, CKMBINDEX, TROPONINI in the last 168 hours.  BNP (last 3 results) No results for input(s): PROBNP in the last 8760 hours.  HbA1C: No results for input(s): HGBA1C in the last 72 hours.  CBG: No results for input(s): GLUCAP in the last 168 hours.  Lipid Profile: No results for input(s): CHOL, HDL, LDLCALC, TRIG, CHOLHDL, LDLDIRECT in the last 72 hours.  Thyroid Function Tests: Recent Labs    07/11/20 0040  TSH 2.490    Anemia Panel: No results for input(s): VITAMINB12, FOLATE, FERRITIN, TIBC, IRON, RETICCTPCT in the last 72 hours.  Urine analysis:    Component Value Date/Time   COLORURINE YELLOW 01/14/2018 [place] 01/14/2018 1526   LABSPEC 1.016 01/14/2018 1526   PHURINE 5.0 01/14/2018 1526   GLUCOSEU NEGATIVE 01/14/2018 1526   HGBUR SMALL (A) 01/14/2018 1526   BILIRUBINUR NEGATIVE 01/14/2018 [place] 01/14/2018 1526   PROTEINUR NEGATIVE  01/14/2018 1526   UROBILINOGEN 0.2 03/10/2014 1756   NITRITE NEGATIVE 01/14/2018 1526   LEUKOCYTESUR NEGATIVE 01/14/2018 1526     Radiological Exams on Admission: DG Chest 2 View  Result Date: 07/10/2020 CLINICAL DATA:  Chest pain EXAM: CHEST - 2 VIEW COMPARISON:  None. FINDINGS: The heart size and mediastinal contours are within normal limits. Both lungs are clear. The visualized skeletal structures are unremarkable. IMPRESSION: No active cardiopulmonary disease. Electronically Signed   By: Prudencio Pair M.D.   On: 07/10/2020 18:02    EKG: Independently reviewed from 9/16. Sinus rhythm  Assessment/Plan Active Problems:   Depression   Reflux esophagitis   Chest pain   ADHD   Chest pain Atypical. High sensitivity troponin flat. Chest pain resolved. Patient is on Adderall for which she has taken for 5 years. No change in medications. Associated palpitations and possible SVT with episode. Low HEART score -Transthoracic Echocardiogram ordered and pending -Repeat EKG -Cardiology consult -Telemetry  GERD -Continue Protonix (Prilosec as an outpatient)  Headache Patient has a history but seems more pronounced since this episode of chest pain. Also has not eaten nor has had fluids for almost 24 hours. No IV fluids given while NPO. -Diet/PO fluids -Tylenol prn  Depression Anxiety On Pristiq as an outpatient which is not on formulary. Also on Klonopin -Continue Klonopin prn  ADHD Prescribed Adderall. Hold inpatient.   DVT prophylaxis: Lovenox Code Status: Full code Family Communication: None at bedside Disposition Plan: Discharge home likely in 1 day pending cardiac workup unless abnormal test Consults called: Cardiology [facility]) Admission status: Observation   Cordelia Poche, MD [facility] 07/11/2020, 6:22 PM

## 2020-07-11 NOTE — Progress Notes (Signed)
EKG completed and placed in pt's chart.

## 2020-07-12 ENCOUNTER — Encounter (HOSPITAL_COMMUNITY): Payer: Self-pay | Admitting: Internal Medicine

## 2020-07-12 ENCOUNTER — Observation Stay (HOSPITAL_COMMUNITY): Payer: 59

## 2020-07-12 ENCOUNTER — Observation Stay (HOSPITAL_BASED_OUTPATIENT_CLINIC_OR_DEPARTMENT_OTHER): Payer: 59

## 2020-07-12 DIAGNOSIS — R079 Chest pain, unspecified: Secondary | ICD-10-CM

## 2020-07-12 DIAGNOSIS — R0789 Other chest pain: Secondary | ICD-10-CM

## 2020-07-12 DIAGNOSIS — E7800 Pure hypercholesterolemia, unspecified: Secondary | ICD-10-CM

## 2020-07-12 LAB — ECHOCARDIOGRAM COMPLETE
AR max vel: 2.26 cm2
AV Area VTI: 1.87 cm2
AV Area mean vel: 1.93 cm2
AV Mean grad: 4 mmHg
AV Peak grad: 6.7 mmHg
Ao pk vel: 1.29 m/s
Area-P 1/2: 3.42 cm2
Height: 66 in
S' Lateral: 3.1 cm
Weight: 2051.2 oz

## 2020-07-12 LAB — HIV ANTIBODY (ROUTINE TESTING W REFLEX): HIV Screen 4th Generation wRfx: NONREACTIVE

## 2020-07-12 LAB — LIPID PANEL
Cholesterol: 206 mg/dL — ABNORMAL HIGH (ref 0–200)
HDL: 43 mg/dL (ref >40)
LDL Cholesterol: 146 mg/dL — ABNORMAL HIGH (ref 0–99)
Total CHOL/HDL Ratio: 4.8 RATIO
Triglycerides: 85 mg/dL (ref <150)
VLDL: 17 mg/dL (ref 0–40)

## 2020-07-12 LAB — HEMOGLOBIN A1C
Hgb A1c MFr Bld: 4.9 % (ref 4.8–5.6)
Mean Plasma Glucose: 93.93 mg/dL

## 2020-07-12 MED ORDER — SODIUM CHLORIDE 0.9 % IV BOLUS
500.0000 mL | Freq: Once | INTRAVENOUS | Status: AC
  Administered 2020-07-12: 500 mL via INTRAVENOUS

## 2020-07-12 MED ORDER — NITROGLYCERIN 0.4 MG SL SUBL
0.8000 mg | SUBLINGUAL_TABLET | Freq: Once | SUBLINGUAL | Status: AC
  Administered 2020-07-12: 0.8 mg via SUBLINGUAL

## 2020-07-12 MED ORDER — IOHEXOL 350 MG/ML SOLN
80.0000 mL | Freq: Once | INTRAVENOUS | Status: AC | PRN
  Administered 2020-07-12: 80 mL via INTRAVENOUS

## 2020-07-12 MED ORDER — NITROGLYCERIN 0.4 MG SL SUBL
SUBLINGUAL_TABLET | SUBLINGUAL | Status: AC
  Filled 2020-07-12: qty 2

## 2020-07-12 NOTE — Progress Notes (Signed)
Discharge and medication education given. Spouse at bedside. Pt's belongings with pt: cell phone, clothes and shoes.  Pt was transported in wheelchair to main entrance.

## 2020-07-12 NOTE — Progress Notes (Signed)
  Echocardiogram 2D Echocardiogram has been performed.  Lindsay Giles 07/12/2020, 9:36 AM

## 2020-07-12 NOTE — Discharge Instructions (Signed)
Lindsay Giles,  You were in the hospital because of chest pain. Your heart workup was negative for a heart reason for your chest pain and your chest pain has resolved. It is difficult to pinpoint the cause, but it is possible it could have been related to your GI system or even anxiety. Your heart and lungs appear normal. Please follow-up with your primary care physician.

## 2020-07-12 NOTE — Progress Notes (Signed)
Progress Note  Patient Name: Lindsay Giles Date of Encounter: 07/12/2020  [facility] Cardiologist: Candee Furbish, MD   Subjective   Overnight no further chest pain.  No shortness of breath.  She did have periods on telemetry where her heart rate did gradually increase to 140.  Did not remember having any anxiety.  One episode occurred at around 11 PM.  I personally reviewed telemetry and it does not appear to be atrial tachycardia given its gradual nature.  It did slow down fairly rapidly but it was not abrupt.  Inpatient Medications    Scheduled Meds:  cholecalciferol  400 Units Oral Daily   enoxaparin (LOVENOX) injection  40 mg Subcutaneous Q24H   metoprolol tartrate  100 mg Oral Once   omega-3 acid ethyl esters  1 g Oral Daily   pantoprazole  40 mg Oral Daily   traZODone  200 mg Oral QHS   Continuous Infusions:  PRN Meds: acetaminophen, clonazePAM, ondansetron (ZOFRAN) IV   Vital Signs    Vitals:   07/11/20 1621 07/11/20 2122 07/12/20 0030 07/12/20 0340  BP: 110/81 (!) 95/59  100/72  Pulse: 64 71  69  Resp: 17 16  15   Temp: 98.4 F (36.9 C) 98.7 F (37.1 C)  98.5 F (36.9 C)  TempSrc: Oral Oral  Oral  SpO2: 98% 96%  99%  Weight:   58.2 kg   Height:        Intake/Output Summary (Last 24 hours) at 07/12/2020 0824 Last data filed at 07/11/2020 1852 Gross per 24 hour  Intake 220 ml  Output --  Net 220 ml   Last 3 Weights 07/12/2020 07/11/2020 07/10/2020  Weight (lbs) 128 lb 3.2 oz 127 lb 3.3 oz 130 lb  Weight (kg) 58.151 kg 57.7 kg 58.968 kg      Telemetry    No adverse arrhythmias, occasional episodes of sinus tachycardia up to 140.- Personally Reviewed  ECG    No new- Personally Reviewed  Physical Exam   GEN: No acute distress.   Neck: No JVD Cardiac: RRR, no murmurs, rubs, or gallops.  Respiratory: Clear to auscultation bilaterally. GI: Soft, nontender, non-distended  MS: No edema; No deformity. Neuro:  Nonfocal  Psych: Normal affect    Labs    High Sensitivity Troponin:   Recent Labs  Lab 07/10/20 1727 07/10/20 2007 07/10/20 2216  TROPONINIHS 16 20* 21*      Chemistry Recent Labs  Lab 07/10/20 1727  NA 135  K 3.1*  CL 102  CO2 21*  GLUCOSE 91  BUN 9  CREATININE 0.62  CALCIUM 9.1  PROT 7.8  ALBUMIN 4.6  AST 21  ALT 15  ALKPHOS 47  BILITOT 0.7  GFRNONAA >60  GFRAA >60  ANIONGAP 12     Hematology Recent Labs  Lab 07/10/20 1727  WBC 10.4  RBC 4.60  HGB 14.2  HCT 40.8  MCV 88.7  MCH 30.9  MCHC 34.8  RDW 12.8  PLT 297    BNPNo results for input(s): BNP, PROBNP in the last 168 hours.   DDimer  Recent Labs  Lab 07/11/20 0040  DDIMER 0.30     Radiology    DG Chest 2 View  Result Date: 07/10/2020 CLINICAL DATA:  Chest pain EXAM: CHEST - 2 VIEW COMPARISON:  None. FINDINGS: The heart size and mediastinal contours are within normal limits. Both lungs are clear. The visualized skeletal structures are unremarkable. IMPRESSION: No active cardiopulmonary disease. Electronically Signed   By: Kerby Moors  [place]u M.D.   On: 07/10/2020 18:02    Cardiac Studies   Awaiting cardiac CT and echocardiogram  Patient Profile     44 y.o. female with chest pain, anxiety, elevated heart rate surrounding chest pain  Assessment & Plan    Chest pain -Occasional smoker.  Checking coronary CT scan this morning.  Metoprolol 100 mg prior. -Checking echocardiogram. -Sed rate normal.  TSH normal. -I will give her a 500 mL bolus of fluid since she is getting nitroglycerin.  Blood pressures have been 100-1 10.  Should be able to tolerate well.     For questions or updates, please contact [place] Please consult [email or web address] for contact info under        Signed, Candee Furbish, MD  07/12/2020, 8:24 AM

## 2020-07-12 NOTE — Discharge Summary (Signed)
Physician Discharge Summary  Lindsay Giles IOX:[id number] DOB: 1976-06-01 DOA: 07/10/2020  PCP: Patient, No Pcp Per  Admit date: 07/10/2020 Discharge date: 07/12/2020  Admitted From: Home Disposition: Home  Recommendations for Outpatient Follow-up:  1. Follow up with PCP in 1 week 2. Please follow up on the following pending results: None  Home Health: None Equipment/Devices: None  Discharge Condition: Stable CODE STATUS: Full code Diet recommendation: Heart healthy   Brief/Interim Summary:  Chief Complaint: Chest pain  HPI: Lindsay Giles is a 44 y.o. female with medical history significant of depression, anxiety. Chest pain occurred spontaneously while oding paperwork and she started feeling fullness substernally followed by sharp pain almost like a bubble from drinking soda. She reports associated dyspnea. When she got up to move to her bedroom, she developed palpitations and measured her heart rate at 135 on pulse oximeter she had at home. She started to develop headache/sweaty palms/feet and neck pain. She called her friend who recommended she go to the ED for evaluation. Soon after, she developed significant weakness followed by a repeat pulse in 155 range. Pain at that time was starting to subside. Currently, she has intermittent chest fullness but no where near as significant as initial episode. She took some Tylenol which did not help with her pain. With regard to her headache, she reports mostly right temporal with frontal headache. She takes Adderall 20 mg 1/2 tab in the morning and 1/2 tab at noon.   Hospital course:  Chest pain Atypical. High sensitivity troponin slightly elevated and flat. Chest pain resolved. Patient is on Adderall for which she has taken for 5 years. No change in medications. HEAR score of 1. Cardiology consulted and patient underwent cardiac CT which was low risk. Transthoracic Echocardiogram was normal. LDL is elevated at 146 and  recommendations for diet modification. TSH, ESR normal. Telemetry revealed episode of sinus tachycardia up to 150 bmp which was asymptomatic; no further arrhythmias noted. Chest pain is non-specific. ACS ruled out. Possibly GI or anxiety related. PCP follow-up.  GERD Continue Prilosec  Headache Patient has a history but seems more pronounced since this episode of chest pain. Improved.  Depression Anxiety On Pristiq as an outpatient which is not on formulary. Also on Klonopin. Continue on discharge.  ADHD Prescribed Adderall. Held inpatient. Continue on discharge.  Sinus tachycardia Seen on telemetry. Evaluated by cardiology with no concern for atrial tachycardia.  Discharge Diagnoses:  Active Problems:   Depression   Reflux esophagitis   Chest pain   ADHD   Elevated LDL cholesterol level    Discharge Instructions  Discharge Instructions    Call MD for:  persistant nausea and vomiting   Complete by: As directed    Call MD for:  severe uncontrolled pain   Complete by: As directed    Diet - low sodium heart healthy   Complete by: As directed    Increase activity slowly   Complete by: As directed      Allergies as of 07/12/2020      Reactions   Atrovent [ipratropium] Other (See Comments)   Uncontrollable nosebleeds       Medication List    STOP taking these medications   fluconazole 150 MG tablet Commonly known as: DIFLUCAN     TAKE these medications   acetaminophen 500 MG tablet Commonly known as: TYLENOL Take 1,000 mg by mouth every 6 (six) hours as needed for mild pain.   amphetamine-dextroamphetamine 20 MG tablet Commonly known as:  ADDERALL Take 20 mg by mouth daily as needed (fatigue).   clonazePAM 0.5 MG tablet Commonly known as: KLONOPIN Take 0.5 mg by mouth 2 (two) times daily as needed for anxiety.   desvenlafaxine 100 MG 24 hr tablet Commonly known as: PRISTIQ Take 100 mg by mouth daily.   FISH OIL PO Take 1 capsule by mouth daily.     ibuprofen 200 MG tablet Commonly known as: ADVIL Take 400 mg by mouth every 6 (six) hours as needed for moderate pain.   mometasone 50 MCG/ACT nasal spray Commonly known as: NASONEX Place 1 spray into the nose daily as needed (allergies).   omeprazole 20 MG capsule Commonly known as: PRILOSEC Take 1 capsule (20 mg total) by mouth daily.   ondansetron 8 MG tablet Commonly known as: ZOFRAN Take 8 mg by mouth daily as needed for nausea.   PROBIOTIC ACIDOPHILUS PO Take 1 capsule by mouth daily.   traZODone 100 MG tablet Commonly known as: DESYREL Take 200 mg by mouth at bedtime.   VITAMIN B-12 PO Take 1 capsule by mouth daily.   VITAMIN D PO Take 1 capsule by mouth daily.       Allergies  Allergen Reactions  . Atrovent [Ipratropium] Other (See Comments)    Uncontrollable nosebleeds     Consultations:  Cardiology   Procedures/Studies: DG Chest 2 View  Result Date: 07/10/2020 CLINICAL DATA:  Chest pain EXAM: CHEST - 2 VIEW COMPARISON:  None. FINDINGS: The heart size and mediastinal contours are within normal limits. Both lungs are clear. The visualized skeletal structures are unremarkable. IMPRESSION: No active cardiopulmonary disease. Electronically Signed   By: Prudencio Pair M.D.   On: 07/10/2020 18:02   CT CORONARY MORPH W/CTA COR W/SCORE W/CA W/CM &/OR WO/CM  Addendum Date: 07/12/2020   ADDENDUM REPORT: 07/12/2020 12:01 EXAM: OVER-READ INTERPRETATION  CT CHEST The following report is an over-read performed by radiologist Dr. Samara Snide [facility] on 07/12/2020. This over-read does not include interpretation of cardiac or coronary anatomy or pathology. The coronary CTA interpretation by the cardiologist is attached. COMPARISON:  None. FINDINGS: Cardiovascular: Normal heart size. No significant pericardial effusion/thickening. Great vessels are normal in course and caliber. No central pulmonary emboli. Mediastinum/Nodes: Unremarkable esophagus. No  pathologically enlarged mediastinal or hilar lymph nodes. Lungs/Pleura: No pneumothorax. No pleural effusion. No acute consolidative airspace disease or lung masses. Two tiny scattered pulmonary nodules bilaterally, largest 2 mm anteriorly in the right middle lobe (series 8/image 9). Upper abdomen: No acute abnormality. Musculoskeletal: No aggressive appearing focal osseous lesions. Partially visualized bilateral breast prostheses. Mild thoracic spondylosis. IMPRESSION: Two tiny scattered pulmonary nodules bilaterally, largest 2 mm. No follow-up needed if patient is low-risk (and has no known or suspected primary neoplasm). Non-contrast chest CT can be considered in 12 months if patient is high-risk. This recommendation follows the consensus statement: Guidelines for Management of Incidental Pulmonary Nodules Detected on CT Images: From the Fleischner Society 2017; Radiology 2017; 284:228-243. Electronically Signed   By: Ilona Sorrel M.D.   On: 07/12/2020 12:01   Result Date: 07/12/2020 CLINICAL DATA:  44 year old female with chest pain EXAM: Cardiac/Coronary  CTA TECHNIQUE: The patient was scanned on a [employer]. FINDINGS: A 110 kV prospective scan was triggered in the descending thoracic aorta at 111 HU's. Axial non-contrast 3 mm slices were carried out through the heart. The data set was analyzed on a dedicated work station and scored using the [place]. Gantry rotation speed was 250  msecs and collimation was .6 mm. Beta blockade and 0.8 mg of sl NTG was given. The 3D data set was reconstructed in 5% intervals of the 67-82 % of the R-R cycle. Diastolic phases were analyzed on a dedicated work station using MPR, MIP and VRT modes. The patient received 80 cc of contrast. Aorta:  Normal size.  No calcifications.  No dissection. Aortic Valve:  Trileaflet.  No calcifications. Coronary Arteries:  Normal coronary origin.  Right dominance. RCA is a large dominant artery that gives rise to PDA and  PLA. There is no plaque. Left main is a large artery that gives rise to LAD and LCX arteries. LAD is a large vessel that has no plaque. LCX is a non-dominant artery that gives rise to two large OM branches. There is no plaque. Other findings: Normal pulmonary vein drainage into the left atrium. Normal left atrial appendage without a thrombus. Normal size of the pulmonary artery. Please see radiology report for non cardiac findings. IMPRESSION: 1. Coronary calcium score of 0. This was 0 percentile for age and sex matched control. 2. Normal coronary origin with right dominance. 3. No evidence of CAD. 4. CAD-RADS 0. No evidence of CAD (0%). Consider non-atherosclerotic causes of chest pain. Candee Furbish, MD [facility] Electronically Signed: By: Candee Furbish MD On: 07/12/2020 11:39   ECHOCARDIOGRAM COMPLETE  Result Date: 07/12/2020    ECHOCARDIOGRAM REPORT   Patient Name:   Lindsay Giles Date of Exam: 07/12/2020 Medical Rec #:  [id number]         Height:       66.0 in Accession #:    [id number]        Weight:       128.2 lb Date of Birth:  21-Jan-1976         BSA:          1.655 m Patient Age:    67 years          BP:           111/69 mmHg Patient Gender: F                 HR:           64 bpm. Exam Location:  Inpatient Procedure: 2D Echo, Cardiac Doppler and Color Doppler Indications:    Chest pain  History:        Patient has no prior history of Echocardiogram examinations.  Sonographer:    Clayton Lefort RDCS (AE) Referring Phys: [phone] Exavier Lina A [place]  1. Left ventricular ejection fraction, by estimation, is 60 to 65%. The left ventricle has normal function. The left ventricle has no regional wall motion abnormalities. Left ventricular diastolic parameters were normal.  2. Right ventricular systolic function is normal. The right ventricular size is normal. Tricuspid regurgitation signal is inadequate for assessing PA pressure.  3. The mitral valve is normal in structure. Trivial mitral valve regurgitation. No  evidence of mitral stenosis.  4. The aortic valve is normal in structure. Aortic valve regurgitation is not visualized. No aortic stenosis is present.  5. The inferior vena cava is normal in size with greater than 50% respiratory variability, suggesting right atrial pressure of 3 mmHg. FINDINGS  Left Ventricle: Left ventricular ejection fraction, by estimation, is 60 to 65%. The left ventricle has normal function. The left ventricle has no regional wall motion abnormalities. The left ventricular internal cavity size was normal in size. There is  no left ventricular hypertrophy. Left ventricular  diastolic parameters were normal. Normal left ventricular filling pressure. Right Ventricle: The right ventricular size is normal. No increase in right ventricular wall thickness. Right ventricular systolic function is normal. Tricuspid regurgitation signal is inadequate for assessing PA pressure. Left Atrium: Left atrial size was normal in size. Right Atrium: Right atrial size was normal in size. Pericardium: There is no evidence of pericardial effusion. Mitral Valve: The mitral valve is normal in structure. Trivial mitral valve regurgitation. No evidence of mitral valve stenosis. MV peak gradient, 4.8 mmHg. The mean mitral valve gradient is 2.0 mmHg. Tricuspid Valve: The tricuspid valve is normal in structure. Tricuspid valve regurgitation is trivial. No evidence of tricuspid stenosis. Aortic Valve: The aortic valve is normal in structure. Aortic valve regurgitation is not visualized. No aortic stenosis is present. Aortic valve mean gradient measures 4.0 mmHg. Aortic valve peak gradient measures 6.7 mmHg. Aortic valve area, by VTI measures 1.87 cm. Pulmonic Valve: The pulmonic valve was normal in structure. Pulmonic valve regurgitation is not visualized. No evidence of pulmonic stenosis. Aorta: The aortic root is normal in size and structure. Venous: The inferior vena cava is normal in size with greater than 50%  respiratory variability, suggesting right atrial pressure of 3 mmHg. IAS/Shunts: No atrial level shunt detected by color flow Doppler.  LEFT VENTRICLE PLAX 2D LVIDd:         4.50 cm  Diastology LVIDs:         3.10 cm  LV e' medial:    12.90 cm/s LV PW:         1.10 cm  LV E/e' medial:  8.0 LV IVS:        0.90 cm  LV e' lateral:   16.80 cm/s LVOT diam:     2.00 cm  LV E/e' lateral: 6.1 LV SV:         56 LV SV Index:   34 LVOT Area:     3.14 cm  RIGHT VENTRICLE             IVC RV Basal diam:  2.50 cm     IVC diam: 1.80 cm RV S prime:     11.60 cm/s TAPSE (M-mode): 2.4 cm LEFT ATRIUM             Index       RIGHT ATRIUM           Index LA diam:        2.10 cm 1.27 cm/m  RA Area:     11.90 cm LA Vol (A2C):   32.3 ml 19.51 ml/m RA Volume:   24.40 ml  14.74 ml/m LA Vol (A4C):   35.8 ml 21.63 ml/m LA Biplane Vol: 34.3 ml 20.72 ml/m  AORTIC VALVE AV Area (Vmax):    2.26 cm AV Area (Vmean):   1.93 cm AV Area (VTI):     1.87 cm AV Vmax:           129.00 cm/s AV Vmean:          94.500 cm/s AV VTI:            0.301 m AV Peak Grad:      6.7 mmHg AV Mean Grad:      4.0 mmHg LVOT Vmax:         92.70 cm/s LVOT Vmean:        58.200 cm/s LVOT VTI:          0.179 m LVOT/AV VTI ratio: 0.59  AORTA Ao Root  diam: 2.80 cm Ao Asc diam:  3.00 cm MITRAL VALVE MV Area (PHT): 3.42 cm     SHUNTS MV Peak grad:  4.8 mmHg     Systemic VTI:  0.18 m MV Mean grad:  2.0 mmHg     Systemic Diam: 2.00 cm MV Vmax:       1.10 m/s MV Vmean:      55.8 cm/s MV Decel Time: 222 msec MV E velocity: 103.00 cm/s MV A velocity: 29.60 cm/s MV E/A ratio:  3.48 Fransico Him MD Electronically signed by Fransico Him MD Signature Date/Time: 07/12/2020/11:02:44 AM    Final      Subjective: No recurrent chest pain.  Discharge Exam: Vitals:   07/12/20 0827 07/12/20 1149  BP: 111/69 98/69  Pulse: 91 62  Resp:  20  Temp:  98.7 F (37.1 C)  SpO2: 97% 99%   Vitals:   07/12/20 0030 07/12/20 0340 07/12/20 0827 07/12/20 1149  BP:  100/72 111/69 98/69    Pulse:  69 91 62  Resp:  15  20  Temp:  98.5 F (36.9 C)  98.7 F (37.1 C)  TempSrc:  Oral  Oral  SpO2:  99% 97% 99%  Weight: 58.2 kg     Height:        General: Pt is alert, awake, not in acute distress Cardiovascular: RRR, S1/S2 +, no rubs, no gallops Respiratory: CTA bilaterally, no wheezing, no rhonchi Abdominal: Soft, NT, ND, bowel sounds + Extremities: no edema, no cyanosis    The results of significant diagnostics from this hospitalization (including imaging, microbiology, ancillary and laboratory) are listed below for reference.     Microbiology: Recent Results (from the past 240 hour(s))  SARS Coronavirus 2 by RT PCR (hospital order, performed in [facility] hospital lab) Nasopharyngeal Nasopharyngeal Swab     Status: None   Collection Time: 07/10/20 11:35 PM   Specimen: Nasopharyngeal Swab  Result Value Ref Range Status   SARS Coronavirus 2 NEGATIVE NEGATIVE Final    Comment: (NOTE) SARS-CoV-2 target nucleic acids are NOT DETECTED.  The SARS-CoV-2 RNA is generally detectable in upper and lower respiratory specimens during the acute phase of infection. The lowest concentration of SARS-CoV-2 viral copies this assay can detect is 250 copies / mL. A negative result does not preclude SARS-CoV-2 infection and should not be used as the sole basis for treatment or other patient management decisions.  A negative result may occur with improper specimen collection / handling, submission of specimen other than nasopharyngeal swab, presence of viral mutation(s) within the areas targeted by this assay, and inadequate number of viral copies (<250 copies / mL). A negative result must be combined with clinical observations, patient history, and epidemiological information.  Fact Sheet for Patients:   StrictlyIdeas.no  Fact Sheet for Healthcare Providers: BankingDealers.co.za  This test is not yet approved or  cleared by the  [place] FDA and has been authorized for detection and/or diagnosis of SARS-CoV-2 by FDA under an Emergency Use Authorization (EUA).  This EUA will remain in effect (meaning this test can be used) for the duration of the COVID-19 declaration under Section 564(b)(1) of the Act, 21 U.S.C. section 360bbb-3(b)(1), unless the authorization is terminated or revoked sooner.  Performed at [facility], [address] [place], [place] [address]      Labs: BNP (last 3 results) No results for input(s): BNP in the last 8760 hours. Basic Metabolic Panel: Recent Labs  Lab 07/10/20 1727 07/11/20 1753  NA 135  --   K 3.1*  --   CL 102  --   CO2 21*  --   GLUCOSE 91  --   BUN 9  --   CREATININE 0.62  --   CALCIUM 9.1  --   MG  --  1.8   Liver Function Tests: Recent Labs  Lab 07/10/20 1727  AST 21  ALT 15  ALKPHOS 47  BILITOT 0.7  PROT 7.8  ALBUMIN 4.6   Recent Labs  Lab 07/10/20 1727  LIPASE 23   No results for input(s): AMMONIA in the last 168 hours. CBC: Recent Labs  Lab 07/10/20 1727  WBC 10.4  HGB 14.2  HCT 40.8  MCV 88.7  PLT 297   Cardiac Enzymes: No results for input(s): CKTOTAL, CKMB, CKMBINDEX, TROPONINI in the last 168 hours. BNP: Invalid input(s): POCBNP CBG: No results for input(s): GLUCAP in the last 168 hours. D-Dimer Recent Labs    07/11/20 0040  DDIMER 0.30   Hgb A1c Recent Labs    07/12/20 0537  HGBA1C 4.9   Lipid Profile Recent Labs    07/12/20 0537  CHOL 206*  HDL 43  LDLCALC 146*  TRIG 85  CHOLHDL 4.8   Thyroid function studies Recent Labs    07/11/20 0040  TSH 2.490   Anemia work up No results for input(s): VITAMINB12, FOLATE, FERRITIN, TIBC, IRON, RETICCTPCT in the last 72 hours. Urinalysis    Component Value Date/Time   COLORURINE YELLOW 01/14/2018 1526   APPEARANCEUR CLEAR 01/14/2018 1526   LABSPEC 1.016 01/14/2018 1526   PHURINE 5.0 01/14/2018 1526   GLUCOSEU NEGATIVE 01/14/2018 1526   HGBUR  SMALL (A) 01/14/2018 1526   BILIRUBINUR NEGATIVE 01/14/2018 1526   KETONESUR NEGATIVE 01/14/2018 1526   PROTEINUR NEGATIVE 01/14/2018 1526   UROBILINOGEN 0.2 03/10/2014 1756   NITRITE NEGATIVE 01/14/2018 1526   LEUKOCYTESUR NEGATIVE 01/14/2018 1526   Sepsis Labs Invalid input(s): PROCALCITONIN,  WBC,  LACTICIDVEN Microbiology Recent Results (from the past 240 hour(s))  SARS Coronavirus 2 by RT PCR (hospital order, performed in [place] hospital lab) Nasopharyngeal Nasopharyngeal Swab     Status: None   Collection Time: 07/10/20 11:35 PM   Specimen: Nasopharyngeal Swab  Result Value Ref Range Status   SARS Coronavirus 2 NEGATIVE NEGATIVE Final    Comment: (NOTE) SARS-CoV-2 target nucleic acids are NOT DETECTED.  The SARS-CoV-2 RNA is generally detectable in upper and lower respiratory specimens during the acute phase of infection. The lowest concentration of SARS-CoV-2 viral copies this assay can detect is 250 copies / mL. A negative result does not preclude SARS-CoV-2 infection and should not be used as the sole basis for treatment or other patient management decisions.  A negative result may occur with improper specimen collection / handling, submission of specimen other than nasopharyngeal swab, presence of viral mutation(s) within the areas targeted by this assay, and inadequate number of viral copies (<250 copies / mL). A negative result must be combined with clinical observations, patient history, and epidemiological information.  Fact Sheet for Patients:   StrictlyIdeas.no  Fact Sheet for Healthcare Providers: BankingDealers.co.za  This test is not yet approved or  cleared by the [place] FDA and has been authorized for detection and/or diagnosis of SARS-CoV-2 by FDA under an Emergency Use Authorization (EUA).  This EUA will remain in effect (meaning this test can be used) for the duration of the COVID-19  declaration under Section 564(b)(1) of the Act, 21 U.S.C. section 360bbb-3(b)(1), unless  the authorization is terminated or revoked sooner.  Performed at [facility], [address] [place], [place] [address]      SIGNED:   Cordelia Poche, MD [facility] 07/12/2020, 1:02 PM

## 2020-07-12 NOTE — Progress Notes (Addendum)
   ECHO is normal  Coronary CT is normal  Reassuring. Spoke to patient and husband about results.  Low risk so no need for follow up of pulmonary nodule 2 mm.   Candee Furbish, MD

## 2020-07-14 ENCOUNTER — Encounter: Payer: Self-pay | Admitting: Psychiatry

## 2020-07-14 ENCOUNTER — Ambulatory Visit (INDEPENDENT_AMBULATORY_CARE_PROVIDER_SITE_OTHER): Payer: 59 | Admitting: Psychiatry

## 2020-07-14 ENCOUNTER — Other Ambulatory Visit: Payer: Self-pay

## 2020-07-14 VITALS — BP 131/96 | HR 120 | Ht 66.0 in | Wt 128.0 lb

## 2020-07-14 DIAGNOSIS — F900 Attention-deficit hyperactivity disorder, predominantly inattentive type: Secondary | ICD-10-CM

## 2020-07-14 DIAGNOSIS — F411 Generalized anxiety disorder: Secondary | ICD-10-CM

## 2020-07-14 DIAGNOSIS — F3341 Major depressive disorder, recurrent, in partial remission: Secondary | ICD-10-CM | POA: Diagnosis not present

## 2020-07-14 MED ORDER — LISDEXAMFETAMINE DIMESYLATE 40 MG PO CAPS: 40.0000 mg | ORAL_CAPSULE | ORAL | 0 refills | Status: AC

## 2020-07-14 NOTE — Progress Notes (Signed)
[facility] MD/PA/NP Initial Note  07/14/2020 2:23 PM Lindsay Giles  MRN:  [id number]  Chief Complaint:  Chief Complaint    New Patient (Initial Visit)      HPI:   Referred by Dr Cheryln Manly and Shannan Harper and dad wanted her to make sure she's on right psych meds.  Has been seen [facility] about 6 years. [facility].   [facility] eval on 07/07/20 DX ADD in middle school.   Also dx with processing disorder.  She doesn't feel like she has any concerns about her treatment other than rec for XR stimulant. On Pristiq 100 mg for years for depression and anxiety. Not depressed now.  Pretty good interest and enjoyment but doesn't do much for herself bc always busy with work, kids, home. She feels Pristiq helps. High risk family re: Covid. Energy is low.  [place] stay for chest pain with no clear dx but FU scheduled with card. Family had Covid last month including pt and her husband.  Fatigue was a prominent part of it. No irritable or anger.  Typically appears calm to others. Stressful home with type 1 DM Robbie 17, Carlie 17 psych dx mild autism, depression, OCD. Hailey 42 yo OK. Sleeps with trazodone OK without it mind races.   Lives with decent amount of anxiety.  Feeling stressed.  May avoid some things like family.  History of panic about 2000 and started psych tx. 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    Josh says she's been on multiple meds and occ outbursts not many but wondering if med related.  Also alcohol was involved and want to make sure meds are not harmful.   H grew up in family that didn't take meds and he finds it scary. Last outburst April.  At sister's wedding argument at reception.  They were asked to leave.  Was being screamed at by other family members. Previous one at her Dad's wedding few years ago.  She felt people were laughing at her inappropriately and she left crying. Josh notes day to day not usually emotional  dyscontrol problems. No concerns about alcohol day to day.  No SI or suicide attempts.  Clonazepam 0.5 mg prn  Average 2 daily. 1 in AM bc anxious about what she has to do for the day bc feels overwhelmed.  No pattern to the 2nd one.    Visit Diagnosis:    ICD-10-CM   1. Attention deficit hyperactivity disorder (ADHD), predominantly inattentive type  F90.0 lisdexamfetamine (VYVANSE) 40 MG capsule  2. Generalized anxiety disorder  F41.1   3. Recurrent major depression in partial remission ([place])  F33.41     Past Psychiatric History: Has been seen [facility] about 6 years. Tried another antidepressant late spring ? Med and felt very flat.  Zoloft GI Wellbutrin Antidepressant for 20 years.  History Ritalin in middle school with [facility] No psych hosp  Past Medical History:  Past Medical History:  Diagnosis Date  . Abnormal Pap smear   . Menorrhagia     Past Surgical History:  Procedure Laterality Date  . DILATION AND CURETTAGE OF UTERUS    . EXAMINATION UNDER ANESTHESIA N/A 02/17/2014   Procedure: EXAM UNDER ANESTHESIA,  ULTRASOUND OF RECTUM, INCISION AND EXCISION OF THROMBOSED CLOT ;  Surgeon: Shann Medal, MD;  Location: [facility];  Service: General;  Laterality: N/A;  . TONSILLECTOMY AND ADENOIDECTOMY      Family Psychiatric History: kids as  noted. F history antidepressants.  Sister psych meds.   D psych hosp. No FHX suicide.  Family History:  Family History  Problem Relation Age of Onset  . Hypothyroidism Mother   . Breast cancer Paternal Grandmother   . Cancer Paternal Grandmother 76       breast  . Arrhythmia Paternal Grandmother   . Hyperlipidemia Paternal Grandmother   . Angina Paternal Grandmother   . Stroke Paternal Grandmother     Social History:  No sig alcohol usually.    No history of D& A abuse. Social History   Socioeconomic History  . Marital status: Married    Spouse name: Not on file  . Number of children: Not on file  . Years  of education: Not on file  . Highest education level: Not on file  Occupational History  . Not on file  Tobacco Use  . Smoking status: Current Some Day Smoker  . Smokeless tobacco: Never Used  . Tobacco comment: Very infrequent smoker. Difficult to quantify  Vaping Use  . Vaping Use: Never used  Substance and Sexual Activity  . Alcohol use: Yes    Alcohol/week: 7.0 standard drinks    Types: 1 Standard drinks or equivalent, 6 Shots of liquor per week  . Drug use: No  . Sexual activity: Yes    Partners: Male    Birth control/protection: Surgical    Comment: vas   Other Topics Concern  . Not on file  Social History Narrative   Daughter of Dr. 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 Difficulty of Paying Living Expenses: Not on file  Food Insecurity:   . Worried About Charity fundraiser in the Last Year: Not on file  . Ran Out of Food in the Last Year: Not on file  Transportation Needs:   . Lack of Transportation (Medical): Not on file  . Lack of Transportation (Non-Medical): Not on file  Physical Activity:   . Days of Exercise per Week: Not on file  . Minutes of Exercise per Session: Not on file  Stress:   . Feeling of Stress : Not on file  Social Connections:   . Frequency of Communication with Friends and Family: Not on file  . Frequency of Social Gatherings with Friends and Family: Not on file  . Attends Religious Services: Not on file  . Active Member of Clubs or Organizations: Not on file  . Attends Archivist Meetings: Not on file  . Marital Status: Not on file    Allergies:  Allergies  Allergen Reactions  . Atrovent [Ipratropium] Other (See Comments)    Uncontrollable nosebleeds     Metabolic Disorder Labs: Lab Results  Component Value Date   HGBA1C 4.9 07/12/2020   MPG 93.93 07/12/2020   No results found for: PROLACTIN Lab Results   Component Value Date   CHOL 206 (H) 07/12/2020   TRIG 85 07/12/2020   HDL 43 07/12/2020   CHOLHDL 4.8 07/12/2020   VLDL 17 07/12/2020   LDLCALC 146 (H) 07/12/2020   Lab Results  Component Value Date   TSH 2.490 07/11/2020    Therapeutic Level Labs: No results found for: LITHIUM No results found for: VALPROATE No components found for:  CBMZ  Current Medications: Current Outpatient Medications  Medication Sig Dispense Refill  . acetaminophen (TYLENOL) 500 MG tablet Take 1,000 mg by mouth every 6 (six)  hours as needed for mild pain.    Marland Kitchen amphetamine-dextroamphetamine (ADDERALL) 20 MG tablet Take 20 mg by mouth daily as needed (fatigue).   0  . clonazePAM (KLONOPIN) 0.5 MG tablet Take 0.5 mg by mouth 2 (two) times daily as needed for anxiety.    . Cyanocobalamin (VITAMIN B-12 PO) Take 1 capsule by mouth daily.    Marland Kitchen desvenlafaxine (PRISTIQ) 100 MG 24 hr tablet Take 100 mg by mouth daily.    Marland Kitchen ibuprofen (ADVIL) 200 MG tablet Take 400 mg by mouth every 6 (six) hours as needed for moderate pain.    . Lactobacillus (PROBIOTIC ACIDOPHILUS PO) Take 1 capsule by mouth daily.     . mometasone (NASONEX) 50 MCG/ACT nasal spray Place 1 spray into the nose daily as needed (allergies).     . Omega-3 Fatty Acids (FISH OIL PO) Take 1 capsule by mouth daily.    Marland Kitchen omeprazole (PRILOSEC) 20 MG capsule Take 1 capsule (20 mg total) by mouth daily. 40 capsule 7  . ondansetron (ZOFRAN) 8 MG tablet Take 8 mg by mouth daily as needed for nausea.  0  . traZODone (DESYREL) 100 MG tablet Take 200 mg by mouth at bedtime.     Marland Kitchen VITAMIN D PO Take 1 capsule by mouth daily.    Marland Kitchen lisdexamfetamine (VYVANSE) 40 MG capsule Take 1 capsule (40 mg total) by mouth every morning. 30 capsule 0   No current facility-administered medications for this visit.    Medication Side Effects: none  Orders placed this visit:  No orders of the defined types were placed in this encounter.   Psychiatric Specialty Exam:  Review of  Systems  Constitutional: Positive for fatigue and unexpected weight change. Negative for chills and diaphoresis.  HENT: Negative for congestion, dental problem, hearing loss, tinnitus and voice change.   Eyes: Negative for photophobia, pain and visual disturbance.  Respiratory: Negative for apnea, choking and shortness of breath.   Cardiovascular: Negative for chest pain and palpitations.  Gastrointestinal: Negative for abdominal distention, diarrhea, nausea and vomiting.  Endocrine: Negative for polyuria.  Genitourinary: Negative for difficulty urinating, frequency, hematuria, pelvic pain and urgency.  Musculoskeletal: Negative for arthralgias, back pain and neck pain.  Skin: Negative for rash.  Allergic/Immunologic: Negative for environmental allergies.  Neurological: Positive for weakness and headaches. Negative for dizziness, tremors and seizures.    Blood pressure (!) 131/96, pulse (!) 120, height 5\' 6"  (1.676 m), weight 128 lb (58.1 kg), last menstrual period 07/03/2020.Body mass index is 20.66 kg/m.  General Appearance: Casual  Eye Contact:  Good  Speech:  Normal Rate  Volume:  Normal  Mood:  Anxious  Affect:  Constricted  Thought Process:  Coherent, Goal Directed and Descriptions of Associations: Intact  Orientation:  Full (Time, Place, and Person)  Thought Content: Logical   Suicidal Thoughts:  No  Homicidal Thoughts:  No  Memory:  WNL  Judgement:  Good  Insight:  Good  Psychomotor Activity:  Decreased  Concentration:  Concentration: Good  Recall:  Good  Fund of Knowledge: Good  Language: Good  Assets:  Communication Skills Desire for Improvement Financial Resources/Insurance Housing Intimacy Leisure Time Physical Health Resilience Social Support Talents/Skills Transportation Vocational/Educational  ADL's:  Intact  Cognition: WNL  Prognosis:  Good   Screenings:  MDQ negative except for irritability increased self-confidence, talkativeness, racing thoughts  and easily distracted.  She does have a diagnosis of ADD.  Receiving Psychotherapy: Dr. Cheryln Manly on and off for 14-15 years  Treatment Plan/Recommendations:  Greater than 50% of 60 min face to face time with patient and her husband Merrily Pew was spent on counseling and coordination of care. We discussed the following: Raul was referred for a second opinion about her psychiatric medications and to ask some questions about psychiatric medications and alcohol use as well as rare emotional outbursts.  Overall her depression appears to be in remission.  She has residual general anxiety and is not totally sure of all the antidepressants that she has been on but there was an effort to change from Pristiq this year to another antidepressant that she described as having side effects of flatness.  This suggests that her mental health provider attempted to switch her to a more serotonergic antidepressant which would be appropriate given the background level of anxiety.  However she did not tolerate it and switch back to Pristiq.  Pristiq is a more noradrenergic antidepressant and would be less prone to cause flatness but often not quite as effective for anxiety as are the other SSRIs or SNRIs such as duloxetine or Effexor.  So she is using some clonazepam and low-dose in the morning in combination with a stimulant which is not ideal but it does appear that she failed alternative medical strategies to treat her anxiety without the use of clonazepam.  She was encouraged to keep the dose of clonazepam as low as possible so as not to interfere with the effectiveness of the stimulant and vice versa.  She was seen for an evaluation at [facility] and they suggested a switch from short acting Adderall 20 mg, 1/2 tablet twice daily to a longer acting agent such as Adderall XR or Vyvanse.  Given that she is a mother and has activities in the evening I suggested Vyvanse would likely have a longer duration and  better overall outcome.  She wanted to go ahead and pursue that so I wrote the prescription for Vyvanse 40 mg every morning.  If that is not effective enough then the dose could be raised to 50 mg daily.  We discussed side effects in detail. Discussed potential benefits, risks, and side effects of stimulants with patient to include increased heart rate, palpitations, insomnia, increased anxiety, increased irritability, or decreased appetite.  Instructed patient to contact office if experiencing any significant tolerability issues.  PDMP shows patient has been receiving Adderall 20 mg a day plus clonazepam 1 mg daily for the last 2 years at least.  We discussed in detail the potential effects or interactions between alcohol and each of the different medications that she is taking.  Her depression is in remission and while alcohol use is generally discouraged with antidepressants in a patient whose symptoms are in remission responsible alcohol use is generally tolerated. Alcohol use with clonazepam would not be as safe and could increase side effects of both alcohol and clonazepam.  She was aware of this and does not take the clonazepam in the evening whenever she might have social drink.  She denies alcohol abuse and her husband does not contradict this assertion.  Finally regarding the outbursts.  Both occasions mentioned were the context of a stressful family event in which all parties have been drinking alcohol.  There does not appear to be any pattern of outburst otherwise.  This does not appear to represent any psychiatric diagnosis and is unlikely to be related to medication.  Patient can follow-up here as needed but otherwise plans to return to her longstanding psychiatric provider.  She also plans to  continue counseling as needed.  In summary the only psychiatric medication change I recommended was switched from short acting Adderall to Vyvanse in order to get greater duration of effect and  smoother affect.  I concur with her continuing the low-dose clonazepam and Pristiq 100 mg daily.  Purnell Shoemaker, MD

## 2020-07-23 ENCOUNTER — Other Ambulatory Visit: Payer: Self-pay

## 2020-07-23 ENCOUNTER — Ambulatory Visit (INDEPENDENT_AMBULATORY_CARE_PROVIDER_SITE_OTHER): Payer: 59 | Admitting: Physician Assistant

## 2020-07-23 ENCOUNTER — Encounter: Payer: Self-pay | Admitting: Physician Assistant

## 2020-07-23 VITALS — BP 122/68 | HR 115 | Temp 98.5°F | Ht 65.0 in | Wt 130.8 lb

## 2020-07-23 DIAGNOSIS — R000 Tachycardia, unspecified: Secondary | ICD-10-CM

## 2020-07-23 DIAGNOSIS — E876 Hypokalemia: Secondary | ICD-10-CM

## 2020-07-23 DIAGNOSIS — K219 Gastro-esophageal reflux disease without esophagitis: Secondary | ICD-10-CM

## 2020-07-23 LAB — COMPREHENSIVE METABOLIC PANEL
ALT: 13 U/L (ref 0–35)
AST: 15 U/L (ref 0–37)
Albumin: 4.6 g/dL (ref 3.5–5.2)
Alkaline Phosphatase: 50 U/L (ref 39–117)
BUN: 6 mg/dL (ref 6–23)
CO2: 24 mEq/L (ref 19–32)
Calcium: 9.5 mg/dL (ref 8.4–10.5)
Chloride: 101 mEq/L (ref 96–112)
Creatinine, Ser: 0.53 mg/dL (ref 0.40–1.20)
GFR: 126.07 mL/min (ref >60.00)
Glucose, Bld: 90 mg/dL (ref 70–99)
Potassium: 3.4 mEq/L — ABNORMAL LOW (ref 3.5–5.1)
Sodium: 136 mEq/L (ref 135–145)
Total Bilirubin: 0.5 mg/dL (ref 0.2–1.2)
Total Protein: 7.1 g/dL (ref 6.0–8.3)

## 2020-07-23 LAB — T3, FREE: T3, Free: 3.7 pg/mL (ref 2.3–4.2)

## 2020-07-23 LAB — T4, FREE: Free T4: 0.88 ng/dL (ref 0.60–1.60)

## 2020-07-23 MED ORDER — ONDANSETRON HCL 8 MG PO TABS: 8.0000 mg | ORAL_TABLET | Freq: Every day | ORAL | 0 refills | Status: DC | PRN

## 2020-07-23 MED ORDER — PANTOPRAZOLE SODIUM 40 MG PO TBEC: 40.0000 mg | DELAYED_RELEASE_TABLET | Freq: Every day | ORAL | 3 refills | Status: DC

## 2020-07-23 NOTE — Patient Instructions (Addendum)
Please go to the lab today for blood work.  I will call you with your results. We will alter treatment regimen(s) if indicated by your results.   You need to make sure not to skip meals. Keep a well-balanced diet.  I want you to stop the omeprazole and start the Protonix daily as directed. Follow dietary recommendations below. If not improving I will want to send you to Gastroenterology.   For the palpitations, I really feel the lack of intake plus being on a stimulant is contributing to these symptoms. I am rechecking further tests of your thyroid. I am also setting you up for a Holter Study. I would recommend talking to your specialist about a non-stimulant medication for ADD, at least until we get things evaluated further.   It was very nice meeting you today. Welcome to [facility]!

## 2020-07-23 NOTE — Progress Notes (Signed)
Patient presents to clinic today to establish care. Patient was recently evaluated in ER/Hospitlized for complaints of tachycardia and atypical chest pain. Was seen in ER on 07/11/2020 . Workup at that time included normal CXR and EKG. Mild hypokalemia noted. LFT and lipase within normal limits. Delta troponin obtained and mildly elevated. Patient was subsequently admitted for further evaluation and management.  During admission, troponin levels trended and flat. Chest pain resolved while in the ER. Cardiology consulted and patient underwent Cardiac CT which was low risk. TTE normal. TSH, ESR within norma limits. Telemetry revealed sinus tachycardia up to rate of 150 bpm. This was asymptomatic. Felt chest pain was GERD related. She was encouraged to continue OTC Prilosec. She was discharged home on 07/12/2020 to follow-up with Primary Care.   Since discharge patient notes elevated heart rate with occasional sensation of palpitations. Notes some fatigue during this episodes. Denies lightheadedness or dizziness. Recently had follow-up with Psychiatry at which time her chronic Adderall was changed to Vyvanse 60m daily. Notes continued epigastric/sternal discomfort associated with significant heartburn and occasional mild nausea. This is despite taking Omeprazole and Pepcid. Denies any alcohol use since hospitalization. Is not currently smoking. Denies NSAID use. Is trying to keep well-hydrated. Denies change to bowel or bladder habits.   Past Medical History:  Diagnosis Date  . Abnormal Pap smear   . ADD (attention deficit disorder)   . Anxiety and depression   . Menorrhagia     Past Surgical History:  Procedure Laterality Date  . DILATION AND CURETTAGE OF UTERUS    . EXAMINATION UNDER ANESTHESIA N/A 02/17/2014   Procedure: EXAM UNDER ANESTHESIA,  ULTRASOUND OF RECTUM, INCISION AND EXCISION OF THROMBOSED CLOT ;  Surgeon: DShann Medal MD;  Location: [facility];  Service: General;  Laterality: N/A;  .  TONSILLECTOMY AND ADENOIDECTOMY      Current Outpatient Medications on File Prior to Visit  Medication Sig Dispense Refill  . albuterol (VENTOLIN HFA) 108 (90 Base) MCG/ACT inhaler Inhale 2 puffs into the lungs every 4 (four) hours as needed.    . clonazePAM (KLONOPIN) 0.5 MG tablet Take 0.5 mg by mouth 2 (two) times daily as needed for anxiety.    . Cyanocobalamin (VITAMIN B-12 PO) Take 1 capsule by mouth daily.    . Lactobacillus (PROBIOTIC ACIDOPHILUS PO) Take 1 capsule by mouth daily.     .Marland Kitchenlisdexamfetamine (VYVANSE) 40 MG capsule Take 1 capsule (40 mg total) by mouth every morning. 30 capsule 0  . Omega-3 Fatty Acids (FISH OIL PO) Take 1 capsule by mouth daily.    .Marland Kitchendesvenlafaxine (PRISTIQ) 100 MG 24 hr tablet Take 100 mg by mouth daily. (Patient not taking: Reported on 07/23/2020)    . mometasone (NASONEX) 50 MCG/ACT nasal spray Place 1 spray into the nose daily as needed (allergies).  (Patient not taking: Reported on 07/23/2020)    . traZODone (DESYREL) 100 MG tablet Take 200 mg by mouth at bedtime.  (Patient not taking: Reported on 07/23/2020)    . VITAMIN D PO Take 1 capsule by mouth daily.    . Vitamin D, Ergocalciferol, (DRISDOL) 1.25 MG (50000 UNIT) CAPS capsule Take 50,000 Units by mouth once a week.     No current facility-administered medications on file prior to visit.    Allergies  Allergen Reactions  . Atrovent [Ipratropium] Other (See Comments)    Uncontrollable nosebleeds     Family History  Problem Relation Age of Onset  . Hypothyroidism Mother   .  Breast cancer Paternal Grandmother   . Cancer Paternal Grandmother 10       breast  . Arrhythmia Paternal Grandmother   . Hyperlipidemia Paternal Grandmother   . Angina Paternal Grandmother   . Stroke Paternal Grandmother     Social History   Socioeconomic History  . Marital status: Married    Spouse name: Not on file  . Number of children: Not on file  . Years of education: Not on file  . Highest education  level: Not on file  Occupational History  . Not on file  Tobacco Use  . Smoking status: Former Smoker    Types: Cigarettes  . Smokeless tobacco: Never Used  Vaping Use  . Vaping Use: Never used  Substance and Sexual Activity  . Alcohol use: Not Currently    Alcohol/week: 7.0 standard drinks    Types: 6 Shots of liquor, 1 Standard drinks or equivalent per week  . Drug use: No  . Sexual activity: Yes    Partners: Male    Birth control/protection: Surgical    Comment: vas   Other Topics Concern  . Not on file  Social History Narrative   Daughter of Dr. 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 Difficulty of Paying Living Expenses: Not on file  Food Insecurity:   . Worried About Charity fundraiser in the Last Year: Not on file  . Ran Out of Food in the Last Year: Not on file  Transportation Needs:   . Lack of Transportation (Medical): Not on file  . Lack of Transportation (Non-Medical): Not on file  Physical Activity:   . Days of Exercise per Week: Not on file  . Minutes of Exercise per Session: Not on file  Stress:   . Feeling of Stress : Not on file  Social Connections:   . Frequency of Communication with Friends and Family: Not on file  . Frequency of Social Gatherings with Friends and Family: Not on file  . Attends Religious Services: Not on file  . Active Member of Clubs or Organizations: Not on file  . Attends Archivist Meetings: Not on file  . Marital Status: Not on file  Intimate Partner Violence:   . Fear of Current or Ex-Partner: Not on file  . Emotionally Abused: Not on file  . Physically Abused: Not on file  . Sexually Abused: Not on file   ROS Pertinent ROS are listed in the HPI.   BP 122/68   Pulse (!) 115   Temp 98.5 F (36.9 C) (Temporal)   Ht '5\' 5"'  (1.651 m)   Wt 130 lb 12.8 oz (59.3 kg)   LMP 07/03/2020   SpO2 98%   BMI  21.77 kg/m   Physical Exam Vitals reviewed.  Constitutional:      Appearance: Normal appearance.  HENT:     Head: Normocephalic and atraumatic.     Right Ear: Tympanic membrane normal.     Left Ear: Tympanic membrane normal.  Eyes:     Conjunctiva/sclera: Conjunctivae normal.     Pupils: Pupils are equal, round, and reactive to light.  Cardiovascular:     Rate and Rhythm: Regular rhythm. Tachycardia present.     Pulses: Normal pulses.     Heart sounds: Normal heart sounds.  Pulmonary:     Effort: Pulmonary effort is normal.     Breath sounds: Normal breath sounds.  Abdominal:     General: Bowel sounds are normal.     Palpations: Abdomen is soft.     Tenderness: There is abdominal tenderness (LUQ).  Musculoskeletal:     Cervical back: Neck supple.  Neurological:     General: No focal deficit present.     Mental Status: She is alert and oriented to person, place, and time.  Psychiatric:        Mood and Affect: Mood normal.     Recent Results (from the past 2160 hour(s))  Basic metabolic panel     Status: Abnormal   Collection Time: 07/10/20  5:27 PM  Result Value Ref Range   Sodium 135 135 - 145 mmol/L   Potassium 3.1 (L) 3.5 - 5.1 mmol/L   Chloride 102 98 - 111 mmol/L   CO2 21 (L) 22 - 32 mmol/L   Glucose, Bld 91 70 - 99 mg/dL    Comment: Glucose reference range applies only to samples taken after fasting for at least 8 hours.   BUN 9 6 - 20 mg/dL   Creatinine, Ser 0.62 0.44 - 1.00 mg/dL   Calcium 9.1 8.9 - 10.3 mg/dL   GFR calc non Af Amer >60 >60 mL/min   GFR calc Af Amer >60 >60 mL/min   Anion gap 12 5 - 15    Comment: Performed at [facility], [address] [place], [place] [address]  CBC     Status: None   Collection Time: 07/10/20  5:27 PM  Result Value Ref Range   WBC 10.4 4.0 - 10.5 K/uL   RBC 4.60 3.87 - 5.11 MIL/uL   Hemoglobin 14.2 12.0 - 15.0 g/dL   HCT 40.8 36 - 46 %   MCV 88.7 80.0 - 100.0 fL   MCH 30.9 26.0 - 34.0 pg   MCHC 34.8  30.0 - 36.0 g/dL   RDW 12.8 11.5 - 15.5 %   Platelets 297 150 - 400 K/uL   nRBC 0.0 0.0 - 0.2 %    Comment: Performed at [facility], [address] [place], [place] [address]  Troponin I (High Sensitivity)     Status: None   Collection Time: 07/10/20  5:27 PM  Result Value Ref Range   Troponin I (High Sensitivity) 16 <18 ng/L    Comment: (NOTE) Elevated high sensitivity troponin I (hsTnI) values and significant  changes across serial measurements may suggest ACS but many other  chronic and acute conditions are known to elevate hsTnI results.  Refer to the "Links" section for chest pain algorithms and additional  guidance. Performed at [facility], [address] [place], [place] [address]   Hepatic function panel     Status: None   Collection Time: 07/10/20  5:27 PM  Result Value Ref Range   Total Protein 7.8 6.5 - 8.1 g/dL   Albumin 4.6 3.5 - 5.0 g/dL   AST 21 15 - 41 U/L   ALT 15 0 - 44 U/L   Alkaline Phosphatase 47 38 - 126 U/L   Total Bilirubin 0.7 0.3 - 1.2 mg/dL   Bilirubin, Direct 0.1 0.0 - 0.2 mg/dL   Indirect Bilirubin 0.6 0.3 - 0.9 mg/dL    Comment: Performed at [facility], [address] [place], [place] [address]  Lipase, blood     Status: None   Collection Time: 07/10/20  5:27 PM  Result Value Ref Range   Lipase 23 11 -  51 U/L    Comment: Performed at [facility], [address] [place], [place] [address]  Pregnancy, urine     Status: None   Collection Time: 07/10/20  5:28 PM  Result Value Ref Range   Preg Test, Ur NEGATIVE NEGATIVE    Comment:        THE SENSITIVITY OF THIS METHODOLOGY IS >20 mIU/mL. Performed at [facility], [address] [place], [place] [address]   Urine rapid drug screen (hosp performed)     Status: Abnormal   Collection Time: 07/10/20  5:28 PM  Result Value Ref Range   Opiates NONE DETECTED NONE DETECTED   Cocaine NONE DETECTED NONE DETECTED   Benzodiazepines NONE  DETECTED NONE DETECTED   Amphetamines POSITIVE (A) NONE DETECTED   Tetrahydrocannabinol NONE DETECTED NONE DETECTED   Barbiturates NONE DETECTED NONE DETECTED    Comment: (NOTE) DRUG SCREEN FOR MEDICAL PURPOSES ONLY.  IF CONFIRMATION IS NEEDED FOR ANY PURPOSE, NOTIFY LAB WITHIN 5 DAYS.  LOWEST DETECTABLE LIMITS FOR URINE DRUG SCREEN Drug Class                     Cutoff (ng/mL) Amphetamine and metabolites    1000 Barbiturate and metabolites    200 Benzodiazepine                 408 Tricyclics and metabolites     300 Opiates and metabolites        300 Cocaine and metabolites        300 THC                            50 Performed at [facility], [address] [place], [place] [address]   Troponin I (High Sensitivity)     Status: Abnormal   Collection Time: 07/10/20  8:07 PM  Result Value Ref Range   Troponin I (High Sensitivity) 20 (H) <18 ng/L    Comment: (NOTE) Elevated high sensitivity troponin I (hsTnI) values and significant  changes across serial measurements may suggest ACS but many other  chronic and acute conditions are known to elevate hsTnI results.  Refer to the "Links" section for chest pain algorithms and additional  guidance. Performed at [facility], [address] [place], [place] [address]   Troponin I (High Sensitivity)     Status: Abnormal   Collection Time: 07/10/20 10:16 PM  Result Value Ref Range   Troponin I (High Sensitivity) 21 (H) <18 ng/L    Comment: (NOTE) Elevated high sensitivity troponin I (hsTnI) values and significant  changes across serial measurements may suggest ACS but many other  chronic and acute conditions are known to elevate hsTnI results.  Refer to the "Links" section for chest pain algorithms and additional  guidance. Performed at [facility], [address] [place], [place] [address]   SARS Coronavirus 2 by RT PCR (hospital order, performed in [facility] hospital lab) Nasopharyngeal  Nasopharyngeal Swab     Status: None   Collection Time: 07/10/20 11:35 PM   Specimen: Nasopharyngeal Swab  Result Value Ref Range   SARS Coronavirus 2 NEGATIVE NEGATIVE    Comment: (NOTE) SARS-CoV-2 target nucleic acids are NOT DETECTED.  The SARS-CoV-2 RNA is generally detectable in upper and lower respiratory specimens during the acute phase of infection. The lowest concentration of SARS-CoV-2 viral copies this assay  can detect is 250 copies / mL. A negative result does not preclude SARS-CoV-2 infection and should not be used as the sole basis for treatment or other patient management decisions.  A negative result may occur with improper specimen collection / handling, submission of specimen other than nasopharyngeal swab, presence of viral mutation(s) within the areas targeted by this assay, and inadequate number of viral copies (<250 copies / mL). A negative result must be combined with clinical observations, patient history, and epidemiological information.  Fact Sheet for Patients:   StrictlyIdeas.no  Fact Sheet for Healthcare Providers: BankingDealers.co.za  This test is not yet approved or  cleared by the [place] FDA and has been authorized for detection and/or diagnosis of SARS-CoV-2 by FDA under an Emergency Use Authorization (EUA).  This EUA will remain in effect (meaning this test can be used) for the duration of the COVID-19 declaration under Section 564(b)(1) of the Act, 21 U.S.C. section 360bbb-3(b)(1), unless the authorization is terminated or revoked sooner.  Performed at [facility], [address] [place], [place] [address]   D-dimer, quantitative (not at [facility])     Status: None   Collection Time: 07/11/20 12:40 AM  Result Value Ref Range   D-Dimer, Quant 0.30 0.00 - 0.50 ug/mL-FEU    Comment: (NOTE) At the manufacturer cut-off of 0.50 ug/mL FEU, this assay has been documented to exclude PE  with a sensitivity and negative predictive value of 97 to 99%.  At this time, this assay has not been approved by the FDA to exclude DVT/VTE. Results should be correlated with clinical presentation. Performed at [facility], [address] [place], [place] [address]   TSH     Status: None   Collection Time: 07/11/20 12:40 AM  Result Value Ref Range   TSH 2.490 0.350 - 4.500 uIU/mL    Comment: Performed by a 3rd Generation assay with a functional sensitivity of <=0.01 uIU/mL. Performed at [facility], [place] [address], [place], [place] [address]   Sedimentation rate     Status: None   Collection Time: 07/11/20 12:40 AM  Result Value Ref Range   Sed Rate 1 0 - 22 mm/hr    Comment: Performed at [facility], [address] [place], [place] [address]  Magnesium     Status: None   Collection Time: 07/11/20  5:53 PM  Result Value Ref Range   Magnesium 1.8 1.7 - 2.4 mg/dL    Comment: Performed at [facility], [place] [address] [place], [place] [address]  Lipid panel     Status: Abnormal   Collection Time: 07/12/20  5:37 AM  Result Value Ref Range   Cholesterol 206 (H) 0 - 200 mg/dL   Triglycerides 85 <150 mg/dL   HDL 43 >40 mg/dL   Total CHOL/HDL Ratio 4.8 RATIO   VLDL 17 0 - 40 mg/dL   LDL Cholesterol 146 (H) 0 - 99 mg/dL    Comment:        Total Cholesterol/HDL:CHD Risk Coronary Heart Disease Risk Table                     Men   Women  1/2 Average Risk   3.4   3.3  Average Risk       5.0   4.4  2 X Average Risk   9.6   7.1  3 X Average Risk  23.4   11.0  Use the calculated Patient Ratio above and the CHD Risk Table to determine the patient's CHD Risk.        ATP III CLASSIFICATION (LDL):  <100     mg/dL   Optimal  100-129  mg/dL   Near or Above                    Optimal  130-159  mg/dL   Borderline  160-189  mg/dL   High  >190     mg/dL   Very High Performed at [facility] [address], [place], [place] [address]     Hemoglobin A1c     Status: None   Collection Time: 07/12/20  5:37 AM  Result Value Ref Range   Hgb A1c MFr Bld 4.9 4.8 - 5.6 %    Comment: (NOTE) Pre diabetes:          5.7%-6.4%  Diabetes:              >6.4%  Glycemic control for   <7.0% adults with diabetes    Mean Plasma Glucose 93.93 mg/dL    Comment: Performed at [facility] [address], [place], [place] [address]  HIV Antibody (routine testing w rflx)     Status: None   Collection Time: 07/12/20  5:37 AM  Result Value Ref Range   HIV Screen 4th Generation wRfx Non Reactive Non Reactive    Comment: Performed at [facility], [place] [address], [place], [place] [address]  ECHOCARDIOGRAM COMPLETE     Status: None   Collection Time: 07/12/20  9:36 AM  Result Value Ref Range   Weight 2,051.2 oz   Height 66 in   BP 111/69 mmHg   S' Lateral 3.10 cm   AR max vel 2.26 cm2   AV Area VTI 1.87 cm2   AV Mean grad 4.0 mmHg   AV Peak grad 6.7 mmHg   Ao pk vel 1.29 m/s   Area-P 1/2 3.42 cm2   AV Area mean vel 1.93 cm2    Assessment/Plan: 1. Tachycardia CBC during hospitalization negative for signs of anemia. TSH within normal limits. Will add on free t3 and t4 to further assess. Will obtain Holter study. Recommend avoidance of caffeine. Recommend she speak with her psychiatrist about non-stimulant medications. Strict ER precautions reviewed with patient.  - T4, free - T3, free - Holter monitor - 48 hour; Future  2. Gastroesophageal reflux disease without esophagitis Will switch Omeprazole to Protonix 40 mg once daily. Continue Pepcid. GERD diet reviewed.Close follow-up in 2 weeks. If no major improvement will proceed with h pylori testing/GI referral.   3. Hypokalemia Mild. Noted incidentally on ER labs. Will recheck today. Proper hydration discussed with patient.  - Comprehensive metabolic panel  This visit occurred during the SARS-CoV-2 public health emergency.  Safety protocols were in place, including  screening questions prior to the visit, additional usage of staff PPE, and extensive cleaning of exam room while observing appropriate contact time as indicated for disinfecting solutions.     Leeanne Rio, PA-C

## 2020-07-24 ENCOUNTER — Ambulatory Visit: Payer: 59

## 2020-07-24 ENCOUNTER — Encounter: Payer: Self-pay | Admitting: *Deleted

## 2020-07-24 NOTE — Progress Notes (Signed)
Patient ID: Lindsay Giles, female   DOB: 05-09-1976, 44 y.o.   MRN: [id number] Patient enrolled for [employer] to ship a 3 day ZIO XT long term holter monitor to her home.

## 2020-07-30 ENCOUNTER — Other Ambulatory Visit (INDEPENDENT_AMBULATORY_CARE_PROVIDER_SITE_OTHER): Payer: 59

## 2020-07-30 DIAGNOSIS — R000 Tachycardia, unspecified: Secondary | ICD-10-CM

## 2020-08-06 ENCOUNTER — Telehealth: Payer: Self-pay

## 2020-08-06 NOTE — Telephone Encounter (Signed)
Ok to place referral to GI. If PPI not helping at current dose, ok to increase to twice daily dosing for the next week to see if this makes a bigger impact. In regards to ketones -- she is not diabetic   Lab Results  Component Value Date   HGBA1C 4.9 07/12/2020    -- so she needs to make sure she is eating and drinking well so her body is not relying on ketosis. Recommend repeat OV to further assess this. I do not have results from Cardiologist regarding heart monitor. Will notify her of this once results are available.

## 2020-08-06 NOTE — Telephone Encounter (Signed)
Patient states she is running a high heart rate.    States was running 140 yesterday, 08/05/2020. States right now it is running 105, but if she is up its running higher.    Also, still has sweaty palms and feet.  States she has diabetic strips and states she is spilling ketones in urine and is having a hard time keeping weight on.  States she is trying to gain weight but can not.    States Elyn Aquas ordered a heart test.  States she sent that back yesterday.    Also, states that she was placed on Protonix.  Patient does not feel like this is working. States Einar Pheasant had mentioned a possible referral to GI.  Would like to know if referral can be placed?   States has gone to [facility] in the past and would like to be referred there.

## 2020-08-07 NOTE — Telephone Encounter (Signed)
Spoke with patient in regards to PCP recommendations. Patient voiced understanding. Appointment was scheduled for reassessment for 08/08/2020.

## 2020-08-08 ENCOUNTER — Ambulatory Visit (INDEPENDENT_AMBULATORY_CARE_PROVIDER_SITE_OTHER): Payer: 59 | Admitting: Physician Assistant

## 2020-08-08 ENCOUNTER — Encounter: Payer: Self-pay | Admitting: Physician Assistant

## 2020-08-08 ENCOUNTER — Other Ambulatory Visit: Payer: Self-pay

## 2020-08-08 VITALS — BP 120/82 | HR 105 | Temp 98.0°F | Resp 17 | Wt 127.2 lb

## 2020-08-08 DIAGNOSIS — R000 Tachycardia, unspecified: Secondary | ICD-10-CM

## 2020-08-08 DIAGNOSIS — R3915 Urgency of urination: Secondary | ICD-10-CM | POA: Diagnosis not present

## 2020-08-08 DIAGNOSIS — E876 Hypokalemia: Secondary | ICD-10-CM | POA: Diagnosis not present

## 2020-08-08 DIAGNOSIS — K219 Gastro-esophageal reflux disease without esophagitis: Secondary | ICD-10-CM

## 2020-08-08 DIAGNOSIS — I471 Supraventricular tachycardia: Secondary | ICD-10-CM

## 2020-08-08 MED ORDER — DEXLANSOPRAZOLE 60 MG PO CPDR: 60.0000 mg | DELAYED_RELEASE_CAPSULE | Freq: Every day | ORAL | 1 refills | Status: DC

## 2020-08-08 MED ORDER — METOPROLOL SUCCINATE ER 25 MG PO TB24: 25.0000 mg | ORAL_TABLET | Freq: Every day | ORAL | 3 refills | Status: DC

## 2020-08-08 NOTE — Patient Instructions (Signed)
Stop the Protonix.  Start Dexilant once daily. Take famotidine (Pepcid) 40 mg at night. Continue diet recommended below.   I am starting you on low dose medication to help with heart rate while you are awaiting holter monitor results. Again I recommend stopping the vyvanse as it is contrbuting to current issue with heart rate. Please follow-up with your psychiatric provider to discuss.   I am working on getting you in sooner with Gastroenterology.  Please go to the lab today for blood work.  I will call you with your results. We will alter treatment regimen(s) if indicated by your results.    Food Choices for Gastroesophageal Reflux Disease, Adult When you have gastroesophageal reflux disease (GERD), the foods you eat and your eating habits are very important. Choosing the right foods can help ease your discomfort. Think about working with a nutrition specialist (dietitian) to help you make good choices. What are tips for following this plan?  Meals  Choose healthy foods that are low in fat, such as fruits, vegetables, whole grains, low-fat dairy products, and lean meat, fish, and poultry.  Eat small meals often instead of 3 large meals a day. Eat your meals slowly, and in a place where you are relaxed. Avoid bending over or lying down until 2-3 hours after eating.  Avoid eating meals 2-3 hours before bed.  Avoid drinking a lot of liquid with meals.  Cook foods using methods other than frying. Bake, grill, or broil food instead.  Avoid or limit: ? Chocolate. ? Peppermint or spearmint. ? Alcohol. ? Pepper. ? Black and decaffeinated coffee. ? Black and decaffeinated tea. ? Bubbly (carbonated) soft drinks. ? Caffeinated energy drinks and soft drinks.  Limit high-fat foods such as: ? Fatty meat or fried foods. ? Whole milk, cream, butter, or ice cream. ? Nuts and nut butters. ? Pastries, donuts, and sweets made with butter or shortening.  Avoid foods that cause symptoms.  These foods may be different for everyone. Common foods that cause symptoms include: ? Tomatoes. ? Oranges, lemons, and limes. ? Peppers. ? Spicy food. ? Onions and garlic. ? Vinegar. Lifestyle  Maintain a healthy weight. Ask your doctor what weight is healthy for you. If you need to lose weight, work with your doctor to do so safely.  Exercise for at least 30 minutes for 5 or more days each week, or as told by your doctor.  Wear loose-fitting clothes.  Do not smoke. If you need help quitting, ask your doctor.  Sleep with the head of your bed higher than your feet. Use a wedge under the mattress or blocks under the bed frame to raise the head of the bed. Summary  When you have gastroesophageal reflux disease (GERD), food and lifestyle choices are very important in easing your symptoms.  Eat small meals often instead of 3 large meals a day. Eat your meals slowly, and in a place where you are relaxed.  Limit high-fat foods such as fatty meat or fried foods.  Avoid bending over or lying down until 2-3 hours after eating.  Avoid peppermint and spearmint, caffeine, alcohol, and chocolate. This information is not intended to replace advice given to you by your health care provider. Make sure you discuss any questions you have with your health care provider. Document Revised: 02/01/2019 Document Reviewed: 11/16/2016 Elsevier Patient Education  [place].

## 2020-08-08 NOTE — Progress Notes (Signed)
Patient presents to clinic today to follow-up regarding ongoing symptoms she has been having.  Patient initially evaluated on 07/23/2020 with complaints of ongoing acid reflux and at visits of tachycardia.  At that time patient was restarted on pantoprazole and encouraged to follow a GERD diet.  Labs regarding tachycardia and fatigue were obtained and overall unremarkable except for mild hypokalemia at 3.4, improved from her recent hospitalization.   Patient was set up for a Holter monitor study which she has completed, just turning into cardiology this week.  Has not heard regarding results.  Was also recommended at last visit she speak with her behavioral health provider about switching her to a nonstimulant medication for ADHD as this was likely contributing to her symptoms.  Since last visit she notes continued issue with elevated heart rate up to 140 at home. Can be with exertion or at rest. Notes lowest her heart rate has been is in the 90s. Denies chest pain but does note chest tightness as she gets anxious when her heart rate is up. Denies lightheadedness, dizziness, syncope. Still having substantial issue with reflux despite taking the Protonix daily. Occasional epigastric discomfort without vomiting. Denies melena, hematochezia or tenesmus. Notes some mild urinary frequency. Is wanting to know if she may have lupus as a cause of her symptoms cause she heard it can cause increased heart rate. In regards to her stimulant medications she does not feel they are contributing to heart rate and has continued on Vyvanse.   Past Medical History:  Diagnosis Date  . Abnormal Pap smear   . ADD (attention deficit disorder)   . Anxiety and depression   . Menorrhagia     Current Outpatient Medications on File Prior to Visit  Medication Sig Dispense Refill  . clonazePAM (KLONOPIN) 0.5 MG tablet Take 0.5 mg by mouth 2 (two) times daily as needed for anxiety.    . Cyanocobalamin (VITAMIN B-12 PO) Take 1  capsule by mouth daily.    Marland Kitchen desvenlafaxine (PRISTIQ) 100 MG 24 hr tablet Take 100 mg by mouth daily.     . Lactobacillus (PROBIOTIC ACIDOPHILUS PO) Take 1 capsule by mouth daily.     Marland Kitchen lisdexamfetamine (VYVANSE) 40 MG capsule Take 1 capsule (40 mg total) by mouth every morning. 30 capsule 0  . naproxen sodium (ANAPROX) 550 MG tablet SMARTSIG:1 Tablet(s) By Mouth Every 12 Hours PRN    . Omega-3 Fatty Acids (FISH OIL PO) Take 1 capsule by mouth daily.    . ondansetron (ZOFRAN) 8 MG tablet Take 1 tablet (8 mg total) by mouth daily as needed for nausea. 20 tablet 0  . pantoprazole (PROTONIX) 40 MG tablet Take 1 tablet (40 mg total) by mouth daily. 30 tablet 3  . traZODone (DESYREL) 100 MG tablet Take 200 mg by mouth at bedtime.     Marland Kitchen VITAMIN D PO Take 1 capsule by mouth daily.    . Vitamin D, Ergocalciferol, (DRISDOL) 1.25 MG (50000 UNIT) CAPS capsule Take 50,000 Units by mouth once a week.    Marland Kitchen albuterol (VENTOLIN HFA) 108 (90 Base) MCG/ACT inhaler Inhale 2 puffs into the lungs every 4 (four) hours as needed. (Patient not taking: Reported on 08/08/2020)    . mometasone (NASONEX) 50 MCG/ACT nasal spray Place 1 spray into the nose daily as needed (allergies).  (Patient not taking: Reported on 07/23/2020)     No current facility-administered medications on file prior to visit.    Allergies  Allergen Reactions  . Atrovent [Ipratropium]  Other (See Comments)    Uncontrollable nosebleeds     Family History  Problem Relation Age of Onset  . Hypothyroidism Mother   . Breast cancer Paternal Grandmother   . Cancer Paternal Grandmother 62       breast  . Arrhythmia Paternal Grandmother   . Hyperlipidemia Paternal Grandmother   . Angina Paternal Grandmother   . Stroke Paternal Grandmother     Social History   Socioeconomic History  . Marital status: Married    Spouse name: Not on file  . Number of children: Not on file  . Years of education: Not on file  . Highest education level: Not on  file  Occupational History  . Not on file  Tobacco Use  . Smoking status: Former Smoker    Types: Cigarettes  . Smokeless tobacco: Never Used  Vaping Use  . Vaping Use: Never used  Substance and Sexual Activity  . Alcohol use: Not Currently    Alcohol/week: 7.0 standard drinks    Types: 6 Shots of liquor, 1 Standard drinks or equivalent per week  . Drug use: No  . Sexual activity: Yes    Partners: Male    Birth control/protection: Surgical    Comment: vas   Other Topics Concern  . Not on file  Social History Narrative   Daughter of Dr. 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 Difficulty of Paying Living Expenses: Not on file  Food Insecurity:   . Worried About Charity fundraiser in the Last Year: Not on file  . Ran Out of Food in the Last Year: Not on file  Transportation Needs:   . Lack of Transportation (Medical): Not on file  . Lack of Transportation (Non-Medical): Not on file  Physical Activity:   . Days of Exercise per Week: Not on file  . Minutes of Exercise per Session: Not on file  Stress:   . Feeling of Stress : Not on file  Social Connections:   . Frequency of Communication with Friends and Family: Not on file  . Frequency of Social Gatherings with Friends and Family: Not on file  . Attends Religious Services: Not on file  . Active Member of Clubs or Organizations: Not on file  . Attends Archivist Meetings: Not on file  . Marital Status: Not on file    Review of Systems - See HPI.  All other ROS are negative.  BP 120/82   Pulse (!) 105   Temp 98 F (36.7 C) (Temporal)   Resp 17   Wt 127 lb 3.2 oz (57.7 kg)   SpO2 98%   BMI 21.17 kg/m   Physical Exam Vitals reviewed.  Constitutional:      Appearance: Normal appearance.  HENT:     Head: Normocephalic and atraumatic.  Eyes:     Conjunctiva/sclera: Conjunctivae normal.      Pupils: Pupils are equal, round, and reactive to light.  Cardiovascular:     Rate and Rhythm: Regular rhythm. Tachycardia present.     Pulses: Normal pulses.     Heart sounds: Normal heart sounds.  Pulmonary:     Effort: Pulmonary effort is normal.     Breath sounds: Normal breath sounds.  Abdominal:     General: Bowel sounds are normal.     Palpations: Abdomen is soft.  Musculoskeletal:     Cervical back: Neck supple.  Neurological:     General: No focal deficit present.     Mental Status: She is alert and oriented to person, place, and time.  Psychiatric:        Mood and Affect: Mood normal.     Recent Results (from the past 2160 hour(s))  Basic metabolic panel     Status: Abnormal   Collection Time: 07/10/20  5:27 PM  Result Value Ref [place]   Sodium 135 135 - 145 mmol/L   Potassium 3.1 (L) 3.5 - 5.1 mmol/L   Chloride 102 98 - 111 mmol/L   CO2 21 (L) 22 - 32 mmol/L   Glucose, Bld 91 70 - 99 mg/dL    Comment: Glucose reference range applies only to samples taken after fasting for at least 8 hours.   BUN 9 6 - 20 mg/dL   Creatinine, Ser 0.62 0.44 - 1.00 mg/dL   Calcium 9.1 8.9 - 10.3 mg/dL   GFR calc non Af Amer >60 >60 mL/min   GFR calc Af Amer >60 >60 mL/min   Anion gap 12 5 - 15    Comment: Performed at [facility], [address] [place], [place] [address]  CBC     Status: None   Collection Time: 07/10/20  5:27 PM  Result Value Ref [place]   WBC 10.4 4.0 - 10.5 K/uL   RBC 4.60 3.87 - 5.11 MIL/uL   Hemoglobin 14.2 12.0 - 15.0 g/dL   HCT 40.8 36 - 46 %   MCV 88.7 80.0 - 100.0 fL   MCH 30.9 26.0 - 34.0 pg   MCHC 34.8 30.0 - 36.0 g/dL   RDW 12.8 11.5 - 15.5 %   Platelets 297 150 - 400 K/uL   nRBC 0.0 0.0 - 0.2 %    Comment: Performed at [facility], [address] [place], [place] [address]  Troponin I (High Sensitivity)     Status: None   Collection Time: 07/10/20  5:27 PM  Result Value Ref Range   Troponin I (High Sensitivity) 16 <18  ng/L    Comment: (NOTE) Elevated high sensitivity troponin I (hsTnI) values and significant  changes across serial measurements may suggest ACS but many other  chronic and acute conditions are known to elevate hsTnI results.  Refer to the "Links" section for chest pain algorithms and additional  guidance. Performed at [facility], [address] [place], [place] [address]   Hepatic function panel     Status: None   Collection Time: 07/10/20  5:27 PM  Result Value Ref [place]   Total Protein 7.8 6.5 - 8.1 g/dL   Albumin 4.6 3.5 - 5.0 g/dL   AST 21 15 - 41 U/L   ALT 15 0 - 44 U/L   Alkaline Phosphatase 47 38 - 126 U/L   Total Bilirubin 0.7 0.3 - 1.2 mg/dL   Bilirubin, Direct 0.1 0.0 - 0.2 mg/dL   Indirect Bilirubin 0.6 0.3 - 0.9 mg/dL    Comment: Performed at [facility], [address] [place], [place] [address]  Lipase, blood     Status: None   Collection Time: 07/10/20  5:27 PM  Result Value Ref Range   Lipase 23 11 - 51 U/L    Comment: Performed at [facility], [address] [place], [place] [address]  Pregnancy, urine     Status: None   Collection Time: 07/10/20  5:28 PM  Result Value Ref Range   Preg Test, Ur NEGATIVE NEGATIVE    Comment:        THE SENSITIVITY OF THIS METHODOLOGY IS >20 mIU/mL. Performed at [facility], [address] [place], [place] [address]   Urine rapid drug screen (hosp performed)     Status: Abnormal   Collection Time: 07/10/20  5:28 PM  Result Value Ref Range   Opiates NONE DETECTED NONE DETECTED   Cocaine NONE DETECTED NONE DETECTED   Benzodiazepines NONE DETECTED NONE DETECTED   Amphetamines POSITIVE (A) NONE DETECTED   Tetrahydrocannabinol NONE DETECTED NONE DETECTED   Barbiturates NONE DETECTED NONE DETECTED    Comment: (NOTE) DRUG SCREEN FOR MEDICAL PURPOSES ONLY.  IF CONFIRMATION IS NEEDED FOR ANY PURPOSE, NOTIFY LAB WITHIN 5 DAYS.  LOWEST DETECTABLE LIMITS FOR URINE DRUG  SCREEN Drug Class                     Cutoff (ng/mL) Amphetamine and metabolites    1000 Barbiturate and metabolites    200 Benzodiazepine                 185 Tricyclics and metabolites     300 Opiates and metabolites        300 Cocaine and metabolites        300 THC                            50 Performed at [facility], [address] [place], [place] [address]   Troponin I (High Sensitivity)     Status: Abnormal   Collection Time: 07/10/20  8:07 PM  Result Value Ref Range   Troponin I (High Sensitivity) 20 (H) <18 ng/L    Comment: (NOTE) Elevated high sensitivity troponin I (hsTnI) values and significant  changes across serial measurements may suggest ACS but many other  chronic and acute conditions are known to elevate hsTnI results.  Refer to the "Links" section for chest pain algorithms and additional  guidance. Performed at [facility], [address] [place], [place] [address]   Troponin I (High Sensitivity)     Status: Abnormal   Collection Time: 07/10/20 10:16 PM  Result Value Ref Range   Troponin I (High Sensitivity) 21 (H) <18 ng/L    Comment: (NOTE) Elevated high sensitivity troponin I (hsTnI) values and significant  changes across serial measurements may suggest ACS but many other  chronic and acute conditions are known to elevate hsTnI results.  Refer to the "Links" section for chest pain algorithms and additional  guidance. Performed at [facility], [address] [place], [place] [address]   SARS Coronavirus 2 by RT PCR (hospital order, performed in [facility] hospital lab) Nasopharyngeal Nasopharyngeal Swab     Status: None   Collection Time: 07/10/20 11:35 PM   Specimen: Nasopharyngeal Swab  Result Value Ref Range   SARS Coronavirus 2 NEGATIVE NEGATIVE    Comment: (NOTE) SARS-CoV-2 target nucleic acids are NOT DETECTED.  The SARS-CoV-2 RNA is generally detectable in upper and lower respiratory specimens during  the acute phase of infection. The lowest concentration of SARS-CoV-2 viral copies this assay can detect is 250 copies / mL. A negative result does not preclude SARS-CoV-2 infection and should not be used as the sole basis for treatment or other patient management decisions.  A negative result may occur with  improper specimen collection / handling, submission of specimen other than nasopharyngeal swab, presence of viral mutation(s) within the areas targeted by this assay, and inadequate number of viral copies (<250 copies / mL). A negative result must be combined with clinical observations, patient history, and epidemiological information.  Fact Sheet for Patients:   StrictlyIdeas.no  Fact Sheet for Healthcare Providers: BankingDealers.co.za  This test is not yet approved or  cleared by the [place] FDA and has been authorized for detection and/or diagnosis of SARS-CoV-2 by FDA under an Emergency Use Authorization (EUA).  This EUA will remain in effect (meaning this test can be used) for the duration of the COVID-19 declaration under Section 564(b)(1) of the Act, 21 U.S.C. section 360bbb-3(b)(1), unless the authorization is terminated or revoked sooner.  Performed at [facility], [address] [place], [place] [address]   D-dimer, quantitative (not at [facility])     Status: None   Collection Time: 07/11/20 12:40 AM  Result Value Ref Range   D-Dimer, Quant 0.30 0.00 - 0.50 ug/mL-FEU    Comment: (NOTE) At the manufacturer cut-off of 0.50 ug/mL FEU, this assay has been documented to exclude PE with a sensitivity and negative predictive value of 97 to 99%.  At this time, this assay has not been approved by the FDA to exclude DVT/VTE. Results should be correlated with clinical presentation. Performed at [facility], [address] [place], [place] [address]   TSH     Status: None   Collection Time: 07/11/20  12:40 AM  Result Value Ref Range   TSH 2.490 0.350 - 4.500 uIU/mL    Comment: Performed by a 3rd Generation assay with a functional sensitivity of <=0.01 uIU/mL. Performed at [facility], [place] [address] [place], [place] [address]   Sedimentation rate     Status: None   Collection Time: 07/11/20 12:40 AM  Result Value Ref Range   Sed Rate 1 0 - 22 mm/hr    Comment: Performed at [facility], [address] [place], [place] [address]  Magnesium     Status: None   Collection Time: 07/11/20  5:53 PM  Result Value Ref Range   Magnesium 1.8 1.7 - 2.4 mg/dL    Comment: Performed at [facility], [place] [address] [place],  [address]  Lipid panel     Status: Abnormal   Collection Time: 07/12/20  5:37 AM  Result Value Ref [place]   Cholesterol 206 (H) 0 - 200 mg/dL   Triglycerides 85 <150 mg/dL   HDL 43 >40 mg/dL   Total CHOL/HDL Ratio 4.8 RATIO   VLDL 17 0 - 40 mg/dL   LDL Cholesterol 146 (H) 0 - 99 mg/dL    Comment:        Total Cholesterol/HDL:CHD Risk Coronary Heart Disease Risk Table                     Men   Women  1/2 Average Risk   3.4   3.3  Average Risk       5.0   4.4  2 X Average Risk   9.6   7.1  3 X Average Risk  23.4   11.0        Use the calculated Patient Ratio above and the CHD Risk Table to determine the patient's CHD Risk.        ATP III CLASSIFICATION (LDL):  <100  mg/dL   Optimal  100-129  mg/dL   Near or Above                    Optimal  130-159  mg/dL   Borderline  160-189  mg/dL   High  >190     mg/dL   Very High Performed at [facility] [address] [place], [place] [address]   Hemoglobin A1c     Status: None   Collection Time: 07/12/20  5:37 AM  Result Value Ref Range   Hgb A1c MFr Bld 4.9 4.8 - 5.6 %    Comment: (NOTE) Pre diabetes:          5.7%-6.4%  Diabetes:              >6.4%  Glycemic control for   <7.0% adults with diabetes    Mean Plasma Glucose 93.93 mg/dL    Comment: Performed at [facility] [address], [place], [place] [address]  HIV Antibody (routine testing w rflx)     Status: None   Collection Time: 07/12/20  5:37 AM  Result Value Ref Range   HIV Screen 4th Generation wRfx Non Reactive Non Reactive    Comment: Performed at [facility], [place] [address], [place], [place] [address]  ECHOCARDIOGRAM COMPLETE     Status: None   Collection Time: 07/12/20  9:36 AM  Result Value Ref Range   Weight 2,051.2 oz   Height 66 in   BP 111/69 mmHg   S' Lateral 3.10 cm   AR max vel 2.26 cm2   AV Area VTI 1.87 cm2   AV Mean grad 4.0 mmHg   AV Peak grad 6.7 mmHg   Ao pk vel 1.29 m/s   Area-P 1/2 3.42 cm2   AV Area mean vel 1.93 cm2  T4, free     Status: None   Collection Time: 07/23/20  1:52 PM  Result Value Ref Range   Free T4 0.88 0.60 - 1.60 ng/dL    Comment: Specimens from patients who are undergoing biotin therapy and /or ingesting biotin supplements may contain high levels of biotin.  The higher biotin concentration in these specimens interferes with this Free T4 assay.  Specimens that contain high levels  of biotin may cause false high results for this Free T4 assay.  Please interpret results in light of the total clinical presentation of the patient.    T3, free     Status: None   Collection Time: 07/23/20  1:52 PM  Result Value Ref Range   T3, Free 3.7 2.3 - 4.2 pg/mL  Comprehensive metabolic panel     Status: Abnormal   Collection Time: 07/23/20  1:52 PM  Result Value Ref [place]   Sodium 136 135 - 145 mEq/L   Potassium 3.4 (L) 3.5 - 5.1 mEq/L   Chloride 101 96 - 112 mEq/L   CO2 24 19 - 32 mEq/L   Glucose, Bld 90 70 - 99 mg/dL   BUN 6 6 - 23 mg/dL   Creatinine, Ser 0.53 0.40 - 1.20 mg/dL   Total Bilirubin 0.5 0.2 - 1.2 mg/dL   Alkaline Phosphatase 50 39 - 117 U/L   AST 15 0 - 37 U/L   ALT 13 0 - 35 U/L   Total Protein 7.1 6.0 - 8.3 g/dL   Albumin 4.6 3.5 - 5.2 g/dL   GFR 126.07 >60.00 mL/min   Calcium 9.5 8.4 - 10.5  mg/dL     Assessment/Plan: 1. Tachycardia Holter monitor has been returned to Cardiology. Awaiting results. Slight tachycardia at rest today at 105 bpm. Again prior cardiac workup in ER unremarkable. Thyroid function stable. Again discussed with her feel that her Vyvanse is contributing and again recommend she stop medication and discuss alternative with her behavioral health provider. Will check further labs today due to her concern. She is agreeable to BB for symptom control while workup is in progress. Rx Metoprolol XL. Supportive measures reviewed. Strict ER precautions reviewed with patient.  - Antinuclear Antib (ANA) - CRP High sensitivity - B12 and Folate Panel  2. Urinary urgency Patient has concern about diabetes. Again reassured her that last A1C was < 5. No sign of diabetes. No other signs/symptoms to suggest UTI. Will check UA with reflex microscopic and culture. Patient to avoid caffeine. Supportive measures reviewed. - Urinalysis, Routine w reflex microscopic  3. Hypokalemia Notes good fluid intake and intake of potassium-rich foods. Will recheck levels today.  - Urinalysis, Routine w reflex microscopic - B12 and Folate Panel  4. Gastroesophageal reflux disease without esophagitis Will switch to Dexilant. Nightly Famotidine Continue GERD diet. REferral to GI has been placed for patient.   This visit occurred during the SARS-CoV-2 public health emergency.  Safety protocols were in place, including screening questions prior to the visit, additional usage of staff PPE, and extensive cleaning of exam room while observing appropriate contact time as indicated for disinfecting solutions.     Leeanne Rio, PA-C

## 2020-08-11 ENCOUNTER — Encounter: Payer: Self-pay | Admitting: Physician Assistant

## 2020-08-11 LAB — ANTI-NUCLEAR AB-TITER (ANA TITER)
ANA TITER: 1:160 {titer} — ABNORMAL HIGH
ANA Titer 1: 1:40 {titer} — ABNORMAL HIGH

## 2020-08-11 LAB — TIQ-MISC

## 2020-08-11 LAB — B12 AND FOLATE PANEL
Folate: 4.6 ng/mL — ABNORMAL LOW
Vitamin B-12: 957 pg/mL (ref 200–1100)

## 2020-08-11 LAB — HIGH SENSITIVITY CRP: hs-CRP: 1.6 mg/L

## 2020-08-11 LAB — ANA: Anti Nuclear Antibody (ANA): POSITIVE — AB

## 2020-08-12 ENCOUNTER — Telehealth (INDEPENDENT_AMBULATORY_CARE_PROVIDER_SITE_OTHER): Payer: 59 | Admitting: Physician Assistant

## 2020-08-12 ENCOUNTER — Other Ambulatory Visit: Payer: Self-pay | Admitting: Obstetrics and Gynecology

## 2020-08-12 ENCOUNTER — Encounter: Payer: Self-pay | Admitting: Physician Assistant

## 2020-08-12 ENCOUNTER — Other Ambulatory Visit: Payer: Self-pay | Admitting: General Practice

## 2020-08-12 DIAGNOSIS — R768 Other specified abnormal immunological findings in serum: Secondary | ICD-10-CM

## 2020-08-12 NOTE — Telephone Encounter (Signed)
Discussed at video visit

## 2020-08-12 NOTE — Progress Notes (Signed)
I have discussed the procedure for the virtual visit with the patient who has given consent to proceed with assessment and treatment.   Clarity Ciszek S Elwanda Moger, CMA     

## 2020-08-12 NOTE — Progress Notes (Signed)
Virtual Visit via Video   I connected with patient on 08/12/20 at  2:30 PM EDT by a video enabled telemedicine application and verified that I am speaking with the correct person using two identifiers.  Location patient: Home Location provider: [facility], Office Persons participating in the virtual visit: Patient, Provider, [place] (Patina Moore)  I discussed the limitations of evaluation and management by telemedicine and the availability of in person appointments. The patient expressed understanding and agreed to proceed.  Subjective:   HPI:   Patient presents via [place] today to review recent labs results and to discuss next steps regarding them. Also wanting to let us know she turned in her holter monitor 1 week ago -- results pending. Patient recently with AI workup including ESR, Hs-CRP and ANA. ESR and CRP negative but ANA positive with positive titer at 1:160 with speckled pattern.   ROS:   See pertinent positives and negatives per HPI.  Patient Active Problem List   Diagnosis Date Noted  . Elevated LDL cholesterol level 07/12/2020  . ADHD 07/11/2020  . Chest pain 07/10/2020  . Arthralgia 03/10/2014  . Abdominal pain 03/10/2014  . Headache 03/10/2014  . Depression 03/10/2014  . Reflux esophagitis 03/10/2014  . Rectal pain 02/15/2014  . Menorrhagia     Social History   Tobacco Use  . Smoking status: Former Smoker    Types: Cigarettes  . Smokeless tobacco: Never Used  Substance Use Topics  . Alcohol use: Not Currently    Alcohol/week: 7.0 standard drinks    Types: 6 Shots of liquor, 1 Standard drinks or equivalent per week    Current Outpatient Medications:  .  clonazePAM (KLONOPIN) 0.5 MG tablet, Take 0.5 mg by mouth 2 (two) times daily as needed for anxiety., Disp: , Rfl:  .  Cyanocobalamin (VITAMIN B-12 PO), Take 1 capsule by mouth daily., Disp: , Rfl:  .  desvenlafaxine (PRISTIQ) 100 MG 24 hr tablet, Take 100 mg by mouth daily. , Disp: , Rfl:    .  dexlansoprazole (DEXILANT) 60 MG capsule, Take 1 capsule (60 mg total) by mouth daily., Disp: 30 capsule, Rfl: 1 .  Lactobacillus (PROBIOTIC ACIDOPHILUS PO), Take 1 capsule by mouth daily. , Disp: , Rfl:  .  lisdexamfetamine (VYVANSE) 40 MG capsule, Take 1 capsule (40 mg total) by mouth every morning., Disp: 30 capsule, Rfl: 0 .  metoprolol succinate (TOPROL-XL) 25 MG 24 hr tablet, Take 1 tablet (25 mg total) by mouth daily., Disp: 30 tablet, Rfl: 3 .  naproxen sodium (ANAPROX) 550 MG tablet, SMARTSIG:1 Tablet(s) By Mouth Every 12 Hours PRN, Disp: , Rfl:  .  Omega-3 Fatty Acids (FISH OIL PO), Take 1 capsule by mouth daily., Disp: , Rfl:  .  ondansetron (ZOFRAN) 8 MG tablet, Take 1 tablet (8 mg total) by mouth daily as needed for nausea., Disp: 20 tablet, Rfl: 0 .  traZODone (DESYREL) 100 MG tablet, Take 200 mg by mouth at bedtime. , Disp: , Rfl:  .  VITAMIN D PO, Take 1 capsule by mouth daily., Disp: , Rfl:  .  Vitamin D, Ergocalciferol, (DRISDOL) 1.25 MG (50000 UNIT) CAPS capsule, Take 50,000 Units by mouth once a week., Disp: , Rfl:   Allergies  Allergen Reactions  . Atrovent [Ipratropium] Other (See Comments)    Uncontrollable nosebleeds     Objective:   There were no vitals taken for this visit.  Patient is well-developed, well-nourished in no acute distress.  Resting comfortably at home.  Head is  normocephalic, atraumatic.  No labored breathing.  Speech is clear and coherent with logical content.  Patient is alert and oriented at baseline.   Assessment and Plan:   1. Positive ANA (antinuclear antibody) With positive titer. Discussed potential for false positive in the setting of other normal markers of inflammation. Giving her positive titer in combination with the constellation of symptoms she has been dealing with, want to proceed with Rheumatology consultation that has already been placed. Patient voiced understanding and agreement.     Leeanne Rio,  PA-C 08/12/2020

## 2020-08-21 ENCOUNTER — Ambulatory Visit (INDEPENDENT_AMBULATORY_CARE_PROVIDER_SITE_OTHER): Payer: 59 | Admitting: Nurse Practitioner

## 2020-08-21 ENCOUNTER — Other Ambulatory Visit (INDEPENDENT_AMBULATORY_CARE_PROVIDER_SITE_OTHER): Payer: 59

## 2020-08-21 ENCOUNTER — Encounter: Payer: Self-pay | Admitting: Nurse Practitioner

## 2020-08-21 ENCOUNTER — Telehealth: Payer: Self-pay | Admitting: General Practice

## 2020-08-21 VITALS — BP 110/74 | HR 99 | Ht 65.0 in | Wt 125.0 lb

## 2020-08-21 DIAGNOSIS — K219 Gastro-esophageal reflux disease without esophagitis: Secondary | ICD-10-CM

## 2020-08-21 DIAGNOSIS — R634 Abnormal weight loss: Secondary | ICD-10-CM | POA: Diagnosis not present

## 2020-08-21 DIAGNOSIS — R110 Nausea: Secondary | ICD-10-CM

## 2020-08-21 DIAGNOSIS — R079 Chest pain, unspecified: Secondary | ICD-10-CM | POA: Diagnosis not present

## 2020-08-21 DIAGNOSIS — R1084 Generalized abdominal pain: Secondary | ICD-10-CM

## 2020-08-21 DIAGNOSIS — K5900 Constipation, unspecified: Secondary | ICD-10-CM

## 2020-08-21 LAB — COMPREHENSIVE METABOLIC PANEL
ALT: 12 U/L (ref 0–35)
AST: 15 U/L (ref 0–37)
Albumin: 4.9 g/dL (ref 3.5–5.2)
Alkaline Phosphatase: 58 U/L (ref 39–117)
BUN: 9 mg/dL (ref 6–23)
CO2: 28 mEq/L (ref 19–32)
Calcium: 9.7 mg/dL (ref 8.4–10.5)
Chloride: 100 mEq/L (ref 96–112)
Creatinine, Ser: 0.56 mg/dL (ref 0.40–1.20)
GFR: 112.27 mL/min (ref >60.00)
Glucose, Bld: 90 mg/dL (ref 70–99)
Potassium: 3.5 mEq/L (ref 3.5–5.1)
Sodium: 136 mEq/L (ref 135–145)
Total Bilirubin: 0.4 mg/dL (ref 0.2–1.2)
Total Protein: 7.6 g/dL (ref 6.0–8.3)

## 2020-08-21 LAB — CBC WITH DIFFERENTIAL/PLATELET
Basophils Absolute: 0 10*3/uL (ref 0.0–0.1)
Basophils Relative: 0.3 % (ref 0.0–3.0)
Eosinophils Absolute: 0.1 10*3/uL (ref 0.0–0.7)
Eosinophils Relative: 0.5 % (ref 0.0–5.0)
HCT: 45.7 % (ref 36.0–46.0)
Hemoglobin: 15.5 g/dL — ABNORMAL HIGH (ref 12.0–15.0)
Lymphocytes Relative: 17.1 % (ref 12.0–46.0)
Lymphs Abs: 2.1 10*3/uL (ref 0.7–4.0)
MCHC: 34 g/dL (ref 30.0–36.0)
MCV: 89.5 fl (ref 78.0–100.0)
Monocytes Absolute: 0.6 10*3/uL (ref 0.1–1.0)
Monocytes Relative: 5 % (ref 3.0–12.0)
Neutro Abs: 9.5 10*3/uL — ABNORMAL HIGH (ref 1.4–7.7)
Neutrophils Relative %: 77.1 % — ABNORMAL HIGH (ref 43.0–77.0)
Platelets: 296 10*3/uL (ref 150.0–400.0)
RBC: 5.1 Mil/uL (ref 3.87–5.11)
RDW: 14.5 % (ref 11.5–15.5)
WBC: 12.4 10*3/uL — ABNORMAL HIGH (ref 4.0–10.5)

## 2020-08-21 LAB — C-REACTIVE PROTEIN: CRP: <1 mg/dL (ref 0.5–20.0)

## 2020-08-21 MED ORDER — DICYCLOMINE HCL 10 MG PO CAPS: 10.0000 mg | ORAL_CAPSULE | Freq: Three times a day (TID) | ORAL | 0 refills | Status: DC | PRN

## 2020-08-21 NOTE — Progress Notes (Signed)
08/21/2020 Lindsay Giles [id number] 10-08-1976   CHIEF COMPLAINT: GERD  HISTORY OF PRESENT ILLNESS: Lindsay Giles is a 44 year old female with a past medical history of anxiety and depression. She presents to our office today as referred by [name], PA-C for further evaluation regarding GERD symptoms.  She reports having now GERD symptoms which have progressively worsened over the past few months.  She complains of having chest pain, epigastric discomfort, and stomach pain.  She also describes feeling as if a bubble is in her esophagus which makes it difficult to eat or drink fluids which comes and goes.  She denies feeling as if food or water gets stuck in the esophagus.  She has active heartburn symptoms several times daily.  She has a frequent nonproductive cough.  She has nausea without vomiting.  She complains of a dry mouth.  Her appetite is decreased.  She is constipated, passing dark almost black hard pellet like stools every 3 days. She reported losing 25 pounds after she had Covid in August 2019.  Since then, she has lost an additional 15 pounds which she attributes to her GERD symptoms.  she was previously followed by Dr. Oletta Lamas at [place].  She reported undergoing an EGD in 2015 which identified GERD. She was taking  Omeprazole then switched to Protonix which was ineffective.  She was started on Dexilant 60 mg QD and Famotidine 20mg  QHS during her hospital admission 9/16 - 07/12/2020 for atypical chest pain and tachycardia.  At that time, a high sensitive troponin level was slightly elevated.  Cardiology was consulted and she underwent a cardiac CT which showed low risk.  An echo was normal with LVEF 60 to 65%.  Limited triage showed sinus tachycardia up to 150 BMP.  She was started on metoprolol and her tachycardia resolved.  Her chest pain was assessed to be nonspecific without concerns for ACS.  Possible GI versus anxiety etiology.  Followed up with her PCP [name], PA-C and a Holter monitor was ordered and completed, the results are pending.  ANA testing was positive and she was referred to rheumatology.  TSH was normal.  CBC Latest Ref Rng & Units 07/10/2020 01/14/2018 08/09/2016  WBC 4.0 - 10.5 K/uL 10.4 7.9 8.6  Hemoglobin 12.0 - 15.0 g/dL 14.2 13.2 14.2  Hematocrit 36 - 46 % 40.8 40.3 40.9  Platelets 150 - 400 K/uL 297 265 266   CMP Latest Ref Rng & Units 07/23/2020 07/10/2020 01/14/2018  Glucose 70 - 99 mg/dL 90 91 86  BUN 6 - 23 mg/dL 6 9 9   Creatinine 0.40 - 1.20 mg/dL 0.53 0.62 0.74  Sodium 135 - 145 mEq/L 136 135 138  Potassium 3.5 - 5.1 mEq/L 3.4(L) 3.1(L) 3.5  Chloride 96 - 112 mEq/L 101 102 106  CO2 19 - 32 mEq/L 24 21(L) 23  Calcium 8.4 - 10.5 mg/dL 9.5 9.1 9.0  Total Protein 6.0 - 8.3 g/dL 7.1 7.8 6.5  Total Bilirubin 0.2 - 1.2 mg/dL 0.5 0.7 0.2(L)  Alkaline Phos 39 - 117 U/L 50 47 48  AST 0 - 37 U/L 15 21 21   ALT 0 - 35 U/L 13 15 12(L)    Past Medical History:  Diagnosis Date  . Abnormal Pap smear   . ADD (attention deficit disorder)   . Anxiety and depression   . Menorrhagia    Past Surgical History:  Procedure Laterality Date  . DILATION AND CURETTAGE OF UTERUS    .  EXAMINATION UNDER ANESTHESIA N/A 02/17/2014   Procedure: EXAM UNDER ANESTHESIA,  ULTRASOUND OF RECTUM, INCISION AND EXCISION OF THROMBOSED CLOT ;  Surgeon: Shann Medal, MD;  Location: [facility];  Service: General;  Laterality: N/A;  . TONSILLECTOMY AND ADENOIDECTOMY      Social History: She is married.  She has 1 son and 2 daughters.  She is a [employer] park manager.  Nonsmoker. She drinks 1 glass of wine few days monthly. No drug use.   Family History: Mother 31 HTN. Father 60 HTN. Sister with GERD.  Paternal grandmother with angina, breast cancer and stroke.  Allergies  Allergen Reactions  . Atrovent [Ipratropium] Other (See Comments)    Uncontrollable nosebleeds       Outpatient Encounter Medications as of 08/21/2020  Medication Sig  . clonazePAM  (KLONOPIN) 0.5 MG tablet Take 0.5 mg by mouth 2 (two) times daily as needed for anxiety.  . Cyanocobalamin (VITAMIN B-12 PO) Take 1 capsule by mouth daily.  Marland Kitchen desvenlafaxine (PRISTIQ) 100 MG 24 hr tablet Take 100 mg by mouth daily.   Marland Kitchen dexlansoprazole (DEXILANT) 60 MG capsule Take 1 capsule (60 mg total) by mouth daily.  . Lactobacillus (PROBIOTIC ACIDOPHILUS PO) Take 1 capsule by mouth daily.   Marland Kitchen lisdexamfetamine (VYVANSE) 40 MG capsule Take 1 capsule (40 mg total) by mouth every morning.  . metoprolol succinate (TOPROL-XL) 25 MG 24 hr tablet Take 1 tablet (25 mg total) by mouth daily.  . Omega-3 Fatty Acids (FISH OIL PO) Take 1 capsule by mouth daily.  . ondansetron (ZOFRAN) 8 MG tablet Take 1 tablet (8 mg total) by mouth daily as needed for nausea.  . traZODone (DESYREL) 100 MG tablet Take 200 mg by mouth at bedtime.   Marland Kitchen VITAMIN D PO Take 1 capsule by mouth daily.  . Vitamin D, Ergocalciferol, (DRISDOL) 1.25 MG (50000 UNIT) CAPS capsule Take 50,000 Units by mouth once a week.  . [DISCONTINUED] naproxen sodium (ANAPROX) 550 MG tablet SMARTSIG:1 Tablet(s) By Mouth Every 12 Hours PRN   No facility-administered encounter medications on file as of 08/21/2020.    REVIEW OF SYSTEMS:  Gen: See HPI.  CV: Denies chest pain, palpitations or edema. Resp: Denies cough, shortness of breath of hemoptysis.  GI: Denies heartburn, dysphagia, stomach or lower abdominal pain. No diarrhea or constipation.  GU : Denies urinary burning, blood in urine, increased urinary frequency or incontinence. MS: Denies joint pain, muscles aches or weakness. Derm: Denies rash, itchiness, skin lesions or unhealing ulcers. Psych: Denies depression, anxiety, memory loss, suicidal ideation and confusion. Heme: Denies bruising, bleeding. Neuro:  Denies headaches, dizziness or paresthesias. Endo:  Denies any problems with DM, thyroid or adrenal function.  PHYSICAL EXAM: BP 110/74   Pulse 99   Ht 5\' 5"  (1.651 m)   Wt 125  lb (56.7 kg)   BMI 20.80 kg/m    Wt Readings from Last 3 Encounters:  08/21/20 125 lb (56.7 kg)  08/08/20 127 lb 3.2 oz (57.7 kg)  07/23/20 130 lb 12.8 oz (59.3 kg)   General: Well developed  44 year old female slightly anxious appearing in no acute distress.  Head: Normocephalic and atraumatic. Eyes:  Sclerae non-icteric, conjunctive pink. Ears: Normal auditory acuity. Mouth: Dentition intact. No ulcers or lesions.  Neck: Supple, no lymphadenopathy or thyromegaly.  Lungs: Clear bilaterally to auscultation without wheezes, crackles or rhonchi. Heart: Regular rate and rhythm. No murmur, rub or gallop appreciated.  Abdomen: Soft, nondistended. Moderate tenderness epigastric, periumbilical area. RLQ and central lower  abdomen without rebound or guarding. No masses. No hepatosplenomegaly. Normoactive bowel sounds x 4 quadrants.  Rectal: Deferred.  Musculoskeletal: Symmetrical with no gross deformities. Skin: Warm and dry. No rash or lesions on visible extremities. Extremities: No edema. Neurological: Alert oriented x 4, no focal deficits.  Psychological:  Alert and cooperative. Normal mood and affect.  ASSESSMENT AND PLAN:  38.  44 year old female with GERD/atypical chest pain. Less likely biliary etiology.  Recent hospital admission with atypical chest pain resulted in a negative coronary CT and normal echo.  Holter monitor completed by PCP, results pending.  -Continue Dexilant 60 mg in the a.m. and Famotidine 20 mg in the evening.  Gaviscon 1 tablespoon 3 times daily as needed. -Schedule EGD once Holter monitor results reviewed -Patient to call our office if her symptoms worsen  2.  Generalized abdominal pain -CBC, CMP, CRP -CTAP with oral and IV contrast -Dicyclomine 10 mg 1 p.o. 3 times daily  3. Anxiety most likely attributing to her GI symptoms  4.  Constipation -MiraLAX as needed -TTG and IgA  5.  Unintentional weight loss -Await results of CTAP -Schedule EGD after  Holter monitor results reviewed.  Consider scheduling a colonoscopy as well due to recent change in bowel pattern/constipation in setting of weight loss.    CC:  Brunetta Jeans, PA-C

## 2020-08-21 NOTE — Telephone Encounter (Signed)
Will forward to Shelly/Katy to see who is assigned to read monitor

## 2020-08-21 NOTE — Telephone Encounter (Signed)
Patient calling for monitor results. She states she called her PCP and they have not received the results either. There is currently no doctor assigned to read the results.

## 2020-08-21 NOTE — Patient Instructions (Signed)
Your provider has requested that you go to the basement level for lab work before leaving today. Press "B" on the elevator. The lab is located at the first door on the left as you exit the elevator.   We have sent the following medications to your pharmacy for you to pick up at your convenience: dicyclomine.   Continue Dexilant and famotidine.   Start over the counter Gaviscon 1 tablespoon three times a day as needed for heartburn.  Start over the counter Miralax at bedtime as needed.   Go directly to the ER if severe abdominal pain occurs.  You have been scheduled for a CT scan of the abdomen and pelvis at [facility] ([address]---this is in the same building as [employer]).   You are scheduled on 08/28/20 at 11:30am. You should arrive 15 minutes prior to your appointment time for registration. Please follow the written instructions below on the day of your exam:  WARNING: IF YOU ARE ALLERGIC TO IODINE/X-RAY DYE, PLEASE NOTIFY RADIOLOGY IMMEDIATELY AT [phone]! YOU WILL BE GIVEN A 13 HOUR PREMEDICATION PREP.  1) Do not eat or drink anything after 7:30am (4 hours prior to your test) 2) You have been given 2 bottles of oral contrast to drink. The solution may taste better if refrigerated, but do NOT add ice or any other liquid to this solution. Shake well before drinking.    Drink 1 bottle of contrast @ 9:30am (2 hours prior to your exam)  Drink 1 bottle of contrast @ 10:30am (1 hour prior to your exam)  You may take any medications as prescribed with a small amount of water, if necessary. If you take any of the following medications: METFORMIN, GLUCOPHAGE, GLUCOVANCE, AVANDAMET, RIOMET, FORTAMET, [place] MET, JANUMET, GLUMETZA or METAGLIP, you MAY be asked to HOLD this medication 48 hours AFTER the exam.  The purpose of you drinking the oral contrast is to aid in the visualization of your intestinal tract. The contrast solution may cause some diarrhea.  Depending on your individual set of symptoms, you may also receive an intravenous injection of x-ray contrast/dye. Plan on being at [facility] for 30 minutes or longer, depending on the type of exam you are having performed.  This test typically takes 30-45 minutes to complete.  If you have any questions regarding your exam or if you need to reschedule, you may call the CT department at [phone] between the hours of 8:00 am and 5:00 pm, Monday-Friday.  ___________________________________________________________   Due to recent changes in healthcare laws, you may see the results of your imaging and laboratory studies on MyChart before your provider has had a chance to review them.  We understand that in some cases there may be results that are confusing or concerning to you. Not all laboratory results come back in the same time frame and the provider may be waiting for multiple results in order to interpret others.  Please give [place] 48 hours in order for your provider to thoroughly review all the results before contacting the office for clarification of your results.   Normal BMI (Body Mass Index- based on height and weight) is between 19 and 25. Your BMI today is Body mass index is 20.8 kg/m. Marland Kitchen Please consider follow up  regarding your BMI with your Primary Care Provider.

## 2020-08-22 ENCOUNTER — Telehealth: Payer: Self-pay | Admitting: Physician Assistant

## 2020-08-22 ENCOUNTER — Encounter: Payer: Self-pay | Admitting: Physician Assistant

## 2020-08-22 ENCOUNTER — Other Ambulatory Visit: Payer: Self-pay | Admitting: Emergency Medicine

## 2020-08-22 DIAGNOSIS — R000 Tachycardia, unspecified: Secondary | ICD-10-CM

## 2020-08-22 LAB — TISSUE TRANSGLUTAMINASE, IGA: (tTG) Ab, IgA: <1 U/mL

## 2020-08-22 LAB — IGA: Immunoglobulin A: 268 mg/dL (ref 47–310)

## 2020-08-22 NOTE — Telephone Encounter (Signed)
Just received the results finally -- see result note and please give patient a call.

## 2020-08-22 NOTE — Telephone Encounter (Signed)
Results were imported on 10/22 and assigned to the DOD Burt Knack) to review

## 2020-08-22 NOTE — Telephone Encounter (Signed)
Will forward to monitor technicians for review.  I do not see results.

## 2020-08-22 NOTE — Telephone Encounter (Signed)
Please advise of results of holter monitor study for patient

## 2020-08-22 NOTE — Telephone Encounter (Signed)
Patient notified of results. Referral placed for Cardiology

## 2020-08-22 NOTE — Telephone Encounter (Signed)
Please call back about holster monitor readings

## 2020-08-24 NOTE — Progress Notes (Signed)
Attending Physician's Attestation   I have reviewed the chart.   I agree with the Advanced Practitioner's note, impression, and recommendations with any updates as below. Agree with endoscopic evaluation once Holter monitor results are reviewed to ensure safety.  Would definitely perform an EGD and with changes in her bowel habits strongly consider colonoscopy as well.   Justice Britain, MD [facility]oscopy Office # [phone]

## 2020-08-25 HISTORY — PX: ESOPHAGEAL DILATION: SHX303

## 2020-08-26 ENCOUNTER — Telehealth: Payer: Self-pay | Admitting: Nurse Practitioner

## 2020-08-26 NOTE — Telephone Encounter (Signed)
Pt has questions about her lab results and needs to clarify instructions for her imaging test tomorrow. Pls call her.

## 2020-08-26 NOTE — Telephone Encounter (Signed)
Confirming the CT prep instructions for the patient. We instructed her to fast for 4 hours prior. She received a voicemail that instructed her she can have clear liquids. She wanted to be certain she did the correct thing. I have given the phone number to [place] [facility]. She will call to confirm the instructions they want her to follow.

## 2020-08-27 ENCOUNTER — Other Ambulatory Visit: Payer: Self-pay

## 2020-08-27 ENCOUNTER — Ambulatory Visit (INDEPENDENT_AMBULATORY_CARE_PROVIDER_SITE_OTHER)
Admission: RE | Admit: 2020-08-27 | Discharge: 2020-08-27 | Disposition: A | Discharge status: Home / Self Care | Payer: 59 | Source: Ambulatory Visit | Attending: Nurse Practitioner | Admitting: Nurse Practitioner

## 2020-08-27 DIAGNOSIS — R1084 Generalized abdominal pain: Secondary | ICD-10-CM | POA: Diagnosis not present

## 2020-08-27 DIAGNOSIS — R634 Abnormal weight loss: Secondary | ICD-10-CM

## 2020-08-27 MED ORDER — IOHEXOL 300 MG/ML  SOLN
80.0000 mL | Freq: Once | INTRAMUSCULAR | Status: AC | PRN
  Administered 2020-08-27: 100 mL via INTRAVENOUS

## 2020-08-27 NOTE — Progress Notes (Signed)
Noted. Patient has CTAP scheduled today, await results. Pt to be scheduled for EGD and colonoscopy pending CTAP results. I contacted her PCP and her Holter Monitor test was ok, no significant arrhythmias.

## 2020-08-27 NOTE — Telephone Encounter (Signed)
Lindsay Pick, NP  [name], Real Cons, LPN Cc: [name], Telford Nab., MD Beth, pls contact the patient and schedule her for an EGD/colonoscopy with Dr. Rush Landmark.   I called the patient and discussed her CTAP results. EGD and colonoscopy recommended, refer to office visit 10/28. She is in agreement to proceed with an EGD and colonoscopy.   I received a msg from her PCP [name] regarding her Holter Monitor study, his msg as follows: "Results are in. No worrisome arrhythmias -- just labile rate while in NSR, occasional PVCs. Some ectopic atrial beats noted. She can have her imaging and procedures with you guys. Giving her ongoing issue with tachycardia I am having her speak with a Cardiologist directly as well. Just wanted to give you an update".   Patient will follow up with her gyn regarding left ovarian cyst and uterine fibroids

## 2020-08-27 NOTE — Telephone Encounter (Signed)
Patient called states she is now having a lot of indigestion and would like to know if she can take Pepcid

## 2020-08-27 NOTE — Telephone Encounter (Signed)
Called patient back. No answer. Left her a message. She may take the Gaviscon or the Pepcid. CT results have not been reviewed by the provider. I will call her when they have.

## 2020-08-28 ENCOUNTER — Inpatient Hospital Stay: Admission: RE | Admit: 2020-08-28 | Payer: 59 | Source: Ambulatory Visit

## 2020-09-03 ENCOUNTER — Ambulatory Visit (AMBULATORY_SURGERY_CENTER): Payer: Self-pay

## 2020-09-03 ENCOUNTER — Encounter: Payer: Self-pay | Admitting: Gastroenterology

## 2020-09-03 ENCOUNTER — Telehealth: Payer: Self-pay

## 2020-09-03 ENCOUNTER — Other Ambulatory Visit: Payer: Self-pay

## 2020-09-03 VITALS — Ht 65.0 in | Wt 126.0 lb

## 2020-09-03 DIAGNOSIS — Z01818 Encounter for other preprocedural examination: Secondary | ICD-10-CM

## 2020-09-03 DIAGNOSIS — R1084 Generalized abdominal pain: Secondary | ICD-10-CM

## 2020-09-03 DIAGNOSIS — K219 Gastro-esophageal reflux disease without esophagitis: Secondary | ICD-10-CM

## 2020-09-03 MED ORDER — PEG 3350-KCL-NA BICARB-NACL 420 G PO SOLR: 4000.0000 mL | Freq: Once | ORAL | 0 refills | Status: AC

## 2020-09-03 NOTE — Progress Notes (Signed)
No allergies to soy or egg Pt is not on blood thinners or diet pills Denies issues with sedation/intubation Denies atrial flutter/fib Denies constipation   Emmi instructions given to pt  Pt is aware of Covid safety and care partner requirements.   During [facility], pt notes she was dx in Sept with rapid heart rate,  She has had a holter with results in the chart.  Her PCP has cleared her for procedures, but she has a cardiology appt 11/19 after the procedure. References include Holter results and TE - 11/2.  TE sent to [facility] for review prior to procedure.

## 2020-09-03 NOTE — Telephone Encounter (Signed)
Good Morning John  During [facility], pt notes she was dx in Sept with rapid heart rate.  She has been placed on medication for this  She has had a holter with results in the chart.    Her PCP has cleared her for procedures, but she has a cardiology appt 11/19 after the procedure.   References include Holter results and TE - 11/2.    Please review chart to see if you are comfortable proceeding.  Thank you.

## 2020-09-04 ENCOUNTER — Encounter: Payer: 59 | Admitting: Gastroenterology

## 2020-09-04 NOTE — Telephone Encounter (Signed)
Called pt to confirm she has been cleared for procedure.  Detailed VM left on preferred number that clearly identified its owner was patient-confirming procedures can proceed and to call if there are any questions.

## 2020-09-04 NOTE — Telephone Encounter (Signed)
Lindsay Giles,  This pt is cleared for anesthetic care at [facility].  Thanks,  Nalda Shackleford 

## 2020-09-05 ENCOUNTER — Other Ambulatory Visit: Payer: Self-pay | Admitting: Gastroenterology

## 2020-09-05 LAB — SARS CORONAVIRUS 2 (TAT 6-24 HRS): SARS Coronavirus 2: NEGATIVE

## 2020-09-09 ENCOUNTER — Other Ambulatory Visit: Payer: Self-pay

## 2020-09-09 ENCOUNTER — Ambulatory Visit (AMBULATORY_SURGERY_CENTER): Payer: 59 | Admitting: Gastroenterology

## 2020-09-09 ENCOUNTER — Ambulatory Visit (INDEPENDENT_AMBULATORY_CARE_PROVIDER_SITE_OTHER): Payer: 59 | Admitting: Psychology

## 2020-09-09 ENCOUNTER — Encounter: Payer: Self-pay | Admitting: Gastroenterology

## 2020-09-09 VITALS — BP 121/83 | HR 68 | Temp 96.9°F | Resp 16 | Ht 65.0 in | Wt 126.0 lb

## 2020-09-09 DIAGNOSIS — F909 Attention-deficit hyperactivity disorder, unspecified type: Secondary | ICD-10-CM

## 2020-09-09 DIAGNOSIS — K2970 Gastritis, unspecified, without bleeding: Secondary | ICD-10-CM

## 2020-09-09 DIAGNOSIS — R1084 Generalized abdominal pain: Secondary | ICD-10-CM | POA: Diagnosis not present

## 2020-09-09 DIAGNOSIS — F331 Major depressive disorder, recurrent, moderate: Secondary | ICD-10-CM | POA: Diagnosis not present

## 2020-09-09 DIAGNOSIS — K219 Gastro-esophageal reflux disease without esophagitis: Secondary | ICD-10-CM

## 2020-09-09 DIAGNOSIS — R12 Heartburn: Secondary | ICD-10-CM

## 2020-09-09 DIAGNOSIS — K634 Enteroptosis: Secondary | ICD-10-CM | POA: Diagnosis not present

## 2020-09-09 DIAGNOSIS — K641 Second degree hemorrhoids: Secondary | ICD-10-CM | POA: Diagnosis not present

## 2020-09-09 DIAGNOSIS — K5900 Constipation, unspecified: Secondary | ICD-10-CM | POA: Diagnosis present

## 2020-09-09 DIAGNOSIS — K2950 Unspecified chronic gastritis without bleeding: Secondary | ICD-10-CM | POA: Diagnosis not present

## 2020-09-09 DIAGNOSIS — K449 Diaphragmatic hernia without obstruction or gangrene: Secondary | ICD-10-CM | POA: Diagnosis not present

## 2020-09-09 MED ORDER — SODIUM CHLORIDE 0.9 % IV SOLN: 500.0000 mL | Freq: Once | INTRAVENOUS | Status: DC

## 2020-09-09 NOTE — Op Note (Signed)
[facility] Patient Name: Lindsay Giles Procedure Date: 09/09/2020 3:23 PM MRN: [id number] Endoscopist: Justice Britain , MD Age: 44 Referring MD:  Date of Birth: 11-07-1975 Gender: Female Account #: [id number] Procedure:                Colonoscopy Indications:              Generalized abdominal pain, Constipation, Weight                            loss Medicines:                Monitored Anesthesia Care Procedure:                Pre-Anesthesia Assessment:                           - Prior to the procedure, a History and Physical                            was performed, and patient medications and                            allergies were reviewed. The patient's tolerance of                            previous anesthesia was also reviewed. The risks                            and benefits of the procedure and the sedation                            options and risks were discussed with the patient.                            All questions were answered, and informed consent                            was obtained. Prior Anticoagulants: The patient has                            taken no previous anticoagulant or antiplatelet                            agents except for NSAID medication. ASA Grade                            Assessment: II - A patient with mild systemic                            disease. After reviewing the risks and benefits,                            the patient was deemed in satisfactory condition to  undergo the procedure.                           After obtaining informed consent, the colonoscope                            was passed under direct vision. Throughout the                            procedure, the patient's blood pressure, pulse, and                            oxygen saturations were monitored continuously. The                            Colonoscope was introduced through the anus and                             advanced to the 5 cm into the ileum. The                            colonoscopy was performed without difficulty. The                            patient tolerated the procedure. The quality of the                            bowel preparation was adequate. The terminal ileum,                            ileocecal valve, appendiceal orifice, and rectum                            were photographed. Scope In: 4:01:17 PM Scope Out: 4:18:20 PM Scope Withdrawal Time: 0 hours 9 minutes 11 seconds  Total Procedure Duration: 0 hours 17 minutes 3 seconds  Findings:                 The digital rectal exam findings include                            hemorrhoids. Pertinent negatives include no                            palpable rectal lesions.                           The terminal ileum and ileocecal valve appeared                            normal.                           Normal mucosa was found in the entire colon.  Non-bleeding non-thrombosed external and internal                            hemorrhoids were found during retroflexion, during                            perianal exam and during digital exam. The                            hemorrhoids were Grade II (internal hemorrhoids                            that prolapse but reduce spontaneously). Complications:            No immediate complications. Estimated Blood Loss:     Estimated blood loss: none. Impression:               - Hemorrhoids found on digital rectal exam.                           - The examined portion of the ileum was normal.                           - Normal mucosa in the entire examined colon.                           - Non-bleeding non-thrombosed external and internal                            hemorrhoids. Recommendation:           - The patient will be observed post-procedure,                            until all discharge criteria are met.                           - Discharge patient to  home.                           - Patient has a contact number available for                            emergencies. The signs and symptoms of potential                            delayed complications were discussed with the                            patient. Return to normal activities tomorrow.                            Written discharge instructions were provided to the                            patient.                           -  High fiber diet.                           - Continue present medications.                           - Repeat colonoscopy in 10 years for screening                            purposes.                           - Return to GI clinic at appointment to be                            scheduled.                           - The findings and recommendations were discussed                            with the patient.                           - The findings and recommendations were discussed                            with the patient's family. Justice Britain, MD 09/09/2020 4:42:55 PM

## 2020-09-09 NOTE — Progress Notes (Signed)
To pacu, VSS. Report to [email or web address] 

## 2020-09-09 NOTE — Progress Notes (Signed)
Vs Lindsay Ro LPN  Pt's states no medical or surgical changes since previsit or office visit.

## 2020-09-09 NOTE — Patient Instructions (Signed)
Thank you for allowing [place] to care for you today!  Await pathology results approximately 7-10 days.  Will make recommendations at that time, if needed.  Resume previous  medications today.  Follow soft diet today.  Handout given.  Resume regular diet tomorrow.  Return to your normal activities tomorrow.  Recommend next screening colonoscopy in 10 years.      YOU HAD AN ENDOSCOPIC PROCEDURE TODAY AT [facility]:   Refer to the procedure report that was given to you for any specific questions about what was found during the examination.  If the procedure report does not answer your questions, please call your gastroenterologist to clarify.  If you requested that your care partner not be given the details of your procedure findings, then the procedure report has been included in a sealed envelope for you to review at your convenience later.  YOU SHOULD EXPECT: Some feelings of bloating in the abdomen. Passage of more gas than usual.  Walking can help get rid of the air that was put into your GI tract during the procedure and reduce the bloating. If you had a lower endoscopy (such as a colonoscopy or flexible sigmoidoscopy) you may notice spotting of blood in your stool or on the toilet paper. If you underwent a bowel prep for your procedure, you may not have a normal bowel movement for a few days.  Please Note:  You might notice some irritation and congestion in your nose or some drainage.  This is from the oxygen used during your procedure.  There is no need for concern and it should clear up in a day or so.  SYMPTOMS TO REPORT IMMEDIATELY:   Following lower endoscopy (colonoscopy or flexible sigmoidoscopy):  Excessive amounts of blood in the stool  Significant tenderness or worsening of abdominal pains  Swelling of the abdomen that is new, acute  Fever of 100F or higher   Following upper endoscopy (EGD)  Vomiting of blood or coffee ground material  New chest pain  or pain under the shoulder blades  Painful or persistently difficult swallowing  New shortness of breath  Fever of 100F or higher  Black, tarry-looking stools  For urgent or emergent issues, a gastroenterologist can be reached at any hour by calling [phone]. Do not use MyChart messaging for urgent concerns.    DIET:  We do recommend a small meal at first, but then you may proceed to your regular diet.  Drink plenty of fluids but you should avoid alcoholic beverages for 24 hours.  ACTIVITY:  You should plan to take it easy for the rest of today and you should NOT DRIVE or use heavy machinery until tomorrow (because of the sedation medicines used during the test).    FOLLOW UP: Our staff will call the number listed on your records 48-72 hours following your procedure to check on you and address any questions or concerns that you may have regarding the information given to you following your procedure. If we do not reach you, we will leave a message.  We will attempt to reach you two times.  During this call, we will ask if you have developed any symptoms of COVID 19. If you develop any symptoms (ie: fever, flu-like symptoms, shortness of breath, cough etc.) before then, please call [phone].  If you test positive for Covid 19 in the 2 weeks post procedure, please call and report this information to [place].    If any biopsies were taken you  will be contacted by phone or by letter within the next 1-3 weeks.  Please call us at [phone] if you have not heard about the biopsies in 3 weeks.    SIGNATURES/CONFIDENTIALITY: You and/or your care partner have signed paperwork which will be entered into your electronic medical record.  These signatures attest to the fact that that the information above on your After Visit Summary has been reviewed and is understood.  Full responsibility of the confidentiality of this discharge information lies with you and/or your care-partner.

## 2020-09-09 NOTE — Op Note (Signed)
[facility] Patient Name: Lindsay Giles Procedure Date: 09/09/2020 3:23 PM MRN: [id number] Endoscopist: Justice Britain , MD Age: 44 Referring MD:  Date of Birth: 07-13-1976 Gender: Female Account #: [id number] Procedure:                Upper GI endoscopy Indications:              Generalized abdominal pain, Heartburn, Abnormal CT                            of the GI tract (esophageal thickening noted) Medicines:                Monitored Anesthesia Care Procedure:                Pre-Anesthesia Assessment:                           - Prior to the procedure, a History and Physical                            was performed, and patient medications and                            allergies were reviewed. The patient's tolerance of                            previous anesthesia was also reviewed. The risks                            and benefits of the procedure and the sedation                            options and risks were discussed with the patient.                            All questions were answered, and informed consent                            was obtained. Prior Anticoagulants: The patient has                            taken no previous anticoagulant or antiplatelet                            agents except for NSAID medication. ASA Grade                            Assessment: II - A patient with mild systemic                            disease. After reviewing the risks and benefits,                            the patient was deemed in satisfactory condition to  undergo the procedure.                           After obtaining informed consent, the endoscope was                            passed under direct vision. Throughout the                            procedure, the patient's blood pressure, pulse, and                            oxygen saturations were monitored continuously. The                            Endoscope was  introduced through the mouth, and                            advanced to the second part of duodenum. The upper                            GI endoscopy was accomplished without difficulty.                            The patient tolerated the procedure. Scope In: Scope Out: Findings:                 No gross lesions were noted in the entire                            esophagus. Biopsies were taken with a cold forceps                            for histology to rule out EoE/LoE. After the rest                            of the EGD was complete, a guidewire was placed and                            the scope was withdrawn. Dilation was performed                            with a Savary dilator with mild resistance at 18                            mm. The dilation site was examined following                            endoscope reinsertion and showed no change.                           A 2 cm hiatal hernia was present.  No other gross lesions were noted in the entire                            examined stomach. Biopsies were taken with a cold                            forceps for histology and Helicobacter pylori                            testing.                           No gross lesions were noted in the duodenal bulb,                            in the first portion of the duodenum and in the                            second portion of the duodenum. Biopsies for                            histology were taken with a cold forceps for                            evaluation of celiac disease. Complications:            No immediate complications. Estimated Blood Loss:     Estimated blood loss was minimal. Impression:               - No gross lesions in esophagus. Biopsied. Dilated.                           - 2 cm hiatal hernia.                           - No other gross lesions in the stomach. Biopsied.                           - No gross lesions in the duodenal  bulb, in the                            first portion of the duodenum and in the second                            portion of the duodenum. Biopsied. Recommendation:           - Proceed to scheduled colonoscopy.                           - Dilation diet as per protocol.                           - Continue present medications.                           -  Await pathology results.                           - The findings and recommendations were discussed                            with the patient.                           - The findings and recommendations were discussed                            with the patient's family. Justice Britain, MD 09/09/2020 4:38:15 PM

## 2020-09-11 ENCOUNTER — Telehealth: Payer: Self-pay

## 2020-09-11 ENCOUNTER — Other Ambulatory Visit: Payer: Self-pay | Admitting: Obstetrics and Gynecology

## 2020-09-11 NOTE — Telephone Encounter (Signed)
Thank you for update. I am sorry that she is still having issues. Not unexpected as you stated in regards to her having had a dilation. Recommend the Cepacol lozenges and Chloraseptic spray in interim but suspect in next 48 hours things will improve. Patty, can you reach out to patient tomorrow and see how she is doing? Thank you. GM

## 2020-09-11 NOTE — Telephone Encounter (Signed)
Covid-19 screening questions   Do you now or have you had a fever in the last 14 days? No.  Do you have any respiratory symptoms of shortness of breath or cough now or in the last 14 days? Pt. Reports some coughing since her upper endoscopy, but it is showing signs of improvement.  Do you have any family members or close contacts with diagnosed or suspected Covid-19 in the past 14 days? No.Have you been tested for Covid-19 and found to be positive?       Follow up Call-  Call back number 09/09/2020  Post procedure Call Back phone  # [phone]  Permission to leave phone message Yes  Some recent data might be hidden     Patient questions:  Do you have a fever, pain , or abdominal swelling? No. Pain Score  0 *  Have you tolerated food without any problems? No.  Have you been able to return to your normal activities? Yes.    Do you have any questions about your discharge instructions: Diet   No. Medications  No. Follow up visit  No.  Do you have questions or concerns about your Care? Pt. Reports she has had a sore throat and some coughing and ability to swallow food is still not returned to "normal" since her procedure 09/09/2020. Pt. Is able to eat, just requires greater attention to selection of foods she can tolerate. Upon inquiry, pt. Reports it has improved to some degree as compared to the 16th.  Told pt. That it is not unusual to have these symptoms following upper endoscopy, but that symptoms should continue to diminish with time.  Told pt. To call should she not improve, or worsen.  Pt. Has our after hours no.  Actions: * If pain score is 4 or above: No action needed, pain <4.

## 2020-09-12 ENCOUNTER — Other Ambulatory Visit: Payer: Self-pay

## 2020-09-12 ENCOUNTER — Encounter: Payer: Self-pay | Admitting: Cardiology

## 2020-09-12 ENCOUNTER — Ambulatory Visit (INDEPENDENT_AMBULATORY_CARE_PROVIDER_SITE_OTHER): Payer: 59 | Admitting: Cardiology

## 2020-09-12 VITALS — BP 112/70 | HR 78 | Ht 65.0 in | Wt 124.0 lb

## 2020-09-12 DIAGNOSIS — F908 Attention-deficit hyperactivity disorder, other type: Secondary | ICD-10-CM

## 2020-09-12 DIAGNOSIS — R000 Tachycardia, unspecified: Secondary | ICD-10-CM

## 2020-09-12 NOTE — Telephone Encounter (Signed)
The pt was contacted and she states that she is doing better. She has not had to use Cepacol or chloraseptic spray.  She has been advised if she does not continue to improve she will call.

## 2020-09-12 NOTE — Patient Instructions (Signed)

## 2020-09-12 NOTE — Telephone Encounter (Signed)
Thanks for update. GM 

## 2020-09-12 NOTE — Progress Notes (Signed)
Cardiology Office Note:    Date:  09/12/2020   ID:  Lindsay Giles, DOB [birth year], MRN [id number]  PCP:  Delorse Limber  [facility] Cardiologist:  Candee Furbish, MD  [facility] Electrophysiologist:  None   Referring MD: Brunetta Jeans, PA-C    History of Present Illness:    Lindsay Giles is a 44 y.o. female here for the evaluation of palpitations at the request of [name], [place].  I had seen her during hospitalization on 07/11/2020 for chest discomfort.  She was sitting at home at the office and had sudden onset chest pain centralized mid chest crying and anxious got nauseous sweating right neck and jaw.  Felt like a bubble.  Went to lay down heart rate was elevated in the 130s.  She occasionally smokes.  No early family history of CAD.  Troponin was 20 and 21.  Chest x-ray was normal.  Telemetry showed no adverse arrhythmias.  EKG showed sinus rhythm 64.  Coronary CT scan was performed on 07/12/2020 which was normal.  No evidence of CAD.  2 small pulmonary nodules were noted and encouraged follow-up.  In review of prior office note from 08/08/2020, she was initially evaluated on 07/23/2020 with complaints of ongoing acid reflux and tachycardia.  She was restarted on pantoprazole.  Labs regarding tachycardia and fatigue were obtained and unremarkable except for mild hypokalemia at 3.4.  She had a Holter monitor study.  Which showed:   1. The basic rhythm is normal sinus with an average HR of 97 (57-180) bpm 2. No atrial fibrillation or flutter 3. No high-grade heart block or pathologic pauses 4. There are rare PVC's and rare supraventricular beats without sustained arrhythmias 5.   There are periods of ectopic atrial rhythm present  Heart rates have been up as high as 140 at home.  This can be at rest or with exertion.  Prior lowest heart rate was in the 90s.  No chest pain but occasionally will feel some tightness in her chest when she is anxious when her  heart rate is up.  No syncope.  GERD.  She is also on stimulant type medication Vyvanse.  ADHD.  Extensive lab work reviewed.  TSH normal.  Sed rate 1.  LDL 146 magnesium 1.8  Overall she is feeling better with Toprol-XL 25 mg.  Her heart rate today for instance was 70 bpm.  She feels as though this is helping her quite a bit.  Past Medical History:  Diagnosis Date   Abnormal Pap smear    ADD (attention deficit disorder)    Anxiety and depression    GERD (gastroesophageal reflux disease)    Menorrhagia    Rapid heart rate     Past Surgical History:  Procedure Laterality Date   BREAST REDUCTION SURGERY     CERVICAL ABLATION     pt denies having had d&c   DILATION AND CURETTAGE OF UTERUS     pt stateds did not have d & c , but rather a carvical ablation   EXAMINATION UNDER ANESTHESIA N/A 02/17/2014   Procedure: EXAM UNDER ANESTHESIA,  ULTRASOUND OF RECTUM, INCISION AND EXCISION OF THROMBOSED CLOT ;  Surgeon: Shann Medal, MD;  Location: [facility];  Service: General;  Laterality: N/A;   KNEE SURGERY     right hand surgery     neuroma & groth on bone removed    TONSILLECTOMY AND ADENOIDECTOMY      Current Medications: Current Meds  Medication Sig   clonazePAM (KLONOPIN) 0.5 MG tablet Take 0.5 mg by mouth 2 (two) times daily as needed for anxiety.   Cyanocobalamin (VITAMIN B-12 PO) Take 1 capsule by mouth daily.   desvenlafaxine (PRISTIQ) 100 MG 24 hr tablet Take 100 mg by mouth daily.    dexlansoprazole (DEXILANT) 60 MG capsule Take 1 capsule (60 mg total) by mouth daily.   dicyclomine (BENTYL) 10 MG capsule Take 1 capsule (10 mg total) by mouth 3 (three) times daily as needed for spasms.   Famotidine (PEPCID PO) Take by mouth.   Lactobacillus (PROBIOTIC ACIDOPHILUS PO) Take 1 capsule by mouth daily.    lisdexamfetamine (VYVANSE) 40 MG capsule Take 1 capsule (40 mg total) by mouth every morning.   metoprolol succinate (TOPROL-XL) 25 MG 24 hr tablet Take 1  tablet (25 mg total) by mouth daily.   Omega-3 Fatty Acids (FISH OIL PO) Take 1 capsule by mouth daily.   ondansetron (ZOFRAN) 8 MG tablet Take 1 tablet (8 mg total) by mouth daily as needed for nausea.   traZODone (DESYREL) 100 MG tablet Take 200 mg by mouth at bedtime.    VITAMIN D PO Take 1 capsule by mouth daily.    Vitamin D, Ergocalciferol, (DRISDOL) 1.25 MG (50000 UNIT) CAPS capsule Take 50,000 Units by mouth once a week.     Allergies:   Atrovent [ipratropium]   Social History   Socioeconomic History   Marital status: Married    Spouse name: Not on file   Number of children: Not on file   Years of education: Not on file   Highest education level: Not on file  Occupational History   Not on file  Tobacco Use   Smoking status: Former Smoker    Types: Cigarettes   Smokeless tobacco: Never Used  Scientific laboratory technician Use: Never used  Substance and Sexual Activity   Alcohol use: Not Currently    Alcohol/week: 7.0 standard drinks    Types: 6 Shots of liquor, 1 Standard drinks or equivalent per week   Drug use: No   Sexual activity: Yes    Partners: Male    Birth control/protection: Surgical    Comment: vas   Other Topics Concern   Not on file  Social History Narrative   Daughter of Dr. 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[employer] [place] Resource Strain:    Difficulty of Paying Living Expenses: Not on file  Food Insecurity:    Worried About Charity fundraiser in the Last Year: Not on file   [employer] of Food in the Last Year: Not on file  Transportation Needs:    Lack of Transportation (Medical): Not on file   Lack of Transportation (Non-Medical): Not on file  Physical Activity:    Days of Exercise per Week: Not on file   Minutes of Exercise per Session: Not on file  Stress:    Feeling of Stress : Not on file  Social Connections:    Frequency of Communication  with Friends and Family: Not on file   Frequency of Social Gatherings with Friends and Family: Not on file   Attends Religious Services: Not on file   Active Member of Clubs or Organizations: Not on file   Attends Archivist Meetings: Not on file   Marital Status: Not on file     Family History: The patient's family history includes Angina in her  paternal grandmother; Arrhythmia in her paternal grandmother; Breast cancer in her paternal grandmother; Cancer (age of onset: 104) in her paternal grandmother; Hyperlipidemia in her paternal grandmother; Hypothyroidism in her mother; Stroke in her paternal grandmother. There is no history of Colon cancer, Colon polyps, Esophageal cancer, Stomach cancer, or Rectal cancer.  ROS:   Please see the history of present illness.    All other systems reviewed and are negative.  EKGs/Labs/Other Studies Reviewed:    The following studies were reviewed today:  ECHO 2021:   1. Left ventricular ejection fraction, by estimation, is 60 to 65%. The  left ventricle has normal function. The left ventricle has no regional  wall motion abnormalities. Left ventricular diastolic parameters were  normal.  2. Right ventricular systolic function is normal. The right ventricular  size is normal. Tricuspid regurgitation signal is inadequate for assessing  PA pressure.  3. The mitral valve is normal in structure. Trivial mitral valve  regurgitation. No evidence of mitral stenosis.  4. The aortic valve is normal in structure. Aortic valve regurgitation is  not visualized. No aortic stenosis is present.  5. The inferior vena cava is normal in size with greater than 50%  respiratory variability, suggesting right atrial pressure of 3 mmHg.    Recent Labs: 07/11/2020: Magnesium 1.8; TSH 2.490 08/21/2020: ALT 12; BUN 9; Creatinine, Ser 0.56; Hemoglobin 15.5; Platelets 296.0; Potassium 3.5; Sodium 136  Recent Lipid Panel    Component Value Date/Time    CHOL 206 (H) 07/12/2020 0537   TRIG 85 07/12/2020 0537   HDL 43 07/12/2020 0537   CHOLHDL 4.8 07/12/2020 0537   VLDL 17 07/12/2020 0537   LDLCALC 146 (H) 07/12/2020 0537       Physical Exam:    VS:  BP 112/70    Pulse 78    Ht 5\' 5"  (1.651 m)    Wt 124 lb (56.2 kg)    LMP 09/02/2020 Comment: no chance of pregnancy per pt   SpO2 96%    BMI 20.63 kg/m     Wt Readings from Last 3 Encounters:  09/12/20 124 lb (56.2 kg)  09/09/20 126 lb (57.2 kg)  09/03/20 126 lb (57.2 kg)     GEN:  Well nourished, well developed in no acute distress HEENT: Normal NECK: No JVD; No carotid bruits LYMPHATICS: No lymphadenopathy CARDIAC: RRR, no murmurs, rubs, gallops RESPIRATORY:  Clear to auscultation without rales, wheezing or rhonchi  ABDOMEN: Soft, non-tender, non-distended MUSCULOSKELETAL:  No edema; No deformity  SKIN: Warm and dry NEUROLOGIC:  Alert and oriented x 3 PSYCHIATRIC:  Normal affect   ASSESSMENT:    1. Tachycardia   2. Attention deficit hyperactivity disorder (ADHD), other type    PLAN:    In order of problems listed above:  Tachycardia GERD ADHD -Metoprolol 25 mg XL was given. -Average heart rate on monitor was 96 bpm, maximum was 180.  I personally reviewed the monitor findings with her and showed her the grass.  During sleep her heart rate does relax into the 70s.  -As was discussed previously, Vyvanse certainly as a stimulant can increase her heart rate.  Now that she is on the Toprol, she feels better and I would be okay with her continuing the Vyvanse if she needs it. -Lab work unremarkable.  She is also seeing rheumatologist soon because of positive ANA. -Echocardiogram was reassuring with no evidence of tachycardia induced cardiomyopathy. -Coronary CT previously performed because of chest discomfort was also reassuring. -He is continuing to  work on GERD.  Pantoprazole.  Medication Adjustments/Labs and Tests Ordered: Current medicines are reviewed at length  with the patient today.  Concerns regarding medicines are outlined above.  No orders of the defined types were placed in this encounter.  No orders of the defined types were placed in this encounter.   Patient Instructions  Medication Instructions:  The current medical regimen is effective;  continue present plan and medications.  *If you need a refill on your cardiac medications before your next appointment, please call your pharmacy*  Follow-Up: At [facility], you and your health needs are our priority.  As part of our continuing mission to provide you with exceptional heart care, we have created designated Provider Care Teams.  These Care Teams include your primary Cardiologist (physician) and Advanced Practice Providers (APPs -  Physician Assistants and Nurse Practitioners) who all work together to provide you with the care you need, when you need it.  We recommend signing up for the patient portal called "MyChart".  Sign up information is provided on this After Visit Summary.  MyChart is used to connect with patients for Virtual Visits (Telemedicine).  Patients are able to view lab/test results, encounter notes, upcoming appointments, etc.  Non-urgent messages can be sent to your provider as well.   To learn more about what you can do with MyChart, go to [email or web address].    Your next appointment:   6 month(s)  The format for your next appointment:   In Person  Provider:   Candee Furbish, MD   Thank you for choosing [facility]!!         Signed, Candee Furbish, MD  09/12/2020 5:10 PM    [place]

## 2020-09-16 ENCOUNTER — Encounter: Payer: Self-pay | Admitting: Gastroenterology

## 2020-09-16 NOTE — Progress Notes (Signed)
Office Visit Note  Patient: Lindsay Giles             Date of Birth: Mar 01, 1976           MRN: [id number]             PCP: Brunetta Jeans, PA-C Referring: Brunetta Jeans, PA-C Visit Date: 09/17/2020 Occupation: Hair dresser  Subjective:  New Patient (Initial Visit) and Rash (on face)   History of Present Illness: Lindsay Giles is a 44 y.o. female here for evaluation of positive ANA in the setting of skin rashes, tachycardia, joint stiffness, and fatigue.  She has had symptoms of erythematous rash over the face and front of her neck for a few years but other symptoms are dramatically increased within the past few months.  She was evaluated in the hospital in September due to development of tachycardia and atypical chest pain that was worked up with transient troponin elevation had sinus tachycardia and sounds like some PVCs with very high heart rate.  Cardiac work-up was low risk for ischemic disease there is also question of chest pain attributable to GERD.  She was started on metoprolol that is apparently improved her tachycardia significantly.  She also has issues with pelvic pain seeing OB/GYN for this.  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  She has not noticed eye inflammation, oral ulcers, lymphadenopathy, or Raynaud's phenomenon.  She has 2 children with no miscarriages or severe pregnancy complications no history of blood clots.  Labs reviewed 07/2020 ANA 1:160 nuclear, speckled hsCRP 1.6 B-12 957 Folate 4.6 IgA 268 CBC 12.4 WBCs 77.1% neutrophils, Hgb 15.5 CMP normal CRP <1.0 TTG IgA negative  Pathology reviewed 08/2020 Endoscopy showing mild chronic gastritis no malignancy no small bowel disease 07/2020 Cervical cytology normal    Activities of Daily Living:  Patient reports morning stiffness for 1-6 hours.   Patient Reports nocturnal pain.  Difficulty dressing/grooming: Denies Difficulty  climbing stairs: Reports Difficulty getting out of chair: Reports Difficulty using hands for taps, buttons, cutlery, and/or writing: Denies  Review of Systems  Constitutional: Positive for fatigue.  HENT: Positive for mouth dryness and nose dryness. Negative for mouth sores.   Eyes: Positive for visual disturbance and dryness. Negative for pain and itching.  Respiratory: Positive for cough and shortness of breath. Negative for hemoptysis and difficulty breathing.   Cardiovascular: Positive for chest pain, palpitations and swelling in legs/feet.  Gastrointestinal: Positive for abdominal pain and constipation. Negative for blood in stool and diarrhea.  Endocrine: Positive for increased urination.  Genitourinary: Negative for painful urination.  Musculoskeletal: Positive for arthralgias, joint pain, joint swelling, myalgias, muscle weakness, morning stiffness, muscle tenderness and myalgias.  Skin: Positive for rash and redness. Negative for color change.  Allergic/Immunologic: Positive for susceptible to infections.  Neurological: Positive for dizziness, numbness, headaches, memory loss and weakness.  Hematological: Negative for swollen glands.  Psychiatric/Behavioral: Positive for confusion. Negative for sleep disturbance.    PMFS History:  Patient Active Problem List   Diagnosis Date Noted  . Facial rash 09/17/2020  . Positive ANA (antinuclear antibody) 09/17/2020  . Livedo reticularis without ulceration 09/17/2020  . Paresthesias 09/17/2020  . Elevated LDL cholesterol level 07/12/2020  . ADHD 07/11/2020  . Chest pain 07/10/2020  . Arthralgia 03/10/2014  . Abdominal pain 03/10/2014  . Headache 03/10/2014  . Depression 03/10/2014  . Reflux esophagitis 03/10/2014  . Rectal pain 02/15/2014  . Menorrhagia  Past Medical History:  Diagnosis Date  . Abnormal Pap smear   . ADD (attention deficit disorder)   . Anxiety and depression   . Gastritis    Per patient  . GERD  (gastroesophageal reflux disease)   . Menorrhagia   . Rapid heart rate     Family History  Problem Relation Age of Onset  . Hypothyroidism Mother   . Breast cancer Paternal Grandmother   . Cancer Paternal Grandmother 33       breast  . Arrhythmia Paternal Grandmother   . Hyperlipidemia Paternal Grandmother   . Angina Paternal Grandmother   . Stroke Paternal Grandmother   . Diabetes Son   . Colon cancer Neg Hx   . Colon polyps Neg Hx   . Esophageal cancer Neg Hx   . Stomach cancer Neg Hx   . Rectal cancer Neg Hx    Past Surgical History:  Procedure Laterality Date  . BREAST REDUCTION SURGERY    . CERVICAL ABLATION     pt denies having had d&c  . DILATION AND CURETTAGE OF UTERUS     pt stateds did not have d & c , but rather a carvical ablation  . EXAMINATION UNDER ANESTHESIA N/A 02/17/2014   Procedure: EXAM UNDER ANESTHESIA,  ULTRASOUND OF RECTUM, INCISION AND EXCISION OF THROMBOSED CLOT ;  Surgeon: Shann Medal, MD;  Location: [facility];  Service: General;  Laterality: N/A;  . KNEE SURGERY    . right hand surgery     neuroma & groth on bone removed   . TONSILLECTOMY AND ADENOIDECTOMY     Social History   Social History Narrative   Daughter of Dr. 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 Influenza Split 09/07/2012  . Influenza,inj,Quad PF,6+ Mos 10/19/2016  . Influenza,inj,quad, With Preservative 07/26/2019     Objective: Vital Signs: BP 119/71 (BP Location: Left Arm, Patient Position: Sitting, Cuff Size: Small)   Pulse 85   Ht 5\' 5"  (1.651 m)   Wt 125 lb 6.4 oz (56.9 kg)   LMP 09/02/2020   BMI 20.87 kg/m    Physical Exam HENT:     Head: Normocephalic.     Comments: Erythematous facial rash without scaling No sparing of nasolabial folds    Ears:     Comments: Erythema of ear helix b/l without warmth    Nose: Nose normal.     Mouth/Throat:     Mouth:  Mucous membranes are moist.     Pharynx: Oropharynx is clear.     Comments: Dark coloration on top of tongue Eyes:     Conjunctiva/sclera: Conjunctivae normal.  Cardiovascular:     Rate and Rhythm: Normal rate and regular rhythm.  Pulmonary:     Effort: Pulmonary effort is normal.     Breath sounds: Normal breath sounds.  Skin:    Comments: Cool and dry Livedo reticularis over distal arms and bilateral legs up to thigh on inner half No nailfold capillary changes seen  Neurological:     General: No focal deficit present.     Mental Status: She is alert.  Psychiatric:        Mood and Affect: Mood normal.     Musculoskeletal Exam:  Neck full range of motion no tenderness Shoulder, elbow, wrist, fingers full range of motion no tenderness or swelling Normal hip internal and external rotation without pain Knees, ankles, MTPs full range of motion  no tenderness or swelling Right knee patellofemoral crepitus present Tenderness to pressure over left distal shin without radiation   Investigation: No additional findings.  Imaging: CT Abdomen Pelvis W Contrast  Result Date: 08/27/2020 CLINICAL DATA:  Weight loss and abdominal pain.  Chest pain. EXAM: CT ABDOMEN AND PELVIS WITH CONTRAST TECHNIQUE: Multidetector CT imaging of the abdomen and pelvis was performed using the standard protocol following bolus administration of intravenous contrast. CONTRAST:  149mL OMNIPAQUE IOHEXOL 300 MG/ML  SOLN COMPARISON:  01/19/2018 and overlapping portions of cardiac CT from 07/12/2020 FINDINGS: Lower chest: The lower margins of bilateral breast implants are included. Mild distal esophageal wall thickening, esophagitis would be a common cause. Hepatobiliary: Unremarkable Pancreas: Unremarkable Spleen: Unremarkable Adrenals/Urinary Tract: Unremarkable Stomach/Bowel: Prominent stool throughout the colon favors constipation. Normal appendix. Vascular/Lymphatic: Unremarkable Reproductive: Heterogeneity in the  anterior uterine myometrium probably from degenerated fibroids. Cystic lesion in the left ovary 3.3 by 2.8 by 2.6 cm, formerly 1.5 by 1.6 by 1.9 cm. Based on size and current guidelines, this does not require further imaging. There is an adjacent rim enhancing cystic lesion in the left ovary likely a corpus luteum, measuring 1.2 by 0.7 by 1.7 cm. Other: No supplemental non-categorized findings. Musculoskeletal: Mild posterior intervertebral spurring at the T11-12 level without overt impingement. IMPRESSION: 1. Prominent stool throughout the colon favors constipation. 2. Mild distal esophageal wall thickening, esophagitis would be a common cause. 3. Heterogeneity in the anterior uterine myometrium probably from degenerated fibroids. 4. Cystic lesion in the left ovary has enlarged compared to the prior exam, but is still under 5 cm in diameter and accordingly does not require further workup based on current guidelines. 5. Mild posterior intervertebral spurring at the T11-12 level without overt impingement. Electronically Signed   By: Van Clines M.D.   On: 08/27/2020 12:43    Recent Labs: Lab Results  Component Value Date   WBC 12.4 (H) 08/21/2020   HGB 15.5 (H) 08/21/2020   PLT 296.0 08/21/2020   NA 136 08/21/2020   K 3.5 08/21/2020   CL 100 08/21/2020   CO2 28 08/21/2020   GLUCOSE 90 08/21/2020   BUN 9 08/21/2020   CREATININE 0.56 08/21/2020   BILITOT 0.4 08/21/2020   ALKPHOS 58 08/21/2020   AST 15 08/21/2020   ALT 12 08/21/2020   PROT 7.6 08/21/2020   ALBUMIN 4.9 08/21/2020   CALCIUM 9.7 08/21/2020   GFRAA >60 07/10/2020    Speciality Comments: No specialty comments available.  Procedures:  No procedures performed Allergies: Atrovent [ipratropium]   Assessment / Plan:     Visit Diagnoses: Positive ANA (antinuclear antibody)  Facial rash - Plan: Anti-scleroderma antibody, RNP Antibody, Anti-Smith antibody, Sjogrens syndrome-A extractable nuclear antibody, Sjogrens syndrome-B  extractable nuclear antibody, Anti-DNA antibody, double-stranded, C3 and C4, Sedimentation rate  Facial rash seen on exam today seems more consistent with rosacea versus malar rash with very flat appearance and no sparing of nasolabial folds.  However considering positive ANA and overall presentation would not exclude lupus or an undifferentiated connective tissue disease at this time.  We will check specific ENA's as well as sedimentation rate and complement levels for evidence of disease activity.  Plan to follow-up to repeat examination in a few weeks also after reviewing serologic work-up.  Livedo reticularis without ulceration - Plan: Cardiolipin antibodies, IgG, IgM, IgA, Beta-2 glycoprotein antibodies, Lupus Anticoagulant Eval w/Reflex  She has fairly extensive livedo reticularis rash on the upper and lower extremities with no ulcers seen.  This is not  an uncommon finding for premenopausal women especially with sympathomimetic use for ADD.  However can be seen associated lupus or antiphospholipid antibody syndrome testing for these antibodies today.  Paresthesias  Orders: Orders Placed This Encounter  Procedures  . Anti-scleroderma antibody  . RNP Antibody  . Anti-Smith antibody  . Sjogrens syndrome-A extractable nuclear antibody  . Sjogrens syndrome-B extractable nuclear antibody  . Anti-DNA antibody, double-stranded  . C3 and C4  . Cardiolipin antibodies, IgG, IgM, IgA  . Beta-2 glycoprotein antibodies  . Lupus Anticoagulant Eval w/Reflex  . Sedimentation rate   No orders of the defined types were placed in this encounter.   Follow-Up Instructions: Return in about 2 weeks (around 10/01/2020).   Collier Salina, MD  Note - This record has been created using [employer].  Chart creation errors have been sought, but may not always  have been located. Such creation errors do not reflect on  the standard of medical care.

## 2020-09-17 ENCOUNTER — Ambulatory Visit (INDEPENDENT_AMBULATORY_CARE_PROVIDER_SITE_OTHER): Payer: 59 | Admitting: Internal Medicine

## 2020-09-17 ENCOUNTER — Other Ambulatory Visit: Payer: Self-pay

## 2020-09-17 ENCOUNTER — Ambulatory Visit (INDEPENDENT_AMBULATORY_CARE_PROVIDER_SITE_OTHER): Payer: 59 | Admitting: Psychology

## 2020-09-17 ENCOUNTER — Encounter: Payer: Self-pay | Admitting: Internal Medicine

## 2020-09-17 VITALS — BP 119/71 | HR 85 | Ht 65.0 in | Wt 125.4 lb

## 2020-09-17 DIAGNOSIS — F411 Generalized anxiety disorder: Secondary | ICD-10-CM | POA: Diagnosis not present

## 2020-09-17 DIAGNOSIS — R768 Other specified abnormal immunological findings in serum: Secondary | ICD-10-CM

## 2020-09-17 DIAGNOSIS — R21 Rash and other nonspecific skin eruption: Secondary | ICD-10-CM

## 2020-09-17 DIAGNOSIS — R231 Pallor: Secondary | ICD-10-CM

## 2020-09-17 DIAGNOSIS — R202 Paresthesia of skin: Secondary | ICD-10-CM

## 2020-09-17 DIAGNOSIS — R7689 Other specified abnormal immunological findings in serum: Secondary | ICD-10-CM

## 2020-09-17 DIAGNOSIS — L719 Rosacea, unspecified: Secondary | ICD-10-CM | POA: Insufficient documentation

## 2020-09-20 LAB — SEDIMENTATION RATE: Sed Rate: 2 mm/h (ref 0–20)

## 2020-09-20 LAB — SJOGRENS SYNDROME-A EXTRACTABLE NUCLEAR ANTIBODY: SSA (Ro) (ENA) Antibody, IgG: <1 AI

## 2020-09-20 LAB — BETA-2 GLYCOPROTEIN ANTIBODIES
Beta-2 Glyco 1 IgA: <2 U/mL
Beta-2 Glyco 1 IgM: <2 U/mL
Beta-2 Glyco I IgG: <2 U/mL

## 2020-09-20 LAB — ANTI-SCLERODERMA ANTIBODY: Scleroderma (Scl-70) (ENA) Antibody, IgG: <1 AI

## 2020-09-20 LAB — ANTI-SMITH ANTIBODY: ENA SM Ab Ser-aCnc: <1 AI

## 2020-09-20 LAB — LUPUS ANTICOAGULANT EVAL W/ REFLEX
PTT-LA Screen: 36 s (ref <=40)
dRVVT: 32 s (ref <=45)

## 2020-09-20 LAB — C3 AND C4
C3 Complement: 103 mg/dL (ref 83–193)
C4 Complement: 24 mg/dL (ref 15–57)

## 2020-09-20 LAB — CARDIOLIPIN ANTIBODIES, IGG, IGM, IGA
Anticardiolipin IgA: 2 APL-U/mL
Anticardiolipin IgG: <2 GPL-U/mL
Anticardiolipin IgM: <2 MPL-U/mL

## 2020-09-20 LAB — SJOGRENS SYNDROME-B EXTRACTABLE NUCLEAR ANTIBODY: SSB (La) (ENA) Antibody, IgG: <1 AI

## 2020-09-20 LAB — RNP ANTIBODY: Ribonucleic Protein(ENA) Antibody, IgG: <1 AI

## 2020-09-20 LAB — ANTI-DNA ANTIBODY, DOUBLE-STRANDED: ds DNA Ab: <1 IU/mL

## 2020-09-22 ENCOUNTER — Ambulatory Visit (INDEPENDENT_AMBULATORY_CARE_PROVIDER_SITE_OTHER): Payer: 59 | Admitting: Psychology

## 2020-09-22 DIAGNOSIS — F331 Major depressive disorder, recurrent, moderate: Secondary | ICD-10-CM

## 2020-09-22 DIAGNOSIS — F909 Attention-deficit hyperactivity disorder, unspecified type: Secondary | ICD-10-CM | POA: Diagnosis not present

## 2020-09-22 NOTE — Progress Notes (Signed)
Lab results from clinic visit are all negative including specific autoimmune disease related tests including scl-70, RNP, Smith, SSA, SSB, dsDNA complements, and antiphospholipids. Sedimentation rate which is a marker of systemic inflammation was also negative. This makes me more suspicious that symptoms could be reactive rather than underlying autoimmune disease, but will plan to follow up as planned to look for any changes.

## 2020-09-29 ENCOUNTER — Other Ambulatory Visit: Payer: Self-pay | Admitting: Emergency Medicine

## 2020-09-29 DIAGNOSIS — K2100 Gastro-esophageal reflux disease with esophagitis, without bleeding: Secondary | ICD-10-CM

## 2020-09-29 MED ORDER — DEXLANSOPRAZOLE 60 MG PO CPDR: 60.0000 mg | DELAYED_RELEASE_CAPSULE | Freq: Every day | ORAL | 1 refills | Status: DC

## 2020-09-30 ENCOUNTER — Ambulatory Visit (INDEPENDENT_AMBULATORY_CARE_PROVIDER_SITE_OTHER): Payer: 59 | Admitting: Psychology

## 2020-09-30 DIAGNOSIS — F909 Attention-deficit hyperactivity disorder, unspecified type: Secondary | ICD-10-CM

## 2020-09-30 DIAGNOSIS — F331 Major depressive disorder, recurrent, moderate: Secondary | ICD-10-CM

## 2020-09-30 NOTE — Progress Notes (Signed)
Office Visit Note  Patient: Lindsay Giles             Date of Birth: August 21, 1976           MRN: [id number]             PCP: Brunetta Jeans, PA-C Referring: Brunetta Jeans, PA-C Visit Date: 10/01/2020 Occupation: Hair dresser  Subjective:   History of Present Illness: Lindsay Giles is a 44 y.o. female here for follow-up evaluation for positive ANA associated with rashes, weight loss, lymphadenopathy, livedo, arrhythmias, easy bruising, eyes and mouth dryness, and peripheral paresthesias. At her last visit multiple labs were checked including ENA panel plus inflammatory markers all negative. Symptoms remain about the same over the interval.    Review of Systems  Constitutional: Positive for fatigue.  HENT: Positive for mouth dryness. Negative for mouth sores and nose dryness.   Eyes: Positive for dryness. Negative for pain and itching.  Respiratory: Negative for shortness of breath and difficulty breathing.   Cardiovascular: Negative for chest pain and palpitations.  Gastrointestinal: Positive for constipation. Negative for blood in stool and diarrhea.  Endocrine: Positive for increased urination.  Genitourinary: Negative for difficulty urinating and painful urination.  Musculoskeletal: Positive for arthralgias, joint pain, joint swelling, myalgias, muscle weakness, morning stiffness, muscle tenderness and myalgias.  Skin: Positive for rash, hair loss and redness. Negative for color change.  Allergic/Immunologic: Positive for susceptible to infections.  Neurological: Positive for numbness and headaches. Negative for dizziness and memory loss.  Hematological: Positive for bruising/bleeding tendency.  Psychiatric/Behavioral: Positive for confusion. Negative for sleep disturbance.    PMFS History:  Patient Active Problem List   Diagnosis Date Noted  . Facial rash 09/17/2020  . Positive ANA (antinuclear antibody) 09/17/2020  . Livedo reticularis without ulceration  09/17/2020  . Paresthesias 09/17/2020  . Elevated LDL cholesterol level 07/12/2020  . ADHD 07/11/2020  . Chest pain 07/10/2020  . Arthralgia 03/10/2014  . Abdominal pain 03/10/2014  . Headache 03/10/2014  . Depression 03/10/2014  . Reflux esophagitis 03/10/2014  . Rectal pain 02/15/2014  . Menorrhagia     Past Medical History:  Diagnosis Date  . Abnormal Pap smear   . ADD (attention deficit disorder)   . Anemia   . Anxiety and depression   . Complication of anesthesia   . Dysrhythmia   . Gastritis    Per patient  . GERD (gastroesophageal reflux disease)   . Menorrhagia   . Pneumonia   . PONV (postoperative nausea and vomiting)   . Rapid heart rate   . Tachycardia     Family History  Problem Relation Age of Onset  . Hypothyroidism Mother   . Breast cancer Paternal Grandmother   . Cancer Paternal Grandmother 19       breast  . Arrhythmia Paternal Grandmother   . Hyperlipidemia Paternal Grandmother   . Angina Paternal Grandmother   . Stroke Paternal Grandmother   . Diabetes Son   . Colon cancer Neg Hx   . Colon polyps Neg Hx   . Esophageal cancer Neg Hx   . Stomach cancer Neg Hx   . Rectal cancer Neg Hx    Past Surgical History:  Procedure Laterality Date  . BREAST REDUCTION SURGERY    . CERVICAL ABLATION     pt denies having had d&c  . DILATION AND CURETTAGE OF UTERUS     pt stateds did not have d & c , but rather a carvical ablation  .  EXAMINATION UNDER ANESTHESIA N/A 02/17/2014   Procedure: EXAM UNDER ANESTHESIA,  ULTRASOUND OF RECTUM, INCISION AND EXCISION OF THROMBOSED CLOT ;  Surgeon: Shann Medal, MD;  Location: [facility];  Service: General;  Laterality: N/A;  . KNEE SURGERY    . right hand surgery     neuroma & groth on bone removed   . TONSILLECTOMY AND ADENOIDECTOMY     Social History   Social History Narrative   Daughter of Dr. 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 Influenza Split 09/07/2012  . Influenza,inj,Quad PF,6+ Mos 10/19/2016  . Influenza,inj,quad, With Preservative 07/26/2019     Objective: Vital Signs: BP 121/82 (BP Location: Left Arm, Patient Position: Sitting, Cuff Size: Normal)   Pulse 85   Resp 13   Ht 5\' 6"  (1.676 m)   Wt 130 lb 6.4 oz (59.1 kg)   LMP 09/07/2020   BMI 21.05 kg/m    Physical Exam HENT:     Right Ear: External ear normal.     Left Ear: External ear normal.     Mouth/Throat:     Mouth: Mucous membranes are moist.     Pharynx: Oropharynx is clear.  Eyes:     Conjunctiva/sclera: Conjunctivae normal.  Cardiovascular:     Rate and Rhythm: Normal rate and regular rhythm.  Pulmonary:     Effort: Pulmonary effort is normal.     Breath sounds: Normal breath sounds.  Skin:    General: Skin is warm and dry.     Comments: Livedo reticularis on distal upper extremities bilaterally Erythema covering center of face and upper chest with few telangiectasias present Nailfold capillaroscopy appears normal Multiple bruises of varying age present on bilateral lower extremities  Neurological:     Mental Status: She is alert.    Multiple firm mobile palpable cervical lymph nodes without tenderness all seem to be 0.5 mm diameter or less.  Musculoskeletal Exam:  Neck full range of motion no tenderness Shoulder, elbow, wrist, fingers full range of motion no tenderness or swelling Knees full range of motion, right knee patellofemoral crepitus present  Investigation: No additional findings.  Imaging: No results found.  Recent Labs: Lab Results  Component Value Date   WBC 10.7 (H) 10/01/2020   HGB 14.4 10/01/2020   PLT 278 10/01/2020   NA 135 10/01/2020   K 3.8 10/01/2020   CL 102 10/01/2020   CO2 23 10/01/2020   GLUCOSE 98 10/01/2020   BUN 13 10/01/2020   CREATININE 0.59 10/01/2020   BILITOT 0.4 08/21/2020   ALKPHOS 58 08/21/2020   AST 15 08/21/2020   ALT 12 08/21/2020    PROT 7.6 08/21/2020   ALBUMIN 4.9 08/21/2020   CALCIUM 9.0 10/01/2020   GFRAA >60 07/10/2020    Speciality Comments: No specialty comments available.  Procedures:  No procedures performed Allergies: Atrovent [ipratropium] and Morphine and related   Assessment / Plan:     Visit Diagnoses: Positive ANA (antinuclear antibody)  She does not present clinical criteria for systemic lupus and negative ENA panel symptoms do not seem to be from underlying autoimmune connective tissue disease. Her facial rash is not typical for systemic lupus. At this time I am not sure of a unifying explanation for facial rash, peripheral paresthesias, easy bruising and excessive uterine bleeding, lymphadenopathy and unexplained weight loss. She has upcoming plan for hysterectomy later this month. No additional treatment or work-up at this time  I think we can follow-up in a few months. If her lymphadenopathy and weight are the same or worsening would probably refer for hematology evaluation possible biopsy at that time. I also recommended she could return as needed if there were focal symptom changes to reassess.   Orders: No orders of the defined types were placed in this encounter.  No orders of the defined types were placed in this encounter.   Follow-Up Instructions: Return in about 10 weeks (around 12/10/2020) for Lab recheck.   Collier Salina, MD  Note - This record has been created using [employer].  Chart creation errors have been sought, but may not always  have been located. Such creation errors do not reflect on  the standard of medical care.

## 2020-09-30 NOTE — Patient Instructions (Addendum)
DUE TO COVID-19 ONLY ONE VISITOR IS ALLOWED IN WAITING ROOM (VISITOR WILL HAVE A TEMPERATURE CHECK ON ARRIVAL AND MUST WEAR A FACE MASK THE ENTIRE TIME.)  ONCE YOU ARE ADMITTED TO YOUR PRIVATE ROOM, THE SAME ONE VISITOR IS ALLOWED TO VISIT DURING VISITING HOURS ONLY.  Your COVID swab testing is scheduled for: 10/09/20  at 2:45 am  , You must self quarantine after your testing per handout given to you at the testing site. [address] [place], [place] [address]  (Must self quarantine after testing. Follow instructions on handout.)       Your procedure is scheduled on: 10/13/20  Report to [facility] AT: 6:45 A. M.   Call this number if you have problems the morning of surgery:[phone].   OUR ADDRESS IS [address].  WE ARE LOCATED IN THE [facility].                                     REMEMBER:   DO NOT EAT FOOD OR DRINK LIQUIDS AFTER MIDNIGHT .    BRUSH YOUR TEETH THE MORNING OF SURGERY.  TAKE THESE MEDICATIONS MORNING OF SURGERY WITH A SIP OF WATER: metoprolol,pristiq.  DO NOT WEAR JEWERLY, MAKE UP, OR NAIL POLISH.  DO NOT WEAR LOTIONS, POWDERS, PERFUMES/COLOGNE OR DEODORANT.  DO NOT SHAVE FOR 24 HOURS PRIOR TO DAY OF SURGERY.  CONTACTS, GLASSES, OR DENTURES MAY NOT BE WORN TO SURGERY.                                    [facility] IS NOT RESPONSIBLE  FOR ANY BELONGINGS.           YOU MAY BRING A SMALL OVERNIGHT BAG                                                           Remember:   DRINK 2 PRESURGERY ENSURE DRINKS THE NIGHT BEFORE SURGERY AT  1000 PM AND 1 PRESURGERY DRINK THE DAY OF THE PROCEDURE 3 HOURS PRIOR TO SCHEDULED SURGERY. NO SOLIDS AFTER MIDNIGHT THE DAY PRIOR TO THE SURGERY. NOTHING BY MOUTH EXCEPT CLEAR LIQUIDS UNTIL THREE HOURS PRIOR TO SCHEDULED SURGERY( 5:45 AM). PLEASE FINISH PRESURGERY ENSURE DRINK PER SURGEON ORDER 3 HOURS PRIOR TO SCHEDULED SURGERY TIME WHICH NEEDS TO BE COMPLETED  AT: 5:45 AM.  CLEAR LIQUID DIET   Foods Allowed                                                                     Foods Excluded  Coffee and tea, regular and decaf  liquids that you cannot  Plain Jell-O any favor except red or purple                                           see through such as: Fruit ices (not with fruit pulp)                                     milk, soups, orange juice  Iced Popsicles                                    All solid food Carbonated beverages, regular and diet                                    Cranberry, grape and apple juices Sports drinks like Gatorade Lightly seasoned clear broth or consume(fat free) Sugar, honey syrup  Sample Menu Breakfast                                Lunch                                     Supper Cranberry juice                    Beef broth                            Chicken broth Jell-O                                     Grape juice                           Apple juice Coffee or tea                        Jell-O                                      Popsicle                                                Coffee or tea                        Coffee or tea  _____________________________________________________________________   BRUSH YOUR TEETH MORNING OF SURGERY AND RINSE YOUR MOUTH OUT, NO CHEWING GUM CANDY OR MINTS.   Take these medicines the morning of surgery with A SIP OF WATER:metoprolol,pristiq.  You may not have any metal on your body including hair pins and              piercings  Do not wear jewelry, make-up, lotions, powders or perfumes, deodorant             Do not wear nail polish on your fingernails.  Do not shave  48 hours prior to surgery.         Do not bring valuables to the hospital. [place].  Contacts, dentures or bridgework may not be worn into surgery.  Leave suitcase in the car. After  surgery it may be brought to your room.     Patients discharged the day of surgery will not be allowed to drive home. IF YOU ARE HAVING SURGERY AND GOING HOME THE SAME DAY, YOU MUST HAVE AN ADULT TO DRIVE YOU HOME AND BE WITH YOU FOR 24 HOURS. YOU MAY GO HOME BY TAXI OR UBER OR ORTHERWISE, BUT AN ADULT MUST ACCOMPANY YOU HOME AND STAY WITH YOU FOR 24 HOURS.  Name and phone number of your driver:  Special Instructions: N/A              Please read over the following fact sheets you were given: ____________________________________________________________________        [facility] - Preparing for Surgery Before surgery, you can play an important role.  Because skin is not sterile, your skin needs to be as free of germs as possible.  You can reduce the number of germs on your skin by washing with CHG (chlorahexidine gluconate) soap before surgery.  CHG is an antiseptic cleaner which kills germs and bonds with the skin to continue killing germs even after washing. Please DO NOT use if you have an allergy to CHG or antibacterial soaps.  If your skin becomes reddened/irritated stop using the CHG and inform your nurse when you arrive at Short Stay. Do not shave (including legs and underarms) for at least 48 hours prior to the first CHG shower.  You may shave your face/neck. Please follow these instructions carefully:  1.  Shower with CHG Soap the night before surgery and the  morning of Surgery.  2.  If you choose to wash your hair, wash your hair first as usual with your  normal  shampoo.  3.  After you shampoo, rinse your hair and body thoroughly to remove the  shampoo.                           4.  Use CHG as you would any other liquid soap.  You can apply chg directly  to the skin and wash                       Gently with a scrungie or clean washcloth.  5.  Apply the CHG Soap to your body ONLY FROM THE NECK DOWN.   Do not use on face/ open                           Wound or open sores. Avoid contact  with eyes, ears mouth and genitals (private parts).                       Wash face,  Genitals (private parts) with your normal soap.             6.  Wash thoroughly, paying special attention to the area where your surgery  will be performed.  7.  Thoroughly rinse your body with warm water from the neck down.  8.  DO NOT shower/wash with your normal soap after using and rinsing off  the CHG Soap.                9.  Pat yourself dry with a clean towel.            10.  Wear clean pajamas.            11.  Place clean sheets on your bed the night of your first shower and do not  sleep with pets. Day of Surgery : Do not apply any lotions/deodorants the morning of surgery.  Please wear clean clothes to the hospital/surgery center.  FAILURE TO FOLLOW THESE INSTRUCTIONS MAY RESULT IN THE CANCELLATION OF YOUR SURGERY PATIENT SIGNATURE_________________________________  NURSE SIGNATURE__________________________________  ________________________________________________________________________

## 2020-10-01 ENCOUNTER — Ambulatory Visit (INDEPENDENT_AMBULATORY_CARE_PROVIDER_SITE_OTHER): Payer: 59 | Admitting: Internal Medicine

## 2020-10-01 ENCOUNTER — Encounter (HOSPITAL_COMMUNITY)
Admission: RE | Admit: 2020-10-01 | Discharge: 2020-10-01 | Disposition: A | Discharge status: Home / Self Care | Payer: 59 | Source: Ambulatory Visit | Attending: Obstetrics and Gynecology | Admitting: Obstetrics and Gynecology

## 2020-10-01 ENCOUNTER — Encounter: Payer: Self-pay | Admitting: Internal Medicine

## 2020-10-01 ENCOUNTER — Encounter (HOSPITAL_COMMUNITY): Payer: Self-pay

## 2020-10-01 ENCOUNTER — Other Ambulatory Visit: Payer: Self-pay

## 2020-10-01 VITALS — BP 121/82 | HR 85 | Resp 13 | Ht 66.0 in | Wt 130.4 lb

## 2020-10-01 DIAGNOSIS — Z01812 Encounter for preprocedural laboratory examination: Secondary | ICD-10-CM | POA: Insufficient documentation

## 2020-10-01 DIAGNOSIS — R231 Pallor: Secondary | ICD-10-CM

## 2020-10-01 DIAGNOSIS — R21 Rash and other nonspecific skin eruption: Secondary | ICD-10-CM

## 2020-10-01 DIAGNOSIS — R768 Other specified abnormal immunological findings in serum: Secondary | ICD-10-CM

## 2020-10-01 HISTORY — DX: Tachycardia, unspecified: R00.0

## 2020-10-01 HISTORY — DX: Cardiac arrhythmia, unspecified: I49.9

## 2020-10-01 HISTORY — DX: Other specified postprocedural states: Z98.890

## 2020-10-01 HISTORY — DX: Anemia, unspecified: D64.9

## 2020-10-01 HISTORY — DX: Other complications of anesthesia, initial encounter: T88.59XA

## 2020-10-01 HISTORY — DX: Pneumonia, unspecified organism: J18.9

## 2020-10-01 HISTORY — DX: Nausea with vomiting, unspecified: R11.2

## 2020-10-01 LAB — BASIC METABOLIC PANEL
Anion gap: 10 (ref 5–15)
BUN: 13 mg/dL (ref 6–20)
CO2: 23 mmol/L (ref 22–32)
Calcium: 9 mg/dL (ref 8.9–10.3)
Chloride: 102 mmol/L (ref 98–111)
Creatinine, Ser: 0.59 mg/dL (ref 0.44–1.00)
GFR, Estimated: >60 mL/min (ref >60)
Glucose, Bld: 98 mg/dL (ref 70–99)
Potassium: 3.8 mmol/L (ref 3.5–5.1)
Sodium: 135 mmol/L (ref 135–145)

## 2020-10-01 LAB — CBC
HCT: 43.4 % (ref 36.0–46.0)
Hemoglobin: 14.4 g/dL (ref 12.0–15.0)
MCH: 30.7 pg (ref 26.0–34.0)
MCHC: 33.2 g/dL (ref 30.0–36.0)
MCV: 92.5 fL (ref 80.0–100.0)
Platelets: 278 10*3/uL (ref 150–400)
RBC: 4.69 MIL/uL (ref 3.87–5.11)
RDW: 13 % (ref 11.5–15.5)
WBC: 10.7 10*3/uL — ABNORMAL HIGH (ref 4.0–10.5)
nRBC: 0 % (ref 0.0–0.2)

## 2020-10-01 LAB — TYPE AND SCREEN
ABO/RH(D): A NEG
Antibody Screen: NEGATIVE

## 2020-10-01 MED ORDER — ENSURE PRE-SURGERY PO LIQD: 296.0000 mL | Freq: Once | ORAL | Status: DC

## 2020-10-01 MED ORDER — ENSURE PRE-SURGERY PO LIQD: 592.0000 mL | Freq: Once | ORAL | Status: DC

## 2020-10-01 NOTE — Progress Notes (Signed)
COVID Vaccine Completed: NO Date COVID Vaccine completed: COVID vaccine manufacturer: [place]   PCP - [name].[facility] Cardiologist - Dr. Candee Furbish. LOV: 09/12/20  Chest x-ray - 07/10/20 EKG - 07/11/20 Stress Test -  ECHO - 07/12/20 Cardiac Cath -  Pacemaker/ICD device last checked:  Sleep Study -  CPAP -   Fasting Blood Sugar -  Checks Blood Sugar _____ times a day  Blood Thinner Instructions: Aspirin Instructions: Last Dose:  Anesthesia review: Hx: HTN,tachycardia.  Patient denies shortness of breath, fever, cough and chest pain at PAT appointment   Patient verbalized understanding of instructions that were given to them at the PAT appointment. Patient was also instructed that they will need to review over the PAT instructions again at home before surgery.

## 2020-10-06 ENCOUNTER — Ambulatory Visit (INDEPENDENT_AMBULATORY_CARE_PROVIDER_SITE_OTHER): Payer: 59 | Admitting: Psychology

## 2020-10-06 DIAGNOSIS — F419 Anxiety disorder, unspecified: Secondary | ICD-10-CM | POA: Diagnosis not present

## 2020-10-06 DIAGNOSIS — F331 Major depressive disorder, recurrent, moderate: Secondary | ICD-10-CM | POA: Diagnosis not present

## 2020-10-06 IMAGING — CT CT HEART MORP W/ CTA COR W/ SCORE W/ CA W/CM &/OR W/O CM
4 of 7 series · 8 of 20 positions shown, 9 images · IV contrast (APPLIED)
Comparison: None.

Addendum:
CLINICAL DATA: 44 year old female with chest pain

EXAM:
Cardiac/Coronary  CTA
TECHNIQUE: The patient was scanned on a Phillips Force scanner.

[Series 6: best diast 74 % · axial · 0.35mm/px · z∈[+1101,+1150]mm · 2 of 372 slices shown, 3 images]
[im 124/372  vessel]
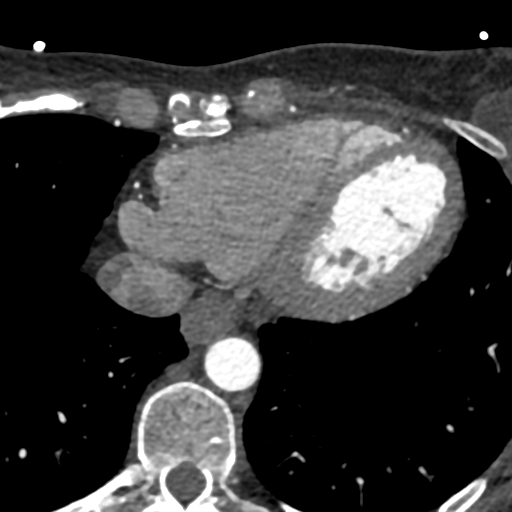
[im 124/372  lung]
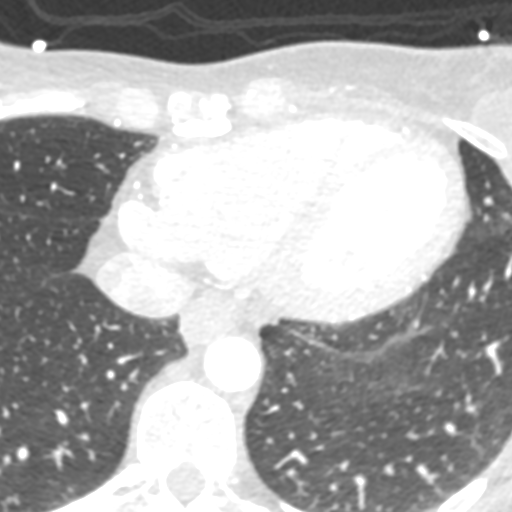
[im 248/372  vessel]
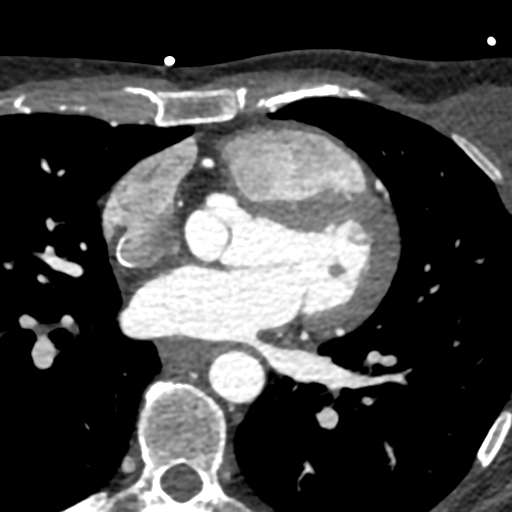

[Series 9: best syst 36 % · axial · 0.35mm/px · z∈[+1101,+1150]mm · 2 of 372 slices shown]
[im 124/372  vessel]
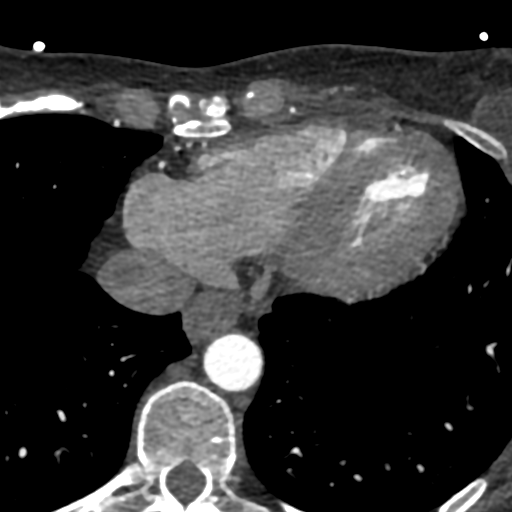
[im 248/372  vessel]
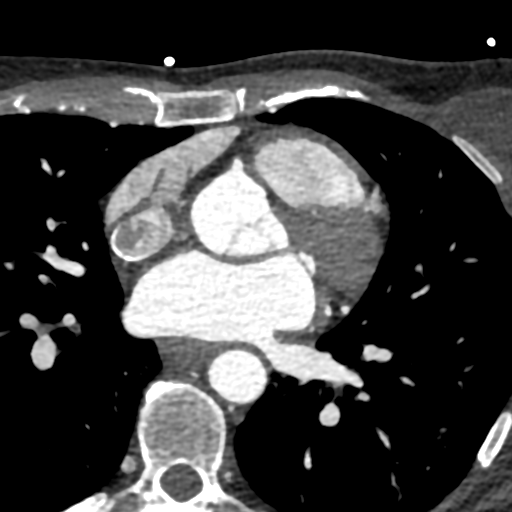

[Series 10: ts diast sharp 74 % · axial · 0.35mm/px · z∈[+1101,+1150]mm · 2 of 372 slices shown]
[im 124/372  lung]
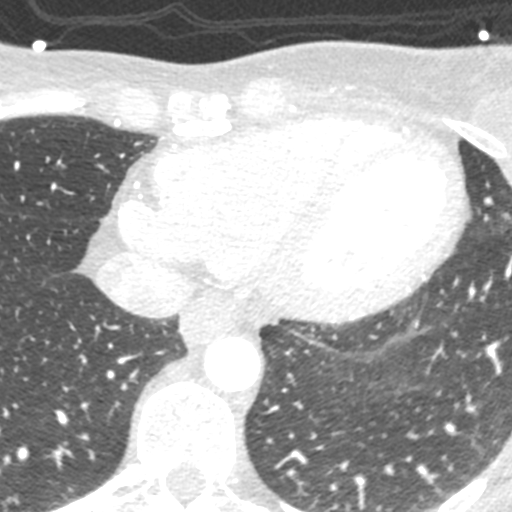
[im 248/372  lung]
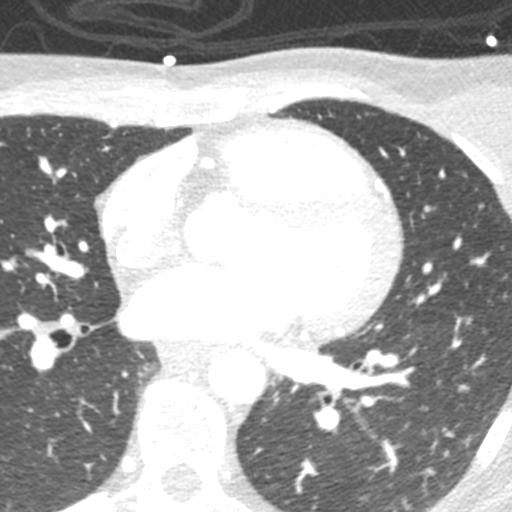

[Series 11: ts syst sharp 36 % · axial · 0.35mm/px · z∈[+1101,+1150]mm · 2 of 372 slices shown]
[im 124/372  lung]
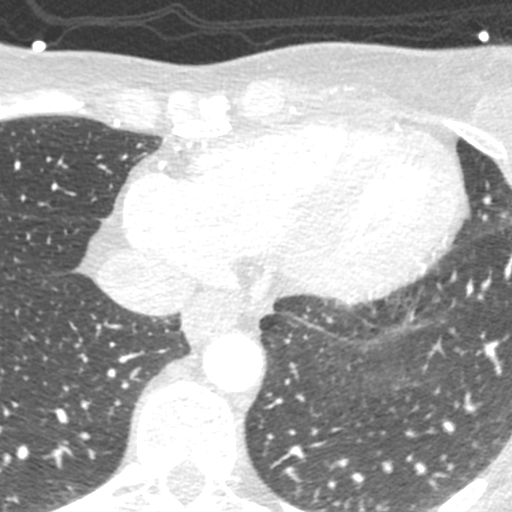
[im 248/372  lung]
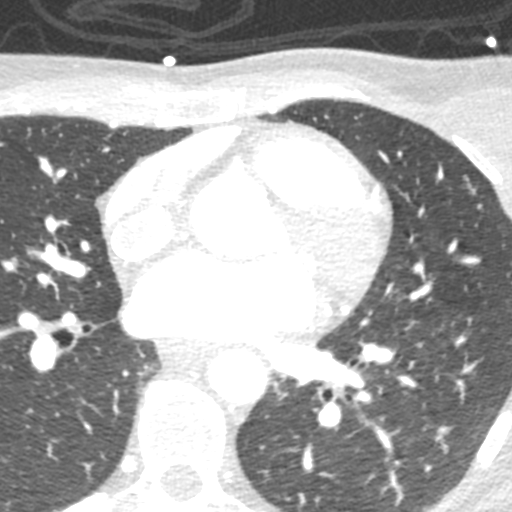

[8 of 20 positions shown; findings below may reference images not displayed]

FINDINGS: A 110 kV prospective scan was triggered in the descending thoracic
aorta at 111 HU's. Axial non-contrast 3 mm slices were carried out
through the heart. The data set was analyzed on a dedicated work
station and scored using the Agatson method. Gantry rotation speed
was 250 msecs and collimation was .6 mm. Beta blockade and 0.8 mg of
sl NTG was given. The 3D data set was reconstructed in 5% intervals
of the 67-82 % of the R-R cycle. Diastolic phases were analyzed on a
dedicated work station using MPR, MIP and VRT modes. The patient
received 80 cc of contrast.

Aorta:  Normal size.  No calcifications.  No dissection.

Aortic Valve:  Trileaflet.  No calcifications.

Coronary Arteries:  Normal coronary origin.  Right dominance.

RCA is a large dominant artery that gives rise to PDA and PLA. There
is no plaque.

Left main is a large artery that gives rise to LAD and LCX arteries.

LAD is a large vessel that has no plaque.

LCX is a non-dominant artery that gives rise to two large OM
branches. There is no plaque.

Other findings:

Normal pulmonary vein drainage into the left atrium.

Normal left atrial appendage without a thrombus.

Normal size of the pulmonary artery.

Please see radiology report for non cardiac findings.
IMPRESSION: 1. Coronary calcium score of 0. This was 0 percentile for age and
sex matched control.

2. Normal coronary origin with right dominance.

3. No evidence of CAD.

4. CAD-RADS 0. No evidence of CAD (0%). Consider non-atherosclerotic
causes of chest pain.

EXAM:
OVER-READ INTERPRETATION  CT CHEST

The following report is an over-read performed by radiologist Dr.
does not include interpretation of cardiac or coronary anatomy or
pathology. The coronary CTA interpretation by the cardiologist is
attached.
FINDINGS: Cardiovascular: Normal heart size. No significant pericardial
effusion/thickening. Great vessels are normal in course and caliber.
No central pulmonary emboli.

Mediastinum/Nodes: Unremarkable esophagus. No pathologically
enlarged mediastinal or hilar lymph nodes.

Lungs/Pleura: No pneumothorax. No pleural effusion. No acute
consolidative airspace disease or lung masses. Two tiny scattered
pulmonary nodules bilaterally, largest 2 mm anteriorly in the right
middle lobe (series 8/image 9).

Upper abdomen: No acute abnormality.

Musculoskeletal: No aggressive appearing focal osseous lesions.
Partially visualized bilateral breast prostheses. Mild thoracic
spondylosis.
IMPRESSION: Two tiny scattered pulmonary nodules bilaterally, largest 2 mm. No
follow-up needed if patient is low-risk (and has no known or
suspected primary neoplasm). Non-contrast chest CT can be considered
in 12 months if patient is high-risk. This recommendation follows
the consensus statement: Guidelines for Management of Incidental
Pulmonary Nodules Detected on CT Images: From the [HOSPITAL]

*** End of Addendum ***
FINDINGS: A 110 kV prospective scan was triggered in the descending thoracic
aorta at 111 HU's. Axial non-contrast 3 mm slices were carried out
through the heart. The data set was analyzed on a dedicated work
station and scored using the Agatson method. Gantry rotation speed
was 250 msecs and collimation was .6 mm. Beta blockade and 0.8 mg of
sl NTG was given. The 3D data set was reconstructed in 5% intervals
of the 67-82 % of the R-R cycle. Diastolic phases were analyzed on a
dedicated work station using MPR, MIP and VRT modes. The patient
received 80 cc of contrast.

Aorta:  Normal size.  No calcifications.  No dissection.

Aortic Valve:  Trileaflet.  No calcifications.

Coronary Arteries:  Normal coronary origin.  Right dominance.

RCA is a large dominant artery that gives rise to PDA and PLA. There
is no plaque.

Left main is a large artery that gives rise to LAD and LCX arteries.

LAD is a large vessel that has no plaque.

LCX is a non-dominant artery that gives rise to two large OM
branches. There is no plaque.

Other findings:

Normal pulmonary vein drainage into the left atrium.

Normal left atrial appendage without a thrombus.

Normal size of the pulmonary artery.

Please see radiology report for non cardiac findings.
IMPRESSION: 1. Coronary calcium score of 0. This was 0 percentile for age and
sex matched control.

2. Normal coronary origin with right dominance.

3. No evidence of CAD.

4. CAD-RADS 0. No evidence of CAD (0%). Consider non-atherosclerotic
causes of chest pain.

## 2020-10-08 DIAGNOSIS — F419 Anxiety disorder, unspecified: Secondary | ICD-10-CM | POA: Insufficient documentation

## 2020-10-08 DIAGNOSIS — E559 Vitamin D deficiency, unspecified: Secondary | ICD-10-CM | POA: Insufficient documentation

## 2020-10-08 NOTE — H&P (Signed)
Lindsay Giles is a 44 y.o.  female,P: 3-0-1-3 who presents for hysterectomy because of chronic pelvic pain. The patient underwent an endometrial ablation in 2018 for menorrhagia and had no complaints until this past year when she began to experience almost daily cramping that in recent weeks has been more like contractions. She would has monthly 3 day menstrual bleeding that only requires the change of a pad twice a day.  However, during this time she experiences debilitating cramping that does not radiate and is only minimally reduced with Motrin. In addition to this pain,  the experiences vomiting but no changes in bowel or bladder function. A pelvic ultrasound October 6th revealed an anteverted uterus (92.99 cc): 9.9 cm from fundus to external os: 6.29 x 5.43 x 5.20 cm, endometrium with fluid, 3.44 mm; #2 fibroids: right fundal intramural (abuts the endometrium): 3.93 cm and right anterior intramural: 0.94 cm; right ovary-2.36 cm and left ovary-2.43 cm.  An attempt was made to evacuate the endometrial fluid collection and obtain an endometrial biopsy with a paracervical block, however, adequate dilatation was not achieved.  Pathology report from the attempted biopsy did no show any endometrial tissue or malignancy.  A review of both medical and surgical management options were given to the patient however she has decided to proceed with definitve therapy in the form of hysterectomy.  Past Medical History  OB History: G: 4;  P: 3-0-1-3; SVB: 2003, 2005 and 2009  (largest infant: 7 lbs. 1 oz.  GYN History: menarche: 44 YO;  LMP: 10/01/2020;  Contraception: Vasectomy;   Denies history of abnormal PAP smear.  Last PAP smear: 2019-normal with negative HPV  Medical History: Anxiety, Depression, GERD, Vitamin D Deficiency, Cardiac Arrhythmia, Insomnia and ADHD  Surgical History: 1999 Tonsillectomy/Adenoidectomy; 2000 Right Hand Surgery;  2010 Breast Reduction and Placement of Implants; 2010 Right Knee  Surgery; 2015 Evacuation of Perineal Hematoma;  and 2018 Endometrial Ablation Has post-operative nausea and vomiting but no history of blood transfusions  Family History: Cardiovascular Disease, Hypertension, Stroke, Thryoid Disease, Diabetes Mellitus and Renal Disease  Social History: Married and employed as a Haematologist;  Denies tobacco use and occasionally uses alcohol.   Medications: Desvenlafaxine Succinate  ER100 mg daily Vyvanse 40 mg daily Trazodone 100 mg qhs Clonazepam 0.5 mg bid Dexilant 60 mg daily prn Vitamin D 50,000 IU weekly Metoprolol Succinate ER 25 mg daily Ondansetron 4 mg bid prn   Allergies  Allergen Reactions   Atrovent [Ipratropium] Other (See Comments)    Uncontrollable nosebleeds (nasal spray)   Morphine And Related Itching and Other (See Comments)    Ineffective/itching    Denies sensitivity to peanuts, shellfish, soy, latex or adhesives.   ROS: Admits to constipation, right knee swelling and shortness of breath with flare of GERD. She denies corrective lenses, headache, vision changes, nasal congestion, dysphagia, tinnitus, dizziness, hoarseness, cough,  chest pain,  vomiting, diarrhea, urinary frequency, urgency  dysuria, hematuria, vaginitis symptoms, easy bruising,  myalgias, arthralgias, skin rashes, unexplained weight loss and except as is mentioned in the history of present illness, patient's review of systems is otherwise negative.   Physical Exam  Bp: 100/62;    P: 97 bpm;   R: 16;    Temperature: 98.3  degrees F orally;    Weight: 130 lbs.;  Height: 5'5";   BMI: 21.6  Neck: supple without masses or thyromegaly Lungs: clear to auscultation Heart: regular rate and rhythm Abdomen: soft, non-tender and no organomegaly Pelvic:EGBUS- wnl; vagina-normal rugae; uterus-normal size-tender,  cervix without lesions or motion tenderness; adnexae-no tenderness or masses Extremities:  no clubbing, cyanosis or edema   Assesment: Chronic Pelvic Pain                       Hematometra                      S/P Endometrial Ablation   Disposition:  The patient was given an indication for her procedures, along with the risks of surgery,  to include but not limited to: reaction to anesthesia, damage to adjacent organs, infection and excessive bleeding.  The patient verbalized understanding of these risks and has consented to proceed with a Total Vaginal Hysterectomy with Bilateral Salpingectomy at [facility] on October 13, 2020 at 7:30 a.m.   CSN# [id number]   Baldo Hufnagle J. Florene Glen, PA-C  for [employer]. [place].

## 2020-10-09 ENCOUNTER — Other Ambulatory Visit (HOSPITAL_COMMUNITY)
Admission: RE | Admit: 2020-10-09 | Discharge: 2020-10-09 | Disposition: A | Discharge status: Home / Self Care | Payer: 59 | Source: Ambulatory Visit | Attending: Obstetrics and Gynecology | Admitting: Obstetrics and Gynecology

## 2020-10-09 DIAGNOSIS — Z20822 Contact with and (suspected) exposure to covid-19: Secondary | ICD-10-CM | POA: Insufficient documentation

## 2020-10-09 DIAGNOSIS — Z01812 Encounter for preprocedural laboratory examination: Secondary | ICD-10-CM | POA: Diagnosis not present

## 2020-10-09 LAB — SARS CORONAVIRUS 2 (TAT 6-24 HRS): SARS Coronavirus 2: NEGATIVE

## 2020-10-13 ENCOUNTER — Ambulatory Visit (HOSPITAL_BASED_OUTPATIENT_CLINIC_OR_DEPARTMENT_OTHER)
Admission: RE | Admit: 2020-10-13 | Discharge: 2020-10-14 | Disposition: A | Discharge status: Home / Self Care | Payer: 59 | Source: Other Acute Inpatient Hospital | Attending: Obstetrics and Gynecology | Admitting: Obstetrics and Gynecology

## 2020-10-13 ENCOUNTER — Other Ambulatory Visit: Payer: Self-pay

## 2020-10-13 ENCOUNTER — Ambulatory Visit (HOSPITAL_BASED_OUTPATIENT_CLINIC_OR_DEPARTMENT_OTHER): Payer: 59 | Admitting: Anesthesiology

## 2020-10-13 ENCOUNTER — Encounter (HOSPITAL_BASED_OUTPATIENT_CLINIC_OR_DEPARTMENT_OTHER)
Admission: RE | Disposition: A | Discharge status: Home / Self Care | Payer: Self-pay | Source: Other Acute Inpatient Hospital | Attending: Obstetrics and Gynecology

## 2020-10-13 ENCOUNTER — Encounter (HOSPITAL_BASED_OUTPATIENT_CLINIC_OR_DEPARTMENT_OTHER): Payer: Self-pay | Admitting: Obstetrics and Gynecology

## 2020-10-13 ENCOUNTER — Ambulatory Visit (HOSPITAL_BASED_OUTPATIENT_CLINIC_OR_DEPARTMENT_OTHER): Payer: 59 | Admitting: Physician Assistant

## 2020-10-13 DIAGNOSIS — Z888 Allergy status to other drugs, medicaments and biological substances status: Secondary | ICD-10-CM | POA: Insufficient documentation

## 2020-10-13 DIAGNOSIS — R102 Pelvic and perineal pain: Secondary | ICD-10-CM | POA: Diagnosis not present

## 2020-10-13 DIAGNOSIS — N857 Hematometra: Secondary | ICD-10-CM | POA: Diagnosis present

## 2020-10-13 DIAGNOSIS — G8929 Other chronic pain: Secondary | ICD-10-CM | POA: Insufficient documentation

## 2020-10-13 DIAGNOSIS — Z79899 Other long term (current) drug therapy: Secondary | ICD-10-CM | POA: Diagnosis not present

## 2020-10-13 DIAGNOSIS — Z885 Allergy status to narcotic agent status: Secondary | ICD-10-CM | POA: Diagnosis not present

## 2020-10-13 HISTORY — PX: VAGINAL HYSTERECTOMY: SHX2639

## 2020-10-13 HISTORY — PX: BILATERAL SALPINGECTOMY: SHX5743

## 2020-10-13 LAB — POCT PREGNANCY, URINE: Preg Test, Ur: NEGATIVE

## 2020-10-13 LAB — ABO/RH: ABO/RH(D): A NEG

## 2020-10-13 SURGERY — HYSTERECTOMY, VAGINAL
Anesthesia: General | Site: Vagina

## 2020-10-13 MED ORDER — LACTATED RINGERS IV SOLN: INTRAVENOUS | Status: DC

## 2020-10-13 MED ORDER — KETAMINE HCL 10 MG/ML IJ SOLN
INTRAMUSCULAR | Status: AC
  Filled 2020-10-13: qty 1

## 2020-10-13 MED ORDER — FENTANYL CITRATE (PF) 100 MCG/2ML IJ SOLN
INTRAMUSCULAR | Status: AC
  Filled 2020-10-13: qty 2

## 2020-10-13 MED ORDER — LIDOCAINE HCL 2 % IJ SOLN
INTRAMUSCULAR | Status: AC
  Filled 2020-10-13: qty 20

## 2020-10-13 MED ORDER — FENTANYL CITRATE (PF) 100 MCG/2ML IJ SOLN
100.0000 ug | INTRAMUSCULAR | Status: DC | PRN
Start: 2020-10-13 — End: 2020-10-14
  Administered 2020-10-13 – 2020-10-14 (×3): 100 ug via INTRAVENOUS

## 2020-10-13 MED ORDER — OXYCODONE HCL 5 MG PO TABS
10.0000 mg | ORAL_TABLET | ORAL | Status: DC | PRN
  Administered 2020-10-13: 5 mg via ORAL
  Administered 2020-10-13 – 2020-10-14 (×2): 10 mg via ORAL

## 2020-10-13 MED ORDER — GABAPENTIN 300 MG PO CAPS
300.0000 mg | ORAL_CAPSULE | ORAL | Status: AC
  Administered 2020-10-13: 07:00:00 300 mg via ORAL

## 2020-10-13 MED ORDER — ACETAMINOPHEN 500 MG PO TABS
ORAL_TABLET | ORAL | Status: AC
  Filled 2020-10-13: qty 2

## 2020-10-13 MED ORDER — CELECOXIB 200 MG PO CAPS
400.0000 mg | ORAL_CAPSULE | ORAL | Status: AC
  Administered 2020-10-13: 07:00:00 400 mg via ORAL

## 2020-10-13 MED ORDER — GABAPENTIN 300 MG PO CAPS
ORAL_CAPSULE | ORAL | Status: AC
  Filled 2020-10-13: qty 1

## 2020-10-13 MED ORDER — DIPHENHYDRAMINE HCL 25 MG PO CAPS: 25.0000 mg | ORAL_CAPSULE | Freq: Four times a day (QID) | ORAL | Status: DC | PRN

## 2020-10-13 MED ORDER — ROCURONIUM BROMIDE 10 MG/ML (PF) SYRINGE
PREFILLED_SYRINGE | INTRAVENOUS | Status: DC | PRN
  Administered 2020-10-13: 70 mg via INTRAVENOUS

## 2020-10-13 MED ORDER — DEXAMETHASONE SODIUM PHOSPHATE 10 MG/ML IJ SOLN
INTRAMUSCULAR | Status: DC | PRN
  Administered 2020-10-13: 10 mg via INTRAVENOUS

## 2020-10-13 MED ORDER — FENTANYL CITRATE (PF) 250 MCG/5ML IJ SOLN
INTRAMUSCULAR | Status: AC
  Filled 2020-10-13: qty 5

## 2020-10-13 MED ORDER — KETOROLAC TROMETHAMINE 30 MG/ML IJ SOLN
30.0000 mg | Freq: Four times a day (QID) | INTRAMUSCULAR | Status: AC
  Administered 2020-10-13 (×2): 30 mg via INTRAVENOUS

## 2020-10-13 MED ORDER — MEPERIDINE HCL 25 MG/ML IJ SOLN: 6.2500 mg | INTRAMUSCULAR | Status: DC | PRN

## 2020-10-13 MED ORDER — IBUPROFEN 200 MG PO TABS
600.0000 mg | ORAL_TABLET | Freq: Four times a day (QID) | ORAL | Status: DC
  Administered 2020-10-14 (×2): 600 mg via ORAL

## 2020-10-13 MED ORDER — DOCUSATE SODIUM 100 MG PO CAPS
ORAL_CAPSULE | ORAL | Status: AC
  Filled 2020-10-13: qty 1

## 2020-10-13 MED ORDER — KETOROLAC TROMETHAMINE 30 MG/ML IJ SOLN
INTRAMUSCULAR | Status: AC
  Filled 2020-10-13: qty 1

## 2020-10-13 MED ORDER — PROMETHAZINE HCL 25 MG/ML IJ SOLN: 6.2500 mg | INTRAMUSCULAR | Status: DC | PRN

## 2020-10-13 MED ORDER — ONDANSETRON HCL 4 MG/2ML IJ SOLN
INTRAMUSCULAR | Status: DC | PRN
  Administered 2020-10-13: 4 mg via INTRAVENOUS

## 2020-10-13 MED ORDER — VENLAFAXINE HCL ER 150 MG PO CP24
150.0000 mg | ORAL_CAPSULE | Freq: Every day | ORAL | Status: DC
  Administered 2020-10-13: 22:00:00 150 mg via ORAL
  Filled 2020-10-13: qty 1

## 2020-10-13 MED ORDER — HYDROMORPHONE HCL 1 MG/ML IJ SOLN
INTRAMUSCULAR | Status: AC
  Filled 2020-10-13: qty 1

## 2020-10-13 MED ORDER — EPHEDRINE SULFATE-NACL 50-0.9 MG/10ML-% IV SOSY
PREFILLED_SYRINGE | INTRAVENOUS | Status: DC | PRN
  Administered 2020-10-13: 15 mg via INTRAVENOUS
  Administered 2020-10-13 (×2): 10 mg via INTRAVENOUS

## 2020-10-13 MED ORDER — DEXAMETHASONE SODIUM PHOSPHATE 10 MG/ML IJ SOLN
INTRAMUSCULAR | Status: AC
  Filled 2020-10-13: qty 1

## 2020-10-13 MED ORDER — EPHEDRINE 5 MG/ML INJ
INTRAVENOUS | Status: AC
  Filled 2020-10-13: qty 10

## 2020-10-13 MED ORDER — MIDAZOLAM HCL 2 MG/2ML IJ SOLN
INTRAMUSCULAR | Status: AC
  Filled 2020-10-13: qty 2

## 2020-10-13 MED ORDER — MENTHOL 3 MG MT LOZG: 1.0000 | LOZENGE | OROMUCOSAL | Status: DC | PRN

## 2020-10-13 MED ORDER — PANTOPRAZOLE SODIUM 40 MG PO TBEC: 40.0000 mg | DELAYED_RELEASE_TABLET | Freq: Every day | ORAL | Status: DC

## 2020-10-13 MED ORDER — 0.9 % SODIUM CHLORIDE (POUR BTL) OPTIME
TOPICAL | Status: DC | PRN
  Administered 2020-10-13: 11:00:00 500 mL

## 2020-10-13 MED ORDER — FENTANYL CITRATE (PF) 100 MCG/2ML IJ SOLN
INTRAMUSCULAR | Status: DC | PRN
  Administered 2020-10-13 (×7): 50 ug via INTRAVENOUS

## 2020-10-13 MED ORDER — LACTATED RINGERS IV SOLN
INTRAVENOUS | Status: DC
  Administered 2020-10-13: 08:00:00 125 mL via INTRAVENOUS
  Administered 2020-10-13: 19:00:00 125 mL/h via INTRAVENOUS

## 2020-10-13 MED ORDER — FENTANYL CITRATE (PF) 100 MCG/2ML IJ SOLN
25.0000 ug | INTRAMUSCULAR | Status: AC | PRN
Start: 2020-10-13 — End: 2020-10-13
  Administered 2020-10-13 (×6): 50 ug via INTRAVENOUS

## 2020-10-13 MED ORDER — KETOROLAC TROMETHAMINE 30 MG/ML IJ SOLN: 30.0000 mg | Freq: Once | INTRAMUSCULAR | Status: AC | PRN

## 2020-10-13 MED ORDER — CEFAZOLIN SODIUM-DEXTROSE 2-4 GM/100ML-% IV SOLN
2.0000 g | INTRAVENOUS | Status: AC
  Administered 2020-10-13: 09:00:00 2 g via INTRAVENOUS

## 2020-10-13 MED ORDER — TRAZODONE HCL 100 MG PO TABS
100.0000 mg | ORAL_TABLET | Freq: Every day | ORAL | Status: DC
  Administered 2020-10-13: 23:00:00 100 mg via ORAL
  Filled 2020-10-13: qty 1

## 2020-10-13 MED ORDER — ACETAMINOPHEN 10 MG/ML IV SOLN
INTRAVENOUS | Status: AC
  Filled 2020-10-13: qty 100

## 2020-10-13 MED ORDER — SODIUM CHLORIDE (PF) 0.9 % IJ SOLN
INTRAMUSCULAR | Status: DC | PRN
  Administered 2020-10-13: 23 mL

## 2020-10-13 MED ORDER — SCOPOLAMINE 1 MG/3DAYS TD PT72
MEDICATED_PATCH | TRANSDERMAL | Status: AC
  Filled 2020-10-13: qty 1

## 2020-10-13 MED ORDER — MIDAZOLAM HCL 2 MG/2ML IJ SOLN
INTRAMUSCULAR | Status: DC | PRN
  Administered 2020-10-13: 2 mg via INTRAVENOUS

## 2020-10-13 MED ORDER — ONDANSETRON HCL 4 MG/2ML IJ SOLN: 4.0000 mg | Freq: Four times a day (QID) | INTRAMUSCULAR | Status: DC | PRN

## 2020-10-13 MED ORDER — CELECOXIB 200 MG PO CAPS
ORAL_CAPSULE | ORAL | Status: AC
  Filled 2020-10-13: qty 2

## 2020-10-13 MED ORDER — DOCUSATE SODIUM 100 MG PO CAPS
100.0000 mg | ORAL_CAPSULE | Freq: Two times a day (BID) | ORAL | Status: DC
Start: 2020-10-13 — End: 2020-10-14
  Administered 2020-10-13 (×2): 100 mg via ORAL

## 2020-10-13 MED ORDER — POVIDONE-IODINE 10 % EX SWAB
2.0000 "application " | Freq: Once | CUTANEOUS | Status: AC
  Administered 2020-10-13: 2 via TOPICAL

## 2020-10-13 MED ORDER — SUGAMMADEX SODIUM 200 MG/2ML IV SOLN
INTRAVENOUS | Status: DC | PRN
  Administered 2020-10-13: 120 mg via INTRAVENOUS

## 2020-10-13 MED ORDER — LIDOCAINE HCL (PF) 2 % IJ SOLN
INTRAMUSCULAR | Status: AC
  Filled 2020-10-13: qty 5

## 2020-10-13 MED ORDER — PROPOFOL 10 MG/ML IV BOLUS
INTRAVENOUS | Status: DC | PRN
  Administered 2020-10-13: 200 mg via INTRAVENOUS

## 2020-10-13 MED ORDER — HYDROXYZINE HCL 50 MG PO TABS
50.0000 mg | ORAL_TABLET | Freq: Once | ORAL | Status: AC
  Administered 2020-10-13: 17:00:00 50 mg via ORAL
  Filled 2020-10-13: qty 1

## 2020-10-13 MED ORDER — ROCURONIUM BROMIDE 10 MG/ML (PF) SYRINGE
PREFILLED_SYRINGE | INTRAVENOUS | Status: AC
  Filled 2020-10-13: qty 10

## 2020-10-13 MED ORDER — HYDROMORPHONE HCL 1 MG/ML IJ SOLN
0.2500 mg | INTRAMUSCULAR | Status: DC | PRN
  Administered 2020-10-13 (×3): 0.5 mg via INTRAVENOUS
  Administered 2020-10-13 (×2): 0.25 mg via INTRAVENOUS

## 2020-10-13 MED ORDER — OXYCODONE HCL 5 MG PO TABS
ORAL_TABLET | ORAL | Status: AC
  Filled 2020-10-13: qty 1

## 2020-10-13 MED ORDER — LIDOCAINE 2% (20 MG/ML) 5 ML SYRINGE
INTRAMUSCULAR | Status: DC | PRN
  Administered 2020-10-13: 1.5 mg/kg/h via INTRAVENOUS
  Administered 2020-10-13: 100 mg via INTRAVENOUS

## 2020-10-13 MED ORDER — SCOPOLAMINE 1 MG/3DAYS TD PT72
1.0000 | MEDICATED_PATCH | TRANSDERMAL | Status: DC
  Administered 2020-10-13: 07:00:00 1.5 mg via TRANSDERMAL

## 2020-10-13 MED ORDER — SIMETHICONE 80 MG PO CHEW: 80.0000 mg | CHEWABLE_TABLET | Freq: Four times a day (QID) | ORAL | Status: DC | PRN

## 2020-10-13 MED ORDER — OXYCODONE HCL 5 MG PO TABS
5.0000 mg | ORAL_TABLET | ORAL | Status: DC | PRN
  Administered 2020-10-13: 5 mg via ORAL

## 2020-10-13 MED ORDER — ACETAMINOPHEN 10 MG/ML IV SOLN
1000.0000 mg | Freq: Once | INTRAVENOUS | Status: AC
  Administered 2020-10-13: 13:00:00 1000 mg via INTRAVENOUS

## 2020-10-13 MED ORDER — CEFAZOLIN SODIUM-DEXTROSE 2-4 GM/100ML-% IV SOLN
INTRAVENOUS | Status: AC
  Filled 2020-10-13: qty 100

## 2020-10-13 MED ORDER — PROPOFOL 10 MG/ML IV BOLUS
INTRAVENOUS | Status: AC
  Filled 2020-10-13: qty 20

## 2020-10-13 MED ORDER — METOPROLOL SUCCINATE ER 25 MG PO TB24
25.0000 mg | ORAL_TABLET | Freq: Every day | ORAL | Status: DC
  Filled 2020-10-13: qty 1

## 2020-10-13 MED ORDER — OXYCODONE HCL 5 MG PO TABS
ORAL_TABLET | ORAL | Status: AC
  Filled 2020-10-13: qty 2

## 2020-10-13 MED ORDER — VASOPRESSIN 20 UNIT/ML IV SOLN
INTRAVENOUS | Status: DC | PRN
  Administered 2020-10-13: 5 [IU]

## 2020-10-13 MED ORDER — ONDANSETRON HCL 4 MG/2ML IJ SOLN
INTRAMUSCULAR | Status: AC
  Filled 2020-10-13: qty 2

## 2020-10-13 MED ORDER — ACETAMINOPHEN 500 MG PO TABS
1000.0000 mg | ORAL_TABLET | ORAL | Status: AC
  Administered 2020-10-13: 07:00:00 1000 mg via ORAL

## 2020-10-13 MED ORDER — KETAMINE HCL 10 MG/ML IJ SOLN
INTRAMUSCULAR | Status: DC | PRN
  Administered 2020-10-13: 25 mg via INTRAVENOUS

## 2020-10-13 MED ORDER — ACETAMINOPHEN 500 MG PO TABS
1000.0000 mg | ORAL_TABLET | Freq: Four times a day (QID) | ORAL | Status: DC
  Administered 2020-10-13 – 2020-10-14 (×3): 1000 mg via ORAL

## 2020-10-13 MED ORDER — ONDANSETRON HCL 4 MG PO TABS: 4.0000 mg | ORAL_TABLET | Freq: Four times a day (QID) | ORAL | Status: DC | PRN

## 2020-10-13 SURGICAL SUPPLY — 33 items
CANISTER SUCT 3000ML PPV (MISCELLANEOUS) ×4 IMPLANT
COVER LIGHT HANDLE STERIS (MISCELLANEOUS) ×2 IMPLANT
DECANTER SPIKE VIAL GLASS SM (MISCELLANEOUS) IMPLANT
DRAPE SHEET LG 3/4 BI-LAMINATE (DRAPES) ×16 IMPLANT
DRAPE STERI URO 9X17 APER PCH (DRAPES) ×6 IMPLANT
DRSG TEGADERM 4X4.75 (GAUZE/BANDAGES/DRESSINGS) ×2 IMPLANT
GAUZE PACKING 1 X5 YD ST (GAUZE/BANDAGES/DRESSINGS) ×4 IMPLANT
GLOVE BIOGEL PI IND STRL 7.0 (GLOVE) ×4 IMPLANT
GLOVE BIOGEL PI INDICATOR 7.0 (GLOVE) ×6
GLOVE ECLIPSE 6.5 STRL STRAW (GLOVE) ×4 IMPLANT
GOWN STRL REUS W/ TWL LRG LVL3 (GOWN DISPOSABLE) ×8 IMPLANT
GOWN STRL REUS W/TWL LRG LVL3 (GOWN DISPOSABLE) ×16
HIBICLENS CHG 4% 4OZ (MISCELLANEOUS) ×4 IMPLANT
KIT TURNOVER CYSTO (KITS) ×4 IMPLANT
LEGGING LITHOTOMY PAIR STRL (DRAPES) ×2 IMPLANT
NDL MAYO 6 CRC TAPER PT (NEEDLE) IMPLANT
NDL SPNL 22GX3.5 QUINCKE BK (NEEDLE) ×2 IMPLANT
NEEDLE HYPO 22GX1.5 SAFETY (NEEDLE) IMPLANT
NEEDLE MAYO 6 CRC TAPER PT (NEEDLE) ×4 IMPLANT
NEEDLE SPNL 22GX3.5 QUINCKE BK (NEEDLE) ×4 IMPLANT
NS IRRIG 1000ML POUR BTL (IV SOLUTION) ×4 IMPLANT
NS IRRIG 500ML POUR BTL (IV SOLUTION) ×2 IMPLANT
PACK VAGINAL WOMENS (CUSTOM PROCEDURE TRAY) ×4 IMPLANT
PAD OB MATERNITY 4.3X12.25 (PERSONAL CARE ITEMS) ×4 IMPLANT
SUT VIC AB 0 CT1 18XCR BRD8 (SUTURE) ×6 IMPLANT
SUT VIC AB 0 CT1 27 (SUTURE) ×8
SUT VIC AB 0 CT1 27XBRD ANBCTR (SUTURE) ×4 IMPLANT
SUT VIC AB 0 CT1 8-18 (SUTURE) ×12
SUT VIC AB 2-0 CT1 27 (SUTURE) ×4
SUT VIC AB 2-0 CT1 TAPERPNT 27 (SUTURE) IMPLANT
SUT VICRYL 0 TIES 12 18 (SUTURE) ×4 IMPLANT
TOWEL OR 17X26 10 PK STRL BLUE (TOWEL DISPOSABLE) ×8 IMPLANT
TRAY FOLEY W/BAG SLVR 14FR LF (SET/KITS/TRAYS/PACK) ×4 IMPLANT

## 2020-10-13 NOTE — Anesthesia Preprocedure Evaluation (Addendum)
Anesthesia Evaluation  Patient identified by MRN, date of birth, ID band Patient awake    Reviewed: Allergy & Precautions, NPO status , Patient's Chart, lab work & pertinent test results  History of Anesthesia Complications (+) PONV and history of anesthetic complications  Airway Mallampati: I       Dental no notable dental hx.    Pulmonary former smoker,    Pulmonary exam normal        Cardiovascular Normal cardiovascular exam     Neuro/Psych  Headaches, PSYCHIATRIC DISORDERS Anxiety Depression    GI/Hepatic Neg liver ROS, GERD  Medicated and Controlled,  Endo/Other    Renal/GU negative Renal ROS  negative genitourinary   Musculoskeletal negative musculoskeletal ROS (+)   Abdominal Normal abdominal exam  (+)   Peds  Hematology   Anesthesia Other Findings   Reproductive/Obstetrics negative OB ROS                             Anesthesia Physical Anesthesia Plan  ASA: II  Anesthesia Plan: General   Post-op Pain Management:    Induction: Intravenous  PONV Risk Score and Plan: 4 or greater and Ondansetron, Dexamethasone, Scopolamine patch - Pre-op and Midazolam  Airway Management Planned: Oral ETT  Additional Equipment: None  Intra-op Plan:   Post-operative Plan: Extubation in OR  Informed Consent: I have reviewed the patients History and Physical, chart, labs and discussed the procedure including the risks, benefits and alternatives for the proposed anesthesia with the patient or authorized representative who has indicated his/her understanding and acceptance.     Dental advisory given  Plan Discussed with: CRNA  Anesthesia Plan Comments:        Anesthesia Quick Evaluation

## 2020-10-13 NOTE — Anesthesia Postprocedure Evaluation (Signed)
Anesthesia Post Note  Patient: Lindsay Giles  Procedure(s) Performed: HYSTERECTOMY VAGINAL (N/A Vagina ) BILATERAL SALPINGECTOMY (Bilateral Vagina )     Patient location during evaluation: PACU Anesthesia Type: General Level of consciousness: awake and sedated Pain management: pain level controlled Vital Signs Assessment: post-procedure vital signs reviewed and stable Respiratory status: spontaneous breathing Cardiovascular status: stable Postop Assessment: no apparent nausea or vomiting Anesthetic complications: no   No complications documented.  Last Vitals:  Vitals:   10/13/20 1245 10/13/20 1300  BP: 106/71 101/61  Pulse: 69 74  Resp: 12 12  Temp:    SpO2: 98% 96%    Last Pain:  Vitals:   10/13/20 1255  TempSrc:   PainSc: 5    Pain Goal: Patients Stated Pain Goal: 3 (10/13/20 1255)                 Huston Foley

## 2020-10-13 NOTE — Progress Notes (Signed)
Day of Surgery Procedure(s) (LRB): HYSTERECTOMY VAGINAL (N/A) BILATERAL SALPINGECTOMY (Bilateral)  Subjective: Patient reports that pain is not well managed despite scheduled toradol/tylenol and Oxycodone 10 mg. R>L Tolerating clear liquids  diet without difficulty. No nausea / vomiting.  Ambulating without difficulty or distress. Has not voided yet.  Objective: BP (!) 93/41 (BP Location: Right Arm)   Pulse 67   Temp 98.6 F (37 C)   Resp 16   Ht 5\' 6"  (1.676 m)   Wt 59.9 kg   SpO2 94%   BMI 21.32 kg/m   Patient is sleepy and doses off during our visit. Abdomen:soft and appropriately tender. Slightly distended. Negative CVAT Extremities: Homans sign is negative, no sign of DVT   Assessment: s/p Procedure(s): HYSTERECTOMY VAGINAL BILATERAL SALPINGECTOMY: with pain mangement challenges.  Plan: Patient encouraged about outcomes Modifying pain management   LOS: 0 days    Dede Query Tonja Jezewski 10/13/2020, 6:23 PM

## 2020-10-13 NOTE — Transfer of Care (Signed)
Immediate Anesthesia Transfer of Care Note  Patient: Lindsay Giles  Procedure(s) Performed: Procedure(s) (LRB): HYSTERECTOMY VAGINAL (N/A) BILATERAL SALPINGECTOMY (Bilateral)  Patient Location: PACU  Anesthesia Type: General  Level of Consciousness: awake, alert  and oriented  Airway & Oxygen Therapy: Patient Spontanous Breathing and Patient connected to nasal cannula oxygen  Post-op Assessment: Report given to PACU RN and Post -op Vital signs reviewed and stable  Post vital signs: Reviewed and stable  Complications: No apparent anesthesia complications Last Vitals:  Vitals Value Taken Time  BP 127/85 10/13/20 1115  Temp 36.8 C 10/13/20 1109  Pulse 85 10/13/20 1119  Resp 12 10/13/20 1119  SpO2 99 % 10/13/20 1119  Vitals shown include unvalidated device data.  Last Pain:  Vitals:   10/13/20 0734  TempSrc: Oral  PainSc: 2       Patients Stated Pain Goal: 3 (07/05/67 1661)  Complications: No complications documented.

## 2020-10-13 NOTE — Interval H&P Note (Signed)
History and Physical Interval Note:  10/13/2020 8:55 AM  Lindsay Giles  has presented today for surgery, with the diagnosis of HEMATOMETRA SEVERE PELVIC PAIN.  The various methods of treatment have been discussed with the patient and family. After consideration of risks, benefits and other options for treatment, the patient has consented to  Procedure(s) with comments: HYSTERECTOMY VAGINAL (N/A) OPEN BILATERAL SALPINGECTOMY (Bilateral) - VAGINAL as a surgical intervention.  The patient's history has been reviewed, patient examined, no change in status, stable for surgery.  I have reviewed the patient's chart and labs.  Questions were answered to the patient's satisfaction.     Katharine Look A Ambriella Kitt

## 2020-10-13 NOTE — Progress Notes (Signed)
Pt complaining of pain at 8, states nothing has helped, Dr. Cletis Media by to see pt, pt unable to void since Foley cath removed at 4Pm. Several attempts noted, In and out catheter performed per order, 600cc clear yellow urine noted. Pt states pain is still no better. Pt sleepy and requiring verbal prompts at times. Pulse ox applied and Fentanyl IV given per order. Will continue to monitor and manage pain.

## 2020-10-13 NOTE — Anesthesia Procedure Notes (Signed)
Procedure Name: Intubation Date/Time: 10/13/2020 9:28 AM Performed by: Mechele Claude, CRNA Pre-anesthesia Checklist: Patient identified, Emergency Drugs available, Suction available and Patient being monitored Patient Re-evaluated:Patient Re-evaluated prior to induction Oxygen Delivery Method: Circle system utilized Preoxygenation: Pre-oxygenation with 100% oxygen Induction Type: IV induction and Cricoid Pressure applied Ventilation: Mask ventilation without difficulty Laryngoscope Size: Mac and 3 Grade View: Grade I Tube type: Oral Tube size: 7.0 mm Number of attempts: 1 Airway Equipment and Method: Stylet and Oral airway Placement Confirmation: ETT inserted through vocal cords under direct vision,  positive ETCO2 and breath sounds checked- equal and bilateral Secured at: 21 cm Tube secured with: Tape Dental Injury: Teeth and Oropharynx as per pre-operative assessment

## 2020-10-13 NOTE — Op Note (Addendum)
Preoperative diagnosis:Hematometra with severe pelvic pain  Postoperative diagnosis: Same   Anesthesia: General  Anesthesiologist: Hatchett  Procedure: Total vaginal hysterectomy with bilateral salpyngectomy  Surgeon: Dr. Katharine Look Zella Dewan   Assistant: Earnstine Regal P.A.-C.   Estimated blood loss: 50 cc   Procedure:   After being informed of the planned procedure with possible complications including but not limited to infection, bleeding, injury to other organs, need for laparotomy, expected hospitals they and recovery time, informed consent was obtained and patient was taken to or #1.   She was given general anesthesia with endotracheal intubation without any complication. She was placed in the lithotomy position prepped and draped in a sterile fashion and a Foley catheter was inserted in her bladder.   A weighted speculum is inserted in the vagina and the uterus was grasped with a Ardis Hughs forcep. We infiltrate the mucosa around the cervix using Vasopressine 20 Units/100 ml of saline. . We perform a circular incision around the cervix and then proceed with blunt and sharp dissection of the anterior and posterior vaginal mucosa. This identifies the posterior cul-de-sac which was sharply opened. The bladder was retracted upward . Both uterosacral ligaments are isolated on a Rogers forcep, sectioned, sutured with a transfixed suture of 0 Vicryl maintained for future suspension. The anterior cul-de-sac is then dissected up and the peritoneum is opened sharply. This allows for easy isolation of the vascular pedicle on each side using a Rogers forcep. The vascular pedicle is then sectioned and sutured with a transfixed suture of 0 Vicryl. We can then isolate part of the broad ligament on each side using a Rogers forceps. These pedicles are sectioned and sutured with a transfixed suture of 0 Vicryl. We now have easy access to the final pedicle incorporating the round ligament, the utero-ovarian ligament  and tube. This last pedicle is then clamped with Rogers forcep, sectioned and doubly sutured with a transfixed suture of 0 Vicryl and a free tie of 0 Vicryl. The uterus is removed.   Both ovaries are visualized and normal. We have access to both tubes so we proceed with bilateral salpingectomy by isolating the mesosalpinx on a Rogers forceps bilaterally. Each tube is then sharply removed and the pedicle is sutured with a transfix suture of 0 Vicryl and secured with a free tie of 0 Vicryl.   We then systematically verified hemostasis on all pedicles. Hemostasis is then confirmed adequate. We proceed with closure of the posterior cul-de-sac by placing a suture reuniting the 2 uterosacral ligaments as well as the posterior cul-de-sac with 0 Vicryl. We then placed 2 vaginal suspension sutures using 0 Vicryl which will you reunite the posterior vaginal mucosa, the posterior peritoneum, the corresponding uterosacral ligaments and the anterior peritoneum. The sutures are then tied which closes the peritoneum at the same time.   The vaginal cuff is then closed transversely using figure-of-eight sutures of 3-0 Vicryl.    Instruments and sponge count is complete x2.  The procedure is well tolerated by the patient who is taken to recovery room in a well and stable condition  Dr Cletis Media was present and scrubbed at all times. Surgical assistance was required due to the complexity of the vaginal approach.   Estimated blood loss is 50 cc   Specimen: Uterus with 2 tubes sent to pathology weighing 186 g

## 2020-10-14 ENCOUNTER — Encounter (HOSPITAL_BASED_OUTPATIENT_CLINIC_OR_DEPARTMENT_OTHER): Payer: Self-pay | Admitting: Obstetrics and Gynecology

## 2020-10-14 DIAGNOSIS — N857 Hematometra: Secondary | ICD-10-CM | POA: Diagnosis not present

## 2020-10-14 LAB — CBC
HCT: 34.7 % — ABNORMAL LOW (ref 36.0–46.0)
Hemoglobin: 11.5 g/dL — ABNORMAL LOW (ref 12.0–15.0)
MCH: 33.4 pg (ref 26.0–34.0)
MCHC: 33.1 g/dL (ref 30.0–36.0)
MCV: 100.9 fL — ABNORMAL HIGH (ref 80.0–100.0)
Platelets: 189 10*3/uL (ref 150–400)
RBC: 3.44 MIL/uL — ABNORMAL LOW (ref 3.87–5.11)
RDW: 12.6 % (ref 11.5–15.5)
WBC: 11.2 10*3/uL — ABNORMAL HIGH (ref 4.0–10.5)
nRBC: 0 % (ref 0.0–0.2)

## 2020-10-14 LAB — BASIC METABOLIC PANEL
Anion gap: 8 (ref 5–15)
BUN: 5 mg/dL — ABNORMAL LOW (ref 6–20)
CO2: 23 mmol/L (ref 22–32)
Calcium: 8 mg/dL — ABNORMAL LOW (ref 8.9–10.3)
Chloride: 106 mmol/L (ref 98–111)
Creatinine, Ser: 0.52 mg/dL (ref 0.44–1.00)
GFR, Estimated: >60 mL/min (ref >60)
Glucose, Bld: 146 mg/dL — ABNORMAL HIGH (ref 70–99)
Potassium: 3.6 mmol/L (ref 3.5–5.1)
Sodium: 137 mmol/L (ref 135–145)

## 2020-10-14 LAB — SURGICAL PATHOLOGY

## 2020-10-14 MED ORDER — FENTANYL CITRATE (PF) 100 MCG/2ML IJ SOLN
INTRAMUSCULAR | Status: AC
  Filled 2020-10-14: qty 2

## 2020-10-14 MED ORDER — OXYCODONE HCL 5 MG PO TABS
ORAL_TABLET | ORAL | Status: AC
  Filled 2020-10-14: qty 2

## 2020-10-14 MED ORDER — ACETAMINOPHEN 500 MG PO TABS
ORAL_TABLET | ORAL | Status: AC
  Filled 2020-10-14: qty 2

## 2020-10-14 MED ORDER — IBUPROFEN 200 MG PO TABS
ORAL_TABLET | ORAL | Status: AC
  Filled 2020-10-14: qty 3

## 2020-10-14 NOTE — Discharge Instructions (Signed)
Vaginal Hysterectomy, Care After Refer to this sheet in the next few weeks. These instructions provide you with information about caring for yourself after your procedure. Your health care provider may also give you more specific instructions. Your treatment has been planned according to current medical practices, but problems sometimes occur. Call your health care provider if you have any problems or questions after your procedure. What can I expect after the procedure? After the procedure, it is common to have:  Pain.  Soreness and numbness in your incision areas.  Vaginal bleeding and discharge.  Constipation.  Temporary problems emptying the bladder.  Feelings of sadness or other emotions. Follow these instructions at home: Medicines  Take over-the-counter and prescription medicines only as told by your health care provider.  If you were prescribed an antibiotic medicine, take it as told by your health care provider. Do not stop taking the antibiotic even if you start to feel better.  Do not drive or operate heavy machinery while taking prescription pain medicine. Activity  Return to your normal activities as told by your health care provider. Ask your health care provider what activities are safe for you.  Get regular exercise as told by your health care provider. You may be told to take short walks every day and go farther each time.  Do not lift anything that is heavier than 10 lb (4.5 kg). General instructions   Do not put anything in your vagina for 6 weeks after your surgery or as told by your health care provider. This includes tampons and douches.  Do not have sex until your health care provider says you can.  Do not take baths, swim, or use a hot tub until your health care provider approves.  Drink enough fluid to keep your urine clear or pale yellow.  Do not drive for 24 hours if you were given a sedative.  Keep all follow-up visits as told by your health  care provider. This is important. Contact a health care provider if:  Your pain medicine is not helping.  You have a fever.  You have redness, swelling, or pain at your incision site.  You have blood, pus, or a bad-smelling discharge from your vagina.  You continue to have difficulty urinating. Get help right away if:  You have severe abdominal or back pain.  You have heavy bleeding from your vagina.  You have chest pain or shortness of breath. This information is not intended to replace advice given to you by your health care provider. Make sure you discuss any questions you have with your health care provider. Document Revised: 06/03/2016 Document Reviewed: 10/26/2015 Elsevier Patient Education  2020 [employer]. Call [facility] @ [phone] if:  You have a temperature greater than or equal to 100.4 degrees Farenheit orally You have pain that is not made better by the pain medication given and taken as directed You have excessive bleeding or problems urinating  Take Colace (Docusate Sodium/Stool Softener) 100 mg 2-3 times daily while taking narcotic pain medicine to avoid constipation or until bowel movements are regular. Take Ibuprofen 600 mg with food and Acetaminophen 500 mg  (#2 tablets)  every 6 hours for 5 days then as needed for pain  You may drive after 2 weeks You may walk up steps  You may shower  You may resume a regular diet  Do not lift over 15 pounds for 6 weeks Avoid anything in vagina for 6 weeks (or until after your post-operative visit)

## 2020-10-14 NOTE — Progress Notes (Addendum)
Lindsay Giles is a89 y.o.  [id number]  Post Op Date # 1:  TVH/BS  Subjective: Patient is Doing well postoperatively. Patient has Pain is controlled with current analgesics. Medications being used: acetaminophen, prescription NSAID's including Ketorolac 30 mg IV and narcotic analgesics including oxycodone 5 mg..Patient has ambulated without dizziness, voided without difficulty and is tolerating a regular diet.   Objective: Vital signs in last 24 hours: Temp:  [97.5 F (36.4 C)-98.8 F (37.1 C)] 98.8 F (37.1 C) (12/21 0610) Pulse Rate:  [53-90] 53 (12/21 0610) Resp:  [12-20] 12 (12/21 0610) BP: (89-136)/(41-85) 91/53 (12/21 0610) SpO2:  [94 %-100 %] 96 % (12/21 0610)  Intake/Output from previous day: 12/20 0701 - 12/21 0700 In: 1955.8 [P.O.:360; I.V.:1495.8] Out: 2275 [Urine:2225] Intake/Output this shift: Total I/O In: -  Out: 400 [Urine:400] Recent Labs  Lab 10/14/20 0125  WBC 11.2*  HGB 11.5*  HCT 34.7*  PLT 189     Recent Labs  Lab 10/14/20 0125  NA 137  K 3.6  CL 106  CO2 23  BUN 5*  CREATININE 0.52  CALCIUM 8.0*  GLUCOSE 146*    EXAM: General: alert, cooperative and no distress Resp: clear to auscultation bilaterally Cardio: regular rate and rhythm, S1, S2 normal, no murmur, click, rub or gallop GI: bowel sounds present, soft. Extremities: Homans sign is negative, no sign of DVT and no calf tenderness   Assessment: s/p Procedure(s): HYSTERECTOMY VAGINAL BILATERAL SALPINGECTOMY: stable, progressing well and tolerating diet  Plan: Discharge home  The patient was given her discharge medications at her pre-op visit. [Ibuprofen 600 mg & Acetaminophen 1000 mg  every 6 hours and Roxicodone 5 mg prn for breakthrough pain.]  LOS: 0 days    Earnstine Regal, PA-C 10/14/2020 7:46 AM

## 2020-10-14 NOTE — Discharge Summary (Signed)
Physician Discharge Summary  Patient ID: Lindsay Giles MRN: [id number] DOB/AGE: 1976-08-30 44 y.o.  Admit date: 10/13/2020 Discharge date: 10/14/2020   Discharge Diagnoses:  Principal Problem:   Hematometra Chronic Pelvic Pain S/P Endometrial Ablation   Operation: Total Vaginal Hysterectomy with Bilateral Salpingectomy   Discharged Condition: Good  Hospital Course: On the date of admission the patient underwent the aforementioned procedures and tolerated them well.  Post operative course was unremarkable with the patient resuming bowel and bladder function by post operative day #1 and was therefore deemed ready for discharge home.  Discharge hemoglobin was 11.5.  Disposition: Discharge disposition: 01-Home or Self Care       Discharge Medications:  Allergies as of 10/14/2020      Reactions   Atrovent [ipratropium] Other (See Comments)   Uncontrollable nosebleeds (nasal spray)   Morphine And Related Itching, Other (See Comments)   Ineffective/itching      Medication List    STOP taking these medications   acetaminophen 500 MG tablet Commonly known as: TYLENOL   ketorolac 10 MG tablet Commonly known as: TORADOL     TAKE these medications   clonazePAM 0.5 MG tablet Commonly known as: KLONOPIN Take 0.5 mg by mouth 2 (two) times daily as needed for anxiety.   cyclobenzaprine 10 MG tablet Commonly known as: FLEXERIL Take 10 mg by mouth daily as needed (spasms.).   desvenlafaxine 100 MG 24 hr tablet Commonly known as: PRISTIQ Take 100 mg by mouth at bedtime.   dexlansoprazole 60 MG capsule Commonly known as: DEXILANT Take 1 capsule (60 mg total) by mouth daily before breakfast.   dicyclomine 10 MG capsule Commonly known as: BENTYL Take 1 capsule (10 mg total) by mouth 3 (three) times daily as needed for spasms.   famotidine 20 MG tablet Commonly known as: PEPCID Take 20 mg by mouth daily at 6 PM.   lisdexamfetamine 40 MG capsule Commonly known  as: Vyvanse Take 1 capsule (40 mg total) by mouth every morning. What changed:   when to take this  additional instructions   metoprolol succinate 25 MG 24 hr tablet Commonly known as: TOPROL-XL Take 1 tablet (25 mg total) by mouth daily.   ondansetron 8 MG tablet Commonly known as: ZOFRAN Take 1 tablet (8 mg total) by mouth daily as needed for nausea.   PROBIOTIC ACIDOPHILUS PO Take 1 capsule by mouth daily.   traZODone 100 MG tablet Commonly known as: DESYREL Take 200 mg by mouth at bedtime.   Vitamin B-12 5000 MCG Tbdp Take 5,000 mcg by mouth daily.   Vitamin D (Ergocalciferol) 1.25 MG (50000 UNIT) Caps capsule Commonly known as: DRISDOL Take 50,000 Units by mouth every Saturday.       The patient was given her discharge medications at her pre-op visit. [Ibuprofen 600 mg & Acetaminophen 1000 mg  every 6 hours and Roxicodone 5 mg prn for breakthrough pain.]    Follow-up: Dr. Katharine Look A. Rivard November 13, 2020 at 2 p.m.  Signed: Earnstine Regal, PA-C 10/14/2020, 8:01 AM

## 2020-10-22 ENCOUNTER — Ambulatory Visit (INDEPENDENT_AMBULATORY_CARE_PROVIDER_SITE_OTHER): Payer: 59 | Admitting: Psychology

## 2020-10-22 DIAGNOSIS — Z630 Problems in relationship with spouse or partner: Secondary | ICD-10-CM

## 2020-10-22 DIAGNOSIS — F411 Generalized anxiety disorder: Secondary | ICD-10-CM | POA: Diagnosis not present

## 2020-11-04 ENCOUNTER — Ambulatory Visit (INDEPENDENT_AMBULATORY_CARE_PROVIDER_SITE_OTHER): Payer: 59 | Admitting: Psychology

## 2020-11-04 DIAGNOSIS — F329 Major depressive disorder, single episode, unspecified: Secondary | ICD-10-CM | POA: Diagnosis not present

## 2020-11-04 DIAGNOSIS — F901 Attention-deficit hyperactivity disorder, predominantly hyperactive type: Secondary | ICD-10-CM

## 2020-11-05 ENCOUNTER — Telehealth: Payer: Self-pay | Admitting: Nurse Practitioner

## 2020-11-05 NOTE — Telephone Encounter (Signed)
Pt is requesting a call back from a nurse to discuss her GERD, pt states she had a procedure done on 11/16 that hasn't helped.

## 2020-11-05 NOTE — Telephone Encounter (Signed)
The pt wanted to know what she could take for her post hysterectomy pain. I advised her to call her surgeon to discuss. The pt has been advised of the information and verbalized understanding.

## 2020-11-10 ENCOUNTER — Ambulatory Visit (INDEPENDENT_AMBULATORY_CARE_PROVIDER_SITE_OTHER): Payer: 59 | Admitting: Psychology

## 2020-11-10 DIAGNOSIS — F331 Major depressive disorder, recurrent, moderate: Secondary | ICD-10-CM | POA: Diagnosis not present

## 2020-11-10 DIAGNOSIS — F909 Attention-deficit hyperactivity disorder, unspecified type: Secondary | ICD-10-CM

## 2020-11-18 ENCOUNTER — Ambulatory Visit: Payer: Self-pay | Admitting: Psychology

## 2020-11-24 ENCOUNTER — Ambulatory Visit (INDEPENDENT_AMBULATORY_CARE_PROVIDER_SITE_OTHER): Payer: 59 | Admitting: Psychology

## 2020-11-24 DIAGNOSIS — F331 Major depressive disorder, recurrent, moderate: Secondary | ICD-10-CM

## 2020-11-24 DIAGNOSIS — F419 Anxiety disorder, unspecified: Secondary | ICD-10-CM | POA: Diagnosis not present

## 2020-11-26 DIAGNOSIS — R102 Pelvic and perineal pain: Secondary | ICD-10-CM | POA: Diagnosis not present

## 2020-11-27 ENCOUNTER — Other Ambulatory Visit: Payer: Self-pay

## 2020-11-27 DIAGNOSIS — I10 Essential (primary) hypertension: Secondary | ICD-10-CM

## 2020-11-27 MED ORDER — METOPROLOL SUCCINATE ER 25 MG PO TB24: 25.0000 mg | ORAL_TABLET | Freq: Every day | ORAL | 1 refills | Status: DC

## 2020-11-28 ENCOUNTER — Other Ambulatory Visit: Payer: Self-pay

## 2020-11-28 DIAGNOSIS — I10 Essential (primary) hypertension: Secondary | ICD-10-CM

## 2020-11-28 MED ORDER — METOPROLOL SUCCINATE ER 25 MG PO TB24: 25.0000 mg | ORAL_TABLET | Freq: Every day | ORAL | 1 refills | Status: DC

## 2020-12-01 ENCOUNTER — Ambulatory Visit (INDEPENDENT_AMBULATORY_CARE_PROVIDER_SITE_OTHER): Payer: BC Managed Care – PPO | Admitting: Psychology

## 2020-12-01 DIAGNOSIS — F331 Major depressive disorder, recurrent, moderate: Secondary | ICD-10-CM

## 2020-12-01 DIAGNOSIS — F419 Anxiety disorder, unspecified: Secondary | ICD-10-CM

## 2020-12-02 DIAGNOSIS — R102 Pelvic and perineal pain: Secondary | ICD-10-CM | POA: Diagnosis not present

## 2020-12-02 DIAGNOSIS — R350 Frequency of micturition: Secondary | ICD-10-CM | POA: Diagnosis not present

## 2020-12-02 DIAGNOSIS — M545 Low back pain, unspecified: Secondary | ICD-10-CM | POA: Diagnosis not present

## 2020-12-03 ENCOUNTER — Other Ambulatory Visit: Payer: Self-pay | Admitting: Obstetrics and Gynecology

## 2020-12-03 ENCOUNTER — Other Ambulatory Visit: Payer: Self-pay

## 2020-12-03 ENCOUNTER — Ambulatory Visit
Admission: RE | Admit: 2020-12-03 | Discharge: 2020-12-03 | Disposition: A | Discharge status: Home / Self Care | Payer: BC Managed Care – PPO | Source: Ambulatory Visit | Attending: Obstetrics and Gynecology | Admitting: Obstetrics and Gynecology

## 2020-12-03 DIAGNOSIS — K429 Umbilical hernia without obstruction or gangrene: Secondary | ICD-10-CM | POA: Diagnosis not present

## 2020-12-03 DIAGNOSIS — R160 Hepatomegaly, not elsewhere classified: Secondary | ICD-10-CM | POA: Diagnosis not present

## 2020-12-03 DIAGNOSIS — M545 Low back pain, unspecified: Secondary | ICD-10-CM | POA: Diagnosis not present

## 2020-12-03 DIAGNOSIS — R102 Pelvic and perineal pain: Secondary | ICD-10-CM

## 2020-12-03 DIAGNOSIS — N8312 Corpus luteum cyst of left ovary: Secondary | ICD-10-CM | POA: Diagnosis not present

## 2020-12-03 MED ORDER — IOPAMIDOL (ISOVUE-300) INJECTION 61%
100.0000 mL | Freq: Once | INTRAVENOUS | Status: AC | PRN
  Administered 2020-12-03: 100 mL via INTRAVENOUS

## 2020-12-08 ENCOUNTER — Ambulatory Visit: Payer: 59 | Admitting: Psychology

## 2020-12-10 ENCOUNTER — Ambulatory Visit: Payer: 59 | Admitting: Internal Medicine

## 2020-12-16 ENCOUNTER — Ambulatory Visit (INDEPENDENT_AMBULATORY_CARE_PROVIDER_SITE_OTHER): Payer: BC Managed Care – PPO | Admitting: Psychology

## 2020-12-16 DIAGNOSIS — R350 Frequency of micturition: Secondary | ICD-10-CM | POA: Diagnosis not present

## 2020-12-16 DIAGNOSIS — R102 Pelvic and perineal pain: Secondary | ICD-10-CM | POA: Diagnosis not present

## 2020-12-16 DIAGNOSIS — F329 Major depressive disorder, single episode, unspecified: Secondary | ICD-10-CM

## 2020-12-16 DIAGNOSIS — F902 Attention-deficit hyperactivity disorder, combined type: Secondary | ICD-10-CM | POA: Diagnosis not present

## 2020-12-16 DIAGNOSIS — N760 Acute vaginitis: Secondary | ICD-10-CM | POA: Diagnosis not present

## 2020-12-17 ENCOUNTER — Encounter: Payer: Self-pay | Admitting: Internal Medicine

## 2020-12-17 ENCOUNTER — Ambulatory Visit: Payer: BC Managed Care – PPO | Admitting: Internal Medicine

## 2020-12-17 ENCOUNTER — Other Ambulatory Visit: Payer: Self-pay

## 2020-12-17 VITALS — BP 135/84 | HR 106 | Ht 66.0 in | Wt 128.0 lb

## 2020-12-17 DIAGNOSIS — R231 Pallor: Secondary | ICD-10-CM | POA: Diagnosis not present

## 2020-12-17 DIAGNOSIS — M797 Fibromyalgia: Secondary | ICD-10-CM | POA: Insufficient documentation

## 2020-12-17 DIAGNOSIS — R202 Paresthesia of skin: Secondary | ICD-10-CM | POA: Diagnosis not present

## 2020-12-17 DIAGNOSIS — R42 Dizziness and giddiness: Secondary | ICD-10-CM

## 2020-12-17 DIAGNOSIS — R21 Rash and other nonspecific skin eruption: Secondary | ICD-10-CM

## 2020-12-17 DIAGNOSIS — R768 Other specified abnormal immunological findings in serum: Secondary | ICD-10-CM

## 2020-12-17 DIAGNOSIS — E559 Vitamin D deficiency, unspecified: Secondary | ICD-10-CM

## 2020-12-17 NOTE — Progress Notes (Signed)
Office Visit Note  Patient: Lindsay Giles             Date of Birth: 07/05/1976           MRN: [id number]             PCP: Delorse Limber Referring: Brunetta Jeans, PA-C Visit Date: 12/17/2020   Subjective:  Follow-up (Patient complains of constant dry mouth, intermittent rashes, bilateral foot/leg numbness, memory fog, and frequent urination- patient complains of new onset of dizziness. )   History of Present Illness: Lindsay Giles is a 45 y.o. female here for follow up of positive ANA in the setting of skin rashes, tachycardia, joint stiffness, and fatigue with ENA workup unrevealing of other specific disease indications. Since the last visit she underwent hysterectomy without major complications.  She did take some antibiotic for UTI with very frequency.  New symptom complaint with sensation of lightheadedness most often with standing from a seated or horizontal position.  This usually lasts only a few seconds she has not fallen on account of it.  She denies vertigo denies nausea vomiting or syncope.  Suggest she continues to have persistent dry mouth, memory fog, erythema over the face and upper chest, and partial numbness of the feet   Previous HPI: She has had symptoms of erythematous rash over the face and front of her neck. She was evaluated in the hospital in September due to development of tachycardia and atypical chest pain that was worked up with transient troponin elevation had sinus tachycardia and sounds like some PVCs with very high heart rate.  Cardiac work-up was low risk for ischemic disease.  She was started on metoprolol that is apparently improved her tachycardia significantly.  She also has issues with pelvic pain seeing OB/GYN for this.  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  She has not noticed eye inflammation, oral ulcers, lymphadenopathy, or Raynaud's phenomenon.  She has 2 children with no  miscarriages or severe pregnancy complications no history of blood clots.  Labs reviewed 07/2020 ANA 1:160 nuclear, speckled   Review of Systems  Constitutional: Positive for fatigue.  HENT: Positive for mouth dryness. Negative for mouth sores and nose dryness.   Eyes: Negative for pain, itching, visual disturbance and dryness.  Respiratory: Positive for cough. Negative for hemoptysis, shortness of breath and difficulty breathing.   Cardiovascular: Positive for swelling in legs/feet. Negative for chest pain and palpitations.  Gastrointestinal: Positive for constipation. Negative for abdominal pain, blood in stool and diarrhea.  Endocrine: Positive for increased urination.  Genitourinary: Negative for painful urination.  Musculoskeletal: Positive for arthralgias, joint pain, joint swelling, myalgias, muscle weakness, morning stiffness, muscle tenderness and myalgias.  Skin: Positive for color change, rash and redness.  Allergic/Immunologic: Positive for susceptible to infections.  Neurological: Positive for dizziness, numbness, headaches, memory loss and weakness.  Hematological: Negative for swollen glands.  Psychiatric/Behavioral: Positive for confusion. Negative for sleep disturbance.    PMFS History:  Patient Active Problem List   Diagnosis Date Noted  . Dizziness 12/17/2020  . Fibromyalgia syndrome 12/17/2020  . Anxiety 10/08/2020  . Vitamin D deficiency 10/08/2020  . Facial rash 09/17/2020  . Positive ANA (antinuclear antibody) 09/17/2020  . Livedo reticularis without ulceration 09/17/2020  . Paresthesias 09/17/2020  . Elevated LDL cholesterol level 07/12/2020  . ADHD 07/11/2020  . Chest pain 07/10/2020  . Arthralgia 03/10/2014  . Abdominal pain 03/10/2014  .  Headache 03/10/2014  . Depression 03/10/2014  . Reflux esophagitis 03/10/2014  . Rectal pain 02/15/2014  . Menorrhagia     Past Medical History:  Diagnosis Date  . Abnormal Pap smear   . ADD (attention  deficit disorder)   . Anemia   . Anxiety and depression   . Complication of anesthesia   . Dysrhythmia   . Gastritis    Per patient  . GERD (gastroesophageal reflux disease)   . Menorrhagia   . Pneumonia   . PONV (postoperative nausea and vomiting)   . Rapid heart rate   . Tachycardia     Family History  Problem Relation Age of Onset  . Hypothyroidism Mother   . Breast cancer Paternal Grandmother   . Cancer Paternal Grandmother 35       breast  . Arrhythmia Paternal Grandmother   . Hyperlipidemia Paternal Grandmother   . Angina Paternal Grandmother   . Stroke Paternal Grandmother   . Diabetes Son   . Colon cancer Neg Hx   . Colon polyps Neg Hx   . Esophageal cancer Neg Hx   . Stomach cancer Neg Hx   . Rectal cancer Neg Hx    Past Surgical History:  Procedure Laterality Date  . BILATERAL SALPINGECTOMY Bilateral 10/13/2020   Procedure: BILATERAL SALPINGECTOMY;  Surgeon: Delsa Bern, MD;  Location: [facility];  Service: Gynecology;  Laterality: Bilateral;  VAGINAL  . BREAST REDUCTION SURGERY    . CERVICAL ABLATION     pt denies having had d&c  . DILATION AND CURETTAGE OF UTERUS     pt stateds did not have d & c , but rather a carvical ablation  . EXAMINATION UNDER ANESTHESIA N/A 02/17/2014   Procedure: EXAM UNDER ANESTHESIA,  ULTRASOUND OF RECTUM, INCISION AND EXCISION OF THROMBOSED CLOT ;  Surgeon: Shann Medal, MD;  Location: [facility];  Service: General;  Laterality: N/A;  . KNEE SURGERY    . right hand surgery     neuroma & groth on bone removed   . TONSILLECTOMY AND ADENOIDECTOMY    . VAGINAL HYSTERECTOMY N/A 10/13/2020   Procedure: HYSTERECTOMY VAGINAL;  Surgeon: Delsa Bern, MD;  Location: [facility];  Service: Gynecology;  Laterality: N/A;   Social History   Social History Narrative   Daughter of Dr. 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 Influenza Split 09/07/2012  . Influenza,inj,Quad PF,6+ Mos 10/19/2016  . Influenza,inj,quad, With Preservative 07/26/2019     Objective: Vital Signs: BP 135/84 (BP Location: Right Arm, Patient Position: Sitting, Cuff Size: Normal)   Pulse (!) 106   Ht _0  (1.676 m)   Wt 128 lb (58.1 kg)   LMP 09/07/2020   BMI 20.66 kg/m    Physical Exam HENT:     Right Ear: External ear normal.     Left Ear: External ear normal.     Mouth/Throat:     Mouth: Mucous membranes are moist.     Pharynx: Oropharynx is clear.     Comments: Brown discoloration on top of tongue Eyes:     Conjunctiva/sclera: Conjunctivae normal.  Skin:    General: Skin is warm and dry.     Comments: Facial erythema over cheeks crossing bridge of nose and around lips extending below the chin  Neurological:     General: No focal deficit present.     Mental Status: She is alert.  Psychiatric:        Mood and Affect: Mood normal.    <1cm diameter mobile lymph nodes palpable at left posterior cervical and right anterior cervical, right axillary positions  Musculoskeletal Exam:  Shoulders full ROM no tenderness or swelling Elbows full ROM no tenderness or swelling Wrists full ROM no tenderness or swelling Fingers full ROM no tenderness or swelling No tenderness to lateral hip palpation Knees full ROM no tenderness or swelling MTPs no squeeze tenderness  Investigation: No additional findings.  Imaging: CT ABDOMEN PELVIS W CONTRAST  Result Date: 12/03/2020 CLINICAL DATA:  Low back pain, left lower quadrant abdominal pain EXAM: CT ABDOMEN AND PELVIS WITH CONTRAST TECHNIQUE: Multidetector CT imaging of the abdomen and pelvis was performed using the standard protocol following bolus administration of intravenous contrast. CONTRAST:  163m ISOVUE-300 IOPAMIDOL (ISOVUE-300) INJECTION 61% COMPARISON:  08/27/2020 FINDINGS: Lower chest: The visualized lung bases are clear bilaterally. The visualized  heart and pericardium are unremarkable. Bilateral breast implants are partially visualized Hepatobiliary: There is mild hepatomegaly with the liver measuring 18.9 cm in craniocaudal dimension. No focal enhancing intrahepatic mass. No intra or extrahepatic biliary ductal dilation. Gallbladder unremarkable. Pancreas: Unremarkable Spleen: Unremarkable Adrenals/Urinary Tract: Adrenal glands are unremarkable. Kidneys are normal, without renal calculi, focal lesion, or hydronephrosis. Bladder is unremarkable. Stomach/Bowel: The stomach, small bowel, and large bowel are unremarkable. Appendix normal. No free intraperitoneal gas. Vascular/Lymphatic: No significant vascular findings are present. No enlarged abdominal or pelvic lymph nodes. Reproductive: Status post interval hysterectomy. There is a small amount of high attenuation free fluid within the pelvis likely representing hemorrhagic fluid. A corpus luteum is seen within the left ovary and, together, these findings may reflect changes of a ruptured corpus luteum. A rim enhancing 16 mm fluid collection is seen within the pelvis on axial image # 67 and coronal image # 54 within the resection bed which may represent a a small postoperative seroma or chronic hematoma. Super infection would be difficult to exclude on this examination alone. The residual right ovary is unremarkable. Other: Tiny fat containing umbilical hernia.  Rectum unremarkable. Musculoskeletal: No acute bone abnormality. IMPRESSION: Interval hysterectomy. 16 mm rim enhancing fluid collection within the pelvis is nonspecific, but may represent a postoperative collection such as a small hematoma or seroma. Super infection is difficult to exclude on this examination alone. Corpus luteum within the left ovary. Small high attenuation fluid within the pelvis likely represents a small amount of hemorrhagic fluid and, together, these findings may reflect changes of a ruptured corpus luteum. Mild hepatomegaly,  unchanged. Electronically Signed   By: AFidela SalisburyMD   On: 12/03/2020 12:25    Recent Labs: Lab Results  Component Value Date   WBC 11.2 (H) 10/14/2020   HGB 11.5 (L) 10/14/2020   PLT 189 10/14/2020   NA 137 10/14/2020   K 3.6 10/14/2020   CL 106 10/14/2020   CO2 23 10/14/2020   GLUCOSE 146 (H) 10/14/2020   BUN 5 (L) 10/14/2020   CREATININE 0.52 10/14/2020   BILITOT 0.4 08/21/2020   ALKPHOS 58 08/21/2020   AST 15 08/21/2020   ALT 12 08/21/2020   PROT 7.6 08/21/2020   ALBUMIN 4.9 08/21/2020   CALCIUM 8.0 (L) 10/14/2020   GFRAA >60 07/10/2020    Speciality Comments: No specialty comments available.  Procedures:  No procedures performed Allergies: Atrovent [ipratropium] and Morphine and related   Assessment / Plan:     Visit Diagnoses: Positive ANA (antinuclear antibody)  Paresthesias - Plan: ANA,  Anti-DNA antibody, double-stranded, C3 and C4, Sedimentation rate, VITAMIN D 25 Hydroxy (Vit-D Deficiency, Fractures)  Positive ANA test without clear clinical criteria of systemic lupus or other inflammatory arthritis. However continues to have generalized symptoms including lymphadenopathy and remains borderline low weight despite reporting normal appetite diet and activity level. Will repeat disease activity indicating labs ESR, complement, dsDNA. If nothing obvious may benefit with referral for lymph node biopsy if still having unexplained swelling.  Facial rash  Looks most consistent with rosacea at this time, not photosensitive or malar type.  Dizziness Fibromyalgia syndrome  Symptoms of fatigue, mental fog, dizziness, body aches consistent with FMS although this would not explain other objective features of rash or lymphadenopathy.  Vitamin D deficiency - Plan: VITAMIN D 25 Hydroxy (Vit-D Deficiency, Fractures)  Checking vitamin D level which has been low and has taken high dose replacement treatment 50000 units weekly.   Orders: Orders Placed This Encounter   Procedures  . ANA  . Anti-DNA antibody, double-stranded  . C3 and C4  . Sedimentation rate  . VITAMIN D 25 Hydroxy (Vit-D Deficiency, Fractures)   No orders of the defined types were placed in this encounter.   Follow-Up Instructions: No follow-ups on file.   Collier Salina, MD  Note - This record has been created using [employer].  Chart creation errors have been sought, but may not always  have been located. Such creation errors do not reflect on  the standard of medical care.

## 2020-12-17 NOTE — Patient Instructions (Addendum)
>   I am repeating blood tests looking for evidence of inflammatory disease activity including sedimentation rate, which is nonspecific, and very specific markers for lupus related disease.  > Several of your symptoms including dizziness, mental fog, bladder irritability can be seen with fibromyalgia syndrome. This syndrome is a chronic sensitization to pain or stressors that causes symptoms out of proportion to the amount of damage or inflammation present.  > I recommend checking out the [place] [facility] [place] patient-centered guide for fibromyalgia and chronic pain management: [email or web address]/

## 2020-12-19 LAB — ANTI-NUCLEAR AB-TITER (ANA TITER): ANA Titer 1: 1:160 {titer} — ABNORMAL HIGH

## 2020-12-19 LAB — SEDIMENTATION RATE: Sed Rate: 2 mm/h (ref 0–20)

## 2020-12-19 LAB — C3 AND C4
C3 Complement: 103 mg/dL (ref 83–193)
C4 Complement: 22 mg/dL (ref 15–57)

## 2020-12-19 LAB — VITAMIN D 25 HYDROXY (VIT D DEFICIENCY, FRACTURES): Vit D, 25-Hydroxy: 28 ng/mL — ABNORMAL LOW (ref 30–100)

## 2020-12-19 LAB — ANA: Anti Nuclear Antibody (ANA): POSITIVE — AB

## 2020-12-19 LAB — ANTI-DNA ANTIBODY, DOUBLE-STRANDED: ds DNA Ab: <1 IU/mL

## 2020-12-22 ENCOUNTER — Ambulatory Visit (INDEPENDENT_AMBULATORY_CARE_PROVIDER_SITE_OTHER): Payer: BC Managed Care – PPO | Admitting: Psychology

## 2020-12-22 ENCOUNTER — Other Ambulatory Visit: Payer: Self-pay | Admitting: Emergency Medicine

## 2020-12-22 DIAGNOSIS — F331 Major depressive disorder, recurrent, moderate: Secondary | ICD-10-CM

## 2020-12-22 DIAGNOSIS — K2100 Gastro-esophageal reflux disease with esophagitis, without bleeding: Secondary | ICD-10-CM

## 2020-12-22 DIAGNOSIS — F419 Anxiety disorder, unspecified: Secondary | ICD-10-CM | POA: Diagnosis not present

## 2020-12-22 MED ORDER — DEXLANSOPRAZOLE 60 MG PO CPDR: 60.0000 mg | DELAYED_RELEASE_CAPSULE | Freq: Every day | ORAL | 1 refills | Status: DC

## 2020-12-22 NOTE — Progress Notes (Signed)
Lab tests show the ANA test remains positive but all the more specific tests are negative. If she is still noticing lymphadenopathy I think referral to have this checked ideally with biopsy of a node to see what is causing this would be better than just trial of medications without a clear diagnosis, and can order this if she agrees.

## 2020-12-22 NOTE — Progress Notes (Signed)
Vitamin D level is now at slightly low level. I would recommend increasing daily intake/supplementation by at least 400 units or 10 mcg.

## 2021-01-05 ENCOUNTER — Telehealth: Payer: Self-pay | Admitting: Physician Assistant

## 2021-01-05 NOTE — Telephone Encounter (Signed)
Called and left message on patient vm that the PA was denied on dexilant.

## 2021-01-05 NOTE — Telephone Encounter (Signed)
Pt called in wanting to check the status on the PA for the dexilant. She states that she is out of the medication.  Please advise

## 2021-01-06 ENCOUNTER — Telehealth: Payer: Self-pay | Admitting: Physician Assistant

## 2021-01-06 NOTE — Telephone Encounter (Signed)
Called patient and discussed concerns.

## 2021-01-06 NOTE — Telephone Encounter (Signed)
Please call - returning your call

## 2021-01-08 ENCOUNTER — Telehealth: Payer: Self-pay

## 2021-01-08 NOTE — Telephone Encounter (Signed)
I have filled out the [employer] appeal form for the denial of Dexilant. Patient has tried and failed with both omeprazole and protonix.

## 2021-01-15 ENCOUNTER — Telehealth: Payer: Self-pay

## 2021-01-15 NOTE — Telephone Encounter (Signed)
Called and spoke with patient about her Rx of Dexilant that was denied by insurance. An appeal was filed on her behalf on 01/08/21. Notified patient that as soon as we hear from them about the appeal we will reach out to update her. Patient understood. No further concerns at this time.

## 2021-01-27 ENCOUNTER — Other Ambulatory Visit: Payer: Self-pay | Admitting: Family Medicine

## 2021-01-27 DIAGNOSIS — I10 Essential (primary) hypertension: Secondary | ICD-10-CM

## 2021-02-12 ENCOUNTER — Other Ambulatory Visit: Payer: Self-pay | Admitting: Family Medicine

## 2021-02-12 DIAGNOSIS — I10 Essential (primary) hypertension: Secondary | ICD-10-CM

## 2021-02-19 DIAGNOSIS — F909 Attention-deficit hyperactivity disorder, unspecified type: Secondary | ICD-10-CM | POA: Diagnosis not present

## 2021-02-19 DIAGNOSIS — F411 Generalized anxiety disorder: Secondary | ICD-10-CM | POA: Diagnosis not present

## 2021-02-19 DIAGNOSIS — F332 Major depressive disorder, recurrent severe without psychotic features: Secondary | ICD-10-CM | POA: Diagnosis not present

## 2021-04-17 ENCOUNTER — Ambulatory Visit: Payer: BC Managed Care – PPO | Admitting: Internal Medicine

## 2021-04-17 ENCOUNTER — Encounter: Payer: Self-pay | Admitting: Internal Medicine

## 2021-04-17 ENCOUNTER — Other Ambulatory Visit: Payer: Self-pay

## 2021-04-17 VITALS — BP 129/87 | HR 90 | Ht 66.0 in | Wt 129.4 lb

## 2021-04-17 DIAGNOSIS — R768 Other specified abnormal immunological findings in serum: Secondary | ICD-10-CM

## 2021-04-17 DIAGNOSIS — M79644 Pain in right finger(s): Secondary | ICD-10-CM | POA: Insufficient documentation

## 2021-04-17 DIAGNOSIS — M79642 Pain in left hand: Secondary | ICD-10-CM

## 2021-04-17 DIAGNOSIS — R682 Dry mouth, unspecified: Secondary | ICD-10-CM | POA: Diagnosis not present

## 2021-04-17 DIAGNOSIS — R21 Rash and other nonspecific skin eruption: Secondary | ICD-10-CM

## 2021-04-17 DIAGNOSIS — M79641 Pain in right hand: Secondary | ICD-10-CM | POA: Diagnosis not present

## 2021-04-17 NOTE — Patient Instructions (Signed)
I am recommending checking for an autoimmune disease antibody panel this includes more tests than our lab is able to normally assess and includes prognostic markers for developing or early disease process.   Besides starting medication for dry mouth, here are some other things you can do:  Sip fluids frequently. This is particularly helpful during meals. Make sure what you drink does not contain sugar and isn't acidic, as these will both increase your risk of tooth decay. All sodas, including diet varieties, should be avoided, as they are acidic and attack the tooth surface.  Chew sugarless gum. This will help stimulate saliva flow if your salivary glands are not damaged. Choose a variety that contains xylitol, a natural sugar substitute that can be protective against tooth decay.  Avoid drying/irritating foods and beverages. These include toast and crackers, salty and spicy foods, alcohol and caffeinated drinks.  Don't smoke. This can dry out the mouth and also increase your risk of gum disease.  Use a humidifier. Running a cool-mist humidifier at night can be soothing.  Use saliva stimulants/substitutes. There are prescription and over-the-counter products that can either stimulate saliva or act as a substitute oral fluid.  Practice good oral hygiene. Brush at least twice a day with a fluoride toothpaste; this will remove bacterial plaque and add minerals to strengthen your teeth. Don't forget to floss.  Have an exam/cleaning. If you have dry mouth, it's more important than ever to maintain your regular schedule of visits to the dental office.

## 2021-04-17 NOTE — Progress Notes (Addendum)
Office Visit Note  Patient: Lindsay Giles             Date of Birth: [birth year]           MRN: [id number]             PCP: Delorse Limber Referring: Brunetta Jeans, PA-C Visit Date: 04/17/2021   Subjective:  Follow-up (Patient complains of increased rash and color change. Patient also complains of increased bilateral hand pain and stiffness. )   History of Present Illness: Lindsay Giles is a 45 y.o. female here for follow up for ongoing symptoms including increased bilateral hand pain and stiffness and worsening of rashes. She has noticed increased redness and papules on her skin including face and trunk. Her hand pain has been frequent with some discoloration but also at rest without changes. Her fatigue remains very problematic. She has a lot of seemingly spontaneous bruising especially on bilateral legs. She is concerned over possible problem such as CREST syndrome on account of skin nodules arising alongside her GERD type symptoms and . She does not recall any other medical changes and no new treatments.    Previous HPI: 10/01/20 Lindsay Giles is a 44 y.o. female here for follow-up evaluation for positive ANA associated with rashes, weight loss, lymphadenopathy, livedo, arrhythmias, easy bruising, eyes and mouth dryness, and peripheral paresthesias. At her last visit multiple labs were checked including ENA panel plus inflammatory markers all negative. Symptoms remain about the same over the interval.  09/17/20 Lindsay Giles is a 45 y.o. female here for evaluation of positive ANA in the setting of skin rashes, tachycardia, joint stiffness, and fatigue.  She has had symptoms of erythematous rash over the face and front of her neck for a few years but other symptoms are dramatically increased within the past few months.  She was evaluated in the hospital in September due to development of tachycardia and atypical chest pain that was worked up with transient  troponin elevation had sinus tachycardia and sounds like some PVCs with very high heart rate.  Cardiac work-up was low risk for ischemic disease there is also question of chest pain attributable to GERD.  She was started on metoprolol that is apparently improved her tachycardia significantly.  She also has issues with pelvic pain seeing OB/GYN for this.  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  She has not noticed eye inflammation, oral ulcers, lymphadenopathy, or Raynaud's phenomenon.  She has 2 children with no miscarriages or severe pregnancy complications no history of blood clots.   Labs reviewed 07/2020 ANA 1:160 nuclear, speckled hsCRP 1.6 B-12 957 Folate 4.6 IgA 268 CBC 12.4 WBCs 77.1% neutrophils, Hgb 15.5 CMP normal CRP <1.0 TTG IgA negative   Pathology reviewed 08/2020 Endoscopy showing mild chronic gastritis no malignancy no small bowel disease 07/2020 Cervical cytology normal    Review of Systems  Constitutional:  Positive for fatigue.  HENT:  Positive for mouth dryness and nose dryness. Negative for mouth sores.   Eyes:  Positive for pain, itching, visual disturbance and dryness.  Respiratory:  Positive for cough, shortness of breath and difficulty breathing. Negative for hemoptysis.   Cardiovascular:  Positive for chest pain and palpitations. Negative for swelling in legs/feet.  Gastrointestinal:  Positive for abdominal pain and constipation. Negative for blood in stool and diarrhea.  Endocrine: Positive for increased urination.  Genitourinary:  Negative for painful urination.  Musculoskeletal:  Positive for joint pain, joint pain, joint swelling, myalgias, muscle weakness, morning stiffness, muscle tenderness and myalgias.  Skin:  Positive for color change, rash, nodules/bumps and redness. Negative for hair loss, skin tightness and ulcers.  Allergic/Immunologic: Negative for susceptible to infections.   Neurological:  Positive for dizziness, numbness, headaches, memory loss and weakness.  Hematological:  Positive for swollen glands.  Psychiatric/Behavioral:  Positive for confusion. Negative for sleep disturbance.    PMFS History:  Patient Active Problem List   Diagnosis Date Noted   Mouth dryness 04/17/2021   Rash and other nonspecific skin eruption 04/17/2021   Bilateral hand pain 04/17/2021   Dizziness 12/17/2020   Fibromyalgia syndrome 12/17/2020   Anxiety 10/08/2020   Vitamin D deficiency 10/08/2020   Facial rash 09/17/2020   Positive ANA (antinuclear antibody) 09/17/2020   Livedo reticularis without ulceration 09/17/2020   Paresthesias 09/17/2020   Elevated LDL cholesterol level 07/12/2020   ADHD 07/11/2020   Chest pain 07/10/2020   Arthralgia 03/10/2014   Abdominal pain 03/10/2014   Headache 03/10/2014   Depression 03/10/2014   Reflux esophagitis 03/10/2014   Rectal pain 02/15/2014   Menorrhagia     Past Medical History:  Diagnosis Date   Abnormal Pap smear    ADD (attention deficit disorder)    Anemia    Anxiety and depression    Complication of anesthesia    Dysrhythmia    Gastritis    Per patient   GERD (gastroesophageal reflux disease)    Menorrhagia    Pneumonia    PONV (postoperative nausea and vomiting)    Rapid heart rate    Tachycardia     Family History  Problem Relation Age of Onset   Hypothyroidism Mother    Breast cancer Paternal Grandmother    Cancer Paternal Grandmother 97       breast   Arrhythmia Paternal Grandmother    Hyperlipidemia Paternal Grandmother    Angina Paternal Grandmother    Stroke Paternal Grandmother    Diabetes Son    Colon cancer Neg Hx    Colon polyps Neg Hx    Esophageal cancer Neg Hx    Stomach cancer Neg Hx    Rectal cancer Neg Hx    Past Surgical History:  Procedure Laterality Date   BILATERAL SALPINGECTOMY Bilateral 10/13/2020   Procedure: BILATERAL SALPINGECTOMY;  Surgeon: Delsa Bern, MD;   Location: [facility];  Service: Gynecology;  Laterality: Bilateral;  VAGINAL   BREAST REDUCTION SURGERY     CERVICAL ABLATION     pt denies having had d&c   DILATION AND CURETTAGE OF UTERUS     pt stateds did not have d & c , but rather a carvical ablation   ESOPHAGEAL DILATION  08/2020   EXAMINATION UNDER ANESTHESIA N/A 02/17/2014   Procedure: EXAM UNDER ANESTHESIA,  ULTRASOUND OF RECTUM, INCISION AND EXCISION OF THROMBOSED CLOT ;  Surgeon: Shann Medal, MD;  Location: [facility];  Service: General;  Laterality: N/A;   KNEE SURGERY     right hand surgery     neuroma & groth on bone removed    TONSILLECTOMY AND ADENOIDECTOMY     VAGINAL HYSTERECTOMY N/A 10/13/2020   Procedure: HYSTERECTOMY VAGINAL;  Surgeon: Delsa Bern, MD;  Location: [facility];  Service: Gynecology;  Laterality: N/A;   Social History   Social History Narrative   Daughter of Dr. 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   Immunization History  Administered Date(s) Administered   Influenza Split 09/07/2012   Influenza,inj,Quad PF,6+ Mos 10/19/2016   Influenza,inj,quad, With Preservative 07/26/2019     Objective: Vital Signs: BP 129/87 (BP Location: Left Arm, Patient Position: Sitting, Cuff Size: Normal)   Pulse 90   Ht 5\' 6"  (1.676 m)   Wt 129 lb 6.4 oz (58.7 kg)   LMP 09/07/2020   BMI 20.89 kg/m    Physical Exam HENT:     Right Ear: External ear normal.     Left Ear: External ear normal.     Mouth/Throat:     Mouth: Mucous membranes are dry.     Comments: Browning of tongue surface Eyes:     Conjunctiva/sclera: Conjunctivae normal.  Skin:    General: Skin is warm and dry.     Findings: Erythema and rash present.     Comments: Facial erythema with few telangiectasias also some more erythematous small papules No nail or digital pitting or lesions Scattered small bruises present  Neurological:     General: No focal deficit  present.     Mental Status: She is alert.  Psychiatric:        Mood and Affect: Mood normal.     Musculoskeletal Exam:  Shoulders full ROM no tenderness or swelling Elbows full ROM no tenderness or swelling Wrists full ROM no tenderness or swelling Fingers full ROM no tenderness or swelling Knees full ROM no tenderness or swelling, some patellofemoral crepitus bilaterally    Investigation: No additional findings.  Imaging: No results found.  Recent Labs: Lab Results  Component Value Date   WBC 11.2 (H) 10/14/2020   HGB 11.5 (L) 10/14/2020   PLT 189 10/14/2020   NA 137 10/14/2020   K 3.6 10/14/2020   CL 106 10/14/2020   CO2 23 10/14/2020   GLUCOSE 146 (H) 10/14/2020   BUN 5 (L) 10/14/2020   CREATININE 0.52 10/14/2020   BILITOT 0.4 08/21/2020   ALKPHOS 58 08/21/2020   AST 15 08/21/2020   ALT 12 08/21/2020   PROT 7.6 08/21/2020   ALBUMIN 4.9 08/21/2020   CALCIUM 8.0 (L) 10/14/2020   GFRAA >60 07/10/2020    Speciality Comments: No specialty comments available.  Procedures:  No procedures performed Allergies: Atrovent [ipratropium] and Morphine and related   Assessment / Plan:     Visit Diagnoses: Positive ANA (antinuclear antibody) Bilateral hand pain  Positive ANA with symptoms of facial rash, mouth dryness, fatigue, arthralgias although no highly specific serology or exam finding. I recommend for AVISE CTD testing for any more specific biomarkers of disease. Symptoms are reportedly increased, might represent early disease with more specific positive finding.  Facial rash Rash and other nonspecific skin eruption  Rash is still not typical for ACLE malar type rash, although worse than before with more localized papules. Multiple telangiectasias but without digital ischemic changes or sclerodactyly..  Mouth dryness  Dry mouth symptoms and tongue discoloration also dry eyes questionably sicca symptoms in the context of other issues. Workup as above at this  time.  Orders: No orders of the defined types were placed in this encounter.  No orders of the defined types were placed in this encounter.    Follow-Up Instructions: No follow-ups on file.   Collier Salina, MD  Note - This record has been created using [employer].  Chart creation errors have been sought, but may not always  have been located. Such creation errors do not reflect on  the standard of medical care.  Addendum 05/18/21  Reviewed AVISE CTD lab results these demonstrate positive ANA at 1:320 speckled titer otherwise negative ENA antibodies. 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 I discussed with Ms. Tessmer findings could be consistent with Sjogren syndrome although no definitive with negative SSA serology. I have not seen particular evidence for rheumatoid arthritis on exams to date.

## 2021-04-21 DIAGNOSIS — R768 Other specified abnormal immunological findings in serum: Secondary | ICD-10-CM | POA: Diagnosis not present

## 2021-04-21 DIAGNOSIS — R682 Dry mouth, unspecified: Secondary | ICD-10-CM | POA: Diagnosis not present

## 2021-04-21 DIAGNOSIS — R21 Rash and other nonspecific skin eruption: Secondary | ICD-10-CM | POA: Diagnosis not present

## 2021-04-21 DIAGNOSIS — M79641 Pain in right hand: Secondary | ICD-10-CM | POA: Diagnosis not present

## 2021-05-08 ENCOUNTER — Other Ambulatory Visit: Payer: Self-pay

## 2021-05-08 ENCOUNTER — Other Ambulatory Visit: Payer: Self-pay | Admitting: Family

## 2021-05-08 DIAGNOSIS — K2100 Gastro-esophageal reflux disease with esophagitis, without bleeding: Secondary | ICD-10-CM

## 2021-05-08 MED ORDER — PANTOPRAZOLE SODIUM 40 MG PO TBEC: 40.0000 mg | DELAYED_RELEASE_TABLET | Freq: Every day | ORAL | 0 refills | Status: DC

## 2021-05-18 ENCOUNTER — Telehealth: Payer: Self-pay | Admitting: Internal Medicine

## 2021-05-18 NOTE — Telephone Encounter (Signed)
Patient requesting a call back in reference to her lab results. She has results, and would like to go over those. Please call to advise.

## 2021-05-18 NOTE — Telephone Encounter (Signed)
I spoke with Lindsay Giles her AVISE labs showed negative for lupus but ANA is higher titer than before also now low positive for RF IgA which is new. She has upcoming appointment with dermatology tomorrow I recommend she ask them about if any current lesions would be consistent with cutaneous manifestations of sjogren's syndrome or other systemic connective tissue disease, or consider biopsy if diagnosis is very unclear. We discussed fax of office note to [facility].

## 2021-05-18 NOTE — Telephone Encounter (Signed)
Spoke with patient, she had AVISE labs done. We have not received lab results, however I will reach out to [facility] to have results faxed. Patient received results via email and is also going to forward these to me.   Patient is going to the dermatologist tomorrow morning, she'll be seeing Dr. Elvera Lennox at [facility]. Is there anything specific you are concerned with patient should address at that appointment?

## 2021-05-19 DIAGNOSIS — I788 Other diseases of capillaries: Secondary | ICD-10-CM | POA: Diagnosis not present

## 2021-05-19 DIAGNOSIS — L700 Acne vulgaris: Secondary | ICD-10-CM | POA: Diagnosis not present

## 2021-05-19 NOTE — Telephone Encounter (Signed)
04/17/2021 office note has been faxed to [facility].

## 2021-05-29 DIAGNOSIS — F411 Generalized anxiety disorder: Secondary | ICD-10-CM | POA: Diagnosis not present

## 2021-05-29 DIAGNOSIS — F909 Attention-deficit hyperactivity disorder, unspecified type: Secondary | ICD-10-CM | POA: Diagnosis not present

## 2021-05-29 DIAGNOSIS — F332 Major depressive disorder, recurrent severe without psychotic features: Secondary | ICD-10-CM | POA: Diagnosis not present

## 2021-06-22 ENCOUNTER — Ambulatory Visit (INDEPENDENT_AMBULATORY_CARE_PROVIDER_SITE_OTHER): Payer: BC Managed Care – PPO | Admitting: Psychology

## 2021-06-22 DIAGNOSIS — F902 Attention-deficit hyperactivity disorder, combined type: Secondary | ICD-10-CM | POA: Diagnosis not present

## 2021-06-22 DIAGNOSIS — F331 Major depressive disorder, recurrent, moderate: Secondary | ICD-10-CM | POA: Diagnosis not present

## 2021-06-30 ENCOUNTER — Ambulatory Visit (INDEPENDENT_AMBULATORY_CARE_PROVIDER_SITE_OTHER): Payer: BC Managed Care – PPO | Admitting: Psychology

## 2021-06-30 DIAGNOSIS — F909 Attention-deficit hyperactivity disorder, unspecified type: Secondary | ICD-10-CM

## 2021-06-30 DIAGNOSIS — F331 Major depressive disorder, recurrent, moderate: Secondary | ICD-10-CM

## 2021-07-07 ENCOUNTER — Ambulatory Visit (INDEPENDENT_AMBULATORY_CARE_PROVIDER_SITE_OTHER): Payer: BC Managed Care – PPO | Admitting: Psychology

## 2021-07-07 DIAGNOSIS — F900 Attention-deficit hyperactivity disorder, predominantly inattentive type: Secondary | ICD-10-CM | POA: Diagnosis not present

## 2021-07-07 DIAGNOSIS — F331 Major depressive disorder, recurrent, moderate: Secondary | ICD-10-CM | POA: Diagnosis not present

## 2021-07-13 ENCOUNTER — Ambulatory Visit (INDEPENDENT_AMBULATORY_CARE_PROVIDER_SITE_OTHER): Payer: BC Managed Care – PPO | Admitting: Psychology

## 2021-07-13 DIAGNOSIS — F331 Major depressive disorder, recurrent, moderate: Secondary | ICD-10-CM | POA: Diagnosis not present

## 2021-07-13 DIAGNOSIS — F902 Attention-deficit hyperactivity disorder, combined type: Secondary | ICD-10-CM

## 2021-07-15 ENCOUNTER — Ambulatory Visit (INDEPENDENT_AMBULATORY_CARE_PROVIDER_SITE_OTHER): Payer: BC Managed Care – PPO | Admitting: Psychology

## 2021-07-15 DIAGNOSIS — F331 Major depressive disorder, recurrent, moderate: Secondary | ICD-10-CM | POA: Diagnosis not present

## 2021-07-15 DIAGNOSIS — F902 Attention-deficit hyperactivity disorder, combined type: Secondary | ICD-10-CM

## 2021-07-21 ENCOUNTER — Ambulatory Visit (INDEPENDENT_AMBULATORY_CARE_PROVIDER_SITE_OTHER): Payer: BC Managed Care – PPO | Admitting: Psychology

## 2021-07-21 DIAGNOSIS — F902 Attention-deficit hyperactivity disorder, combined type: Secondary | ICD-10-CM | POA: Diagnosis not present

## 2021-07-21 DIAGNOSIS — F331 Major depressive disorder, recurrent, moderate: Secondary | ICD-10-CM | POA: Diagnosis not present

## 2021-07-29 ENCOUNTER — Ambulatory Visit: Payer: BC Managed Care – PPO | Admitting: Psychology

## 2021-07-29 DIAGNOSIS — L719 Rosacea, unspecified: Secondary | ICD-10-CM | POA: Diagnosis not present

## 2021-07-29 DIAGNOSIS — Z23 Encounter for immunization: Secondary | ICD-10-CM | POA: Diagnosis not present

## 2021-07-31 ENCOUNTER — Ambulatory Visit (INDEPENDENT_AMBULATORY_CARE_PROVIDER_SITE_OTHER): Payer: BC Managed Care – PPO | Admitting: Psychology

## 2021-07-31 DIAGNOSIS — F331 Major depressive disorder, recurrent, moderate: Secondary | ICD-10-CM | POA: Diagnosis not present

## 2021-07-31 DIAGNOSIS — F419 Anxiety disorder, unspecified: Secondary | ICD-10-CM | POA: Diagnosis not present

## 2021-08-05 ENCOUNTER — Other Ambulatory Visit: Payer: Self-pay | Admitting: Family

## 2021-08-05 ENCOUNTER — Ambulatory Visit (INDEPENDENT_AMBULATORY_CARE_PROVIDER_SITE_OTHER): Payer: BC Managed Care – PPO | Admitting: Psychology

## 2021-08-05 DIAGNOSIS — F902 Attention-deficit hyperactivity disorder, combined type: Secondary | ICD-10-CM

## 2021-08-05 DIAGNOSIS — K2100 Gastro-esophageal reflux disease with esophagitis, without bleeding: Secondary | ICD-10-CM

## 2021-08-05 DIAGNOSIS — F331 Major depressive disorder, recurrent, moderate: Secondary | ICD-10-CM | POA: Diagnosis not present

## 2021-08-12 ENCOUNTER — Ambulatory Visit (INDEPENDENT_AMBULATORY_CARE_PROVIDER_SITE_OTHER): Payer: BC Managed Care – PPO | Admitting: Psychology

## 2021-08-12 DIAGNOSIS — F331 Major depressive disorder, recurrent, moderate: Secondary | ICD-10-CM | POA: Diagnosis not present

## 2021-08-12 DIAGNOSIS — F902 Attention-deficit hyperactivity disorder, combined type: Secondary | ICD-10-CM | POA: Diagnosis not present

## 2021-09-03 DIAGNOSIS — Z1231 Encounter for screening mammogram for malignant neoplasm of breast: Secondary | ICD-10-CM | POA: Diagnosis not present

## 2021-10-21 DIAGNOSIS — F909 Attention-deficit hyperactivity disorder, unspecified type: Secondary | ICD-10-CM | POA: Diagnosis not present

## 2021-10-21 DIAGNOSIS — F411 Generalized anxiety disorder: Secondary | ICD-10-CM | POA: Diagnosis not present

## 2021-10-21 DIAGNOSIS — F332 Major depressive disorder, recurrent severe without psychotic features: Secondary | ICD-10-CM | POA: Diagnosis not present

## 2021-11-10 ENCOUNTER — Ambulatory Visit (INDEPENDENT_AMBULATORY_CARE_PROVIDER_SITE_OTHER): Payer: BC Managed Care – PPO | Admitting: Gastroenterology

## 2021-11-10 ENCOUNTER — Encounter: Payer: Self-pay | Admitting: Gastroenterology

## 2021-11-10 ENCOUNTER — Other Ambulatory Visit (INDEPENDENT_AMBULATORY_CARE_PROVIDER_SITE_OTHER): Payer: BC Managed Care – PPO

## 2021-11-10 ENCOUNTER — Other Ambulatory Visit: Payer: Self-pay | Admitting: Gastroenterology

## 2021-11-10 DIAGNOSIS — R1013 Epigastric pain: Secondary | ICD-10-CM

## 2021-11-10 DIAGNOSIS — R1033 Periumbilical pain: Secondary | ICD-10-CM

## 2021-11-10 LAB — COMPREHENSIVE METABOLIC PANEL
ALT: 9 U/L (ref 0–35)
AST: 15 U/L (ref 0–37)
Albumin: 4.6 g/dL (ref 3.5–5.2)
Alkaline Phosphatase: 58 U/L (ref 39–117)
BUN: 9 mg/dL (ref 6–23)
CO2: 25 mEq/L (ref 19–32)
Calcium: 9.6 mg/dL (ref 8.4–10.5)
Chloride: 101 mEq/L (ref 96–112)
Creatinine, Ser: 0.64 mg/dL (ref 0.40–1.20)
GFR: 107.79 mL/min (ref >60.00)
Glucose, Bld: 103 mg/dL — ABNORMAL HIGH (ref 70–99)
Potassium: 4.1 mEq/L (ref 3.5–5.1)
Sodium: 136 mEq/L (ref 135–145)
Total Bilirubin: 0.4 mg/dL (ref 0.2–1.2)
Total Protein: 7.8 g/dL (ref 6.0–8.3)

## 2021-11-10 LAB — CBC
HCT: 46.5 % — ABNORMAL HIGH (ref 36.0–46.0)
Hemoglobin: 15.5 g/dL — ABNORMAL HIGH (ref 12.0–15.0)
MCHC: 33.3 g/dL (ref 30.0–36.0)
MCV: 91 fl (ref 78.0–100.0)
Platelets: 261 10*3/uL (ref 150.0–400.0)
RBC: 5.11 Mil/uL (ref 3.87–5.11)
RDW: 12.8 % (ref 11.5–15.5)
WBC: 8.7 10*3/uL (ref 4.0–10.5)

## 2021-11-10 MED ORDER — OMEPRAZOLE 40 MG PO CPDR: 40.0000 mg | DELAYED_RELEASE_CAPSULE | Freq: Every day | ORAL | 1 refills | Status: DC

## 2021-11-10 MED ORDER — CITRUCEL PO POWD: 1.0000 | Freq: Every day | ORAL | Status: AC

## 2021-11-10 NOTE — Patient Instructions (Addendum)
If you are age 44 or younger, your body mass index should be between 19-25. Your Body mass index is 20.14 kg/m. If this is out of the aformentioned range listed, please consider follow up with your Primary Care Provider.  ______________________________________________________  The [facility] providers would like to encourage you to use [facility] to communicate with providers for non-urgent requests or questions.  Due to long hold times on the telephone, sending your provider a message by [facility] may be a faster and more efficient way to get a response.  Please allow 48 business hours for a response.  Please remember that this is for non-urgent requests.  _______________________________________________________  Your provider has requested that you go to the basement level for lab work before leaving today. Press "B" on the elevator. The lab is located at the first door on the left as you exit the elevator.  You have been scheduled for a CT scan of the abdomen and pelvis at [facility] ([address]---this is in the same building as [employer]).   You are scheduled on 11-13-21 at 1:30pm. You should arrive 15 minutes prior to your appointment time for registration. Please follow the written instructions below on the day of your exam:  WARNING: IF YOU ARE ALLERGIC TO IODINE/X-RAY DYE, PLEASE NOTIFY RADIOLOGY IMMEDIATELY AT [phone]! YOU WILL BE GIVEN A 13 HOUR PREMEDICATION PREP.  1) Do not eat or drink anything after 9:30am (4 hours prior to your test) 2) You have been given 2 bottles of oral contrast to drink. The solution may taste better if refrigerated, but do NOT add ice or any other liquid to this solution. Shake well before drinking.    Drink 1 bottle of contrast @ 11:30am (2 hours prior to your exam)  Drink 1 bottle of contrast @ 12:30pm (1 hour prior to your exam)  You may take any medications as prescribed with a small amount of water, if necessary. If you  take any of the following medications: METFORMIN, GLUCOPHAGE, GLUCOVANCE, AVANDAMET, RIOMET, FORTAMET, [place] MET, JANUMET, GLUMETZA or METAGLIP, you MAY be asked to HOLD this medication 48 hours AFTER the exam.  The purpose of you drinking the oral contrast is to aid in the visualization of your intestinal tract. The contrast solution may cause some diarrhea. Depending on your individual set of symptoms, you may also receive an intravenous injection of x-ray contrast/dye. Plan on being at [facility] for 30 minutes or longer, depending on the type of exam you are having performed.  This test typically takes 30-45 minutes to complete.  If you have any questions regarding your exam or if you need to reschedule, you may call the CT department at [phone] between the hours of 8:00 am and 5:00 pm, Monday-Friday.  ________________________________________________________________________  Due to recent changes in healthcare laws, you may see the results of your imaging and laboratory studies on MyChart before your provider has had a chance to review them.  We understand that in some cases there may be results that are confusing or concerning to you. Not all laboratory results come back in the same time frame and the provider may be waiting for multiple results in order to interpret others.  Please give [place] 48 hours in order for your provider to thoroughly review all the results before contacting the office for clarification of your results.   Please start taking citrucel (orange flavored) powder fiber supplement.  This may cause some bloating at first but that usually  goes away. Begin with a small spoonful and work your way up to a large, heaping spoonful daily over a week.  We have sent the following medications to your pharmacy for you to pick up at your convenience:  START: Omeprazole 89m take one capsule shortly before breakfast meal each day.  DISCONTINUE: other antiacid medications that  you are currently using.  KEEP follow up appointment with JAnderson Maltaon 11-19-21 @ 11:30am.  Thank you for entrusting me with your care and choosing [facility]  Dr JArdis Hughs

## 2021-11-10 NOTE — Progress Notes (Signed)
Review of pertinent gastrointestinal problems: 1.  Routine risk for colon cancer.  Colonoscopy Dr. Rush Landmark 08/2020 found hemorrhoids, no polyps.  This was done for generalized abdominal pain. 2.  Heartburn, abnormal CT scan (esophageal thickening) led to EGD Dr. Rush Landmark 08/2020 which showed a 2 cm hiatal hernia was otherwise completely normal.  Biopsies from the stomach, esophagus and duodenum were all completely normal.   HPI: This is a pleasant 46 year old woman, patient of Dr. Rush Landmark who needed to be seen on a relatively urgent basis today.   CT scan abdomen pelvis with IV and oral contrast February 2022 indication "low back pain, left lower quadrant abdominal pain" findings possible "ruptured corpus luteum"  She is a friend of CRNA Josh Monday whom I work with and he discussed her issues with me last week and earlier this week to help expedite her care.  Looks like she was also previously a patient of Dr. Leonie Douglas at [facility] gastroenterology (dating back at least to 2015)  Her weight is down 1 pound from her last office visit here about 1-1/2 years ago.  Same scale  She has myriad upper and lower GI symptoms.  For at least 2 to 3 weeks she has had abdominal pain, worse bloating.  This is in the epigastric it is worse when sitting it is worse with movement.  The pain is constant.  She cannot characterize it, when I asked her if it is a burning or stabbing or pressure she says it is "all the above".  She has heartburn, trouble swallowing at times.  She is no longer on proton pump inhibitor.  Said the prescriptions ran out and she has not been able to get a new one.  Currently she is on what sounds like famotidine 1 pill once daily and more recently she started to twice daily.  She is not really sure how often she has intermittent dysphagia.  She says it varies.  She has nausea rather chronically.  It is more frequent recently.  She says she is reluctant to take more of her Zofran  because it causes constipation.  She struggles with constipation.  She takes a stool softener every day for many years.  She has recently added an over-the-counter laxative.  She is not sure how often she would move her bowels if she stopped those.  She does not see blood in her stool.  She does not take NSAIDs.  She tells me she has lost 20 pounds in the past year however her weight on our scales shows her weight is really about the same as it was a year and a half ago  ROS: complete GI ROS as described in HPI, all other review negative.  Constitutional:  No unintentional weight loss   Past Medical History:  Diagnosis Date   Abnormal Pap smear    ADD (attention deficit disorder)    Anemia    Anxiety and depression    Complication of anesthesia    Dysrhythmia    Gastritis    Per patient   GERD (gastroesophageal reflux disease)    Menorrhagia    Pneumonia    PONV (postoperative nausea and vomiting)    Rapid heart rate    Tachycardia     Past Surgical History:  Procedure Laterality Date   BILATERAL SALPINGECTOMY Bilateral 10/13/2020   Procedure: BILATERAL SALPINGECTOMY;  Surgeon: Delsa Bern, MD;  Location: [place];  Service: Gynecology;  Laterality: Bilateral;  VAGINAL   BREAST REDUCTION SURGERY  CERVICAL ABLATION     pt denies having had d&c   DILATION AND CURETTAGE OF UTERUS     pt stateds did not have d & c , but rather a carvical ablation   ESOPHAGEAL DILATION  08/2020   EXAMINATION UNDER ANESTHESIA N/A 02/17/2014   Procedure: EXAM UNDER ANESTHESIA,  ULTRASOUND OF RECTUM, INCISION AND EXCISION OF THROMBOSED CLOT ;  Surgeon: Shann Medal, MD;  Location: [facility];  Service: General;  Laterality: N/A;   KNEE SURGERY     right hand surgery     neuroma & groth on bone removed    TONSILLECTOMY AND ADENOIDECTOMY     VAGINAL HYSTERECTOMY N/A 10/13/2020   Procedure: HYSTERECTOMY VAGINAL;  Surgeon: Delsa Bern, MD;  Location: [place];  Service: Gynecology;  Laterality: N/A;    Current Outpatient Medications  Medication Sig Dispense Refill   clonazePAM (KLONOPIN) 0.5 MG tablet Take 0.5 mg by mouth 2 (two) times daily as needed for anxiety.     Cyanocobalamin (VITAMIN B-12) 5000 MCG TBDP Take 5,000 mcg by mouth daily.     desvenlafaxine (PRISTIQ) 100 MG 24 hr tablet Take 100 mg by mouth at bedtime.      Docusate Calcium (STOOL SOFTENER PO) Take 1 capsule by mouth daily. *takes "stool softener" daily, takes additional docusate as needed     famotidine (PEPCID) 20 MG tablet Take 20 mg by mouth daily at 6 PM.     lisdexamfetamine (VYVANSE) 40 MG capsule Take 1 capsule (40 mg total) by mouth every morning. 30 capsule 0   Omega-3 Fatty Acids (FISH OIL PO) Take 1 capsule by mouth daily.     ondansetron (ZOFRAN) 8 MG tablet Take 1 tablet (8 mg total) by mouth daily as needed for nausea. 20 tablet 0   traZODone (DESYREL) 100 MG tablet Take 200 mg by mouth at bedtime.      VITAMIN D PO Take 3 capsules by mouth daily.     No current facility-administered medications for this visit.    Allergies as of 11/10/2021 - Review Complete 11/10/2021  Allergen Reaction Noted   Atrovent [ipratropium] Other (See Comments) 07/27/2012   Morphine and related Itching and Other (See Comments) 09/23/2020    Family History  Problem Relation Age of Onset   Hypothyroidism Mother    Breast cancer Paternal Grandmother    Cancer Paternal Grandmother 65       breast   Arrhythmia Paternal Grandmother    Hyperlipidemia Paternal Grandmother    Angina Paternal Grandmother    Stroke Paternal Grandmother    Diabetes Son    Colon cancer Neg Hx    Colon polyps Neg Hx    Esophageal cancer Neg Hx    Stomach cancer Neg Hx    Rectal cancer Neg Hx     Social History   Socioeconomic History   Marital status: Married    Spouse name: Not on file   Number of children: Not on file   Years of education: Not on file   Highest education level: Not  on file  Occupational History   Not on file  Tobacco Use   Smoking status: Former    Types: Cigarettes   Smokeless tobacco: Never   Tobacco comments:    Social smoker  Vaping Use   Vaping Use: Never used  Substance and Sexual Activity   Alcohol use: Yes    Comment: Occasionally   Drug use: No   Sexual activity: Yes  Partners: Male    Birth control/protection: Surgical    Comment: vas   Other Topics Concern   Not on file  Social History Narrative   Daughter of Dr. 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 Health   Financial Resource Strain: Not on file  Food Insecurity: Not on file  Transportation Needs: Not on file  Physical Activity: Not on file  Stress: Not on file  Social Connections: Not on file  Intimate Partner Violence: Not on file    Physical Exam: Ht _0  (1.676 m)    Wt 124 lb 12.8 oz (56.6 kg)    LMP 09/07/2020    BMI 20.14 kg/m  Constitutional: generally well-appearing Psychiatric: alert and oriented x3 Abdomen: soft, mildly tender midepigastrium nondistended, no obvious ascites, no peritoneal signs, normal bowel sounds No peripheral edema noted in lower extremities  Assessment and plan: 46 y.o. female with myriad upper and lower GI symptoms including epigastric pain, reflux, dysphagia, nausea, constipation  Unclear etiology.  She is a bit tender on exam.  I recommended testing to try to help sort this out and I am starting with CT scan abdomen pelvis with IV and oral contrast.  She will also get basic set of labs today including CBC and complete metabolic profile.  I do suspect acid is at least playing somewhat of a role and I am prescribing her omeprazole, 40 mg pills and I have instructed her to take 1 pill shortly before her breakfast meal every day.  She is also very bothered by constipation I explained that this might cause some of her abdominal discomforts.  I do not really think  laxatives are good long-term solution for this and so I asked her to try fiber supplements Citrucel for now.  She currently has an appointment with Ellouise Newer next week and she knows to keep that appointment.  She will stay assigned to Dr. Rush Landmark because that is way our system works.  Please see the "Patient Instructions" section for addition details about the plan.  Owens Loffler, MD [facility] 11/10/2021, 10:26 AM   Total time on date of encounter was 40 minutes (this included time spent preparing to see the patient reviewing records; obtaining and/or reviewing separately obtained history; performing a medically appropriate exam and/or evaluation; counseling and educating the patient and family if present; ordering medications, tests or procedures if applicable; and documenting clinical information in the health record).

## 2021-11-12 ENCOUNTER — Encounter: Payer: Self-pay | Admitting: Cardiology

## 2021-11-13 ENCOUNTER — Other Ambulatory Visit: Payer: Self-pay

## 2021-11-13 ENCOUNTER — Ambulatory Visit (INDEPENDENT_AMBULATORY_CARE_PROVIDER_SITE_OTHER)
Admission: RE | Admit: 2021-11-13 | Discharge: 2021-11-13 | Disposition: A | Discharge status: Home / Self Care | Payer: BC Managed Care – PPO | Source: Ambulatory Visit | Attending: Gastroenterology | Admitting: Gastroenterology

## 2021-11-13 DIAGNOSIS — R109 Unspecified abdominal pain: Secondary | ICD-10-CM | POA: Diagnosis not present

## 2021-11-13 DIAGNOSIS — R1013 Epigastric pain: Secondary | ICD-10-CM | POA: Diagnosis not present

## 2021-11-13 DIAGNOSIS — R1033 Periumbilical pain: Secondary | ICD-10-CM | POA: Diagnosis not present

## 2021-11-13 MED ORDER — IOHEXOL 300 MG/ML  SOLN
100.0000 mL | Freq: Once | INTRAMUSCULAR | Status: AC | PRN
  Administered 2021-11-13: 100 mL via INTRAVENOUS

## 2021-11-16 ENCOUNTER — Other Ambulatory Visit: Payer: Self-pay

## 2021-11-16 MED ORDER — PANTOPRAZOLE SODIUM 40 MG PO TBEC: 40.0000 mg | DELAYED_RELEASE_TABLET | Freq: Every day | ORAL | 3 refills | Status: DC

## 2021-11-16 NOTE — Progress Notes (Signed)
Patient notified of small "cyst" low in her pelvis and that Dr Rush Landmark recommends a pelvic ultrasound.  Patient has an appointment scheduled for 11-17-21 with Dr Cletis Media [phone] and requested that CT scan been sent to her office.  I spoke with Mickel Baas at Dr Boyd Kerbs office regarding which pelvic ultrasound should be ordered and she stated that would take care of the imagining at [place] after reviewing CT scan.  [place] is aware that Dr Boyd Kerbs office will set up ultrasound of pelvis and send results to our office.

## 2021-11-17 DIAGNOSIS — R109 Unspecified abdominal pain: Secondary | ICD-10-CM | POA: Diagnosis not present

## 2021-11-17 DIAGNOSIS — N83201 Unspecified ovarian cyst, right side: Secondary | ICD-10-CM | POA: Diagnosis not present

## 2021-11-17 DIAGNOSIS — K219 Gastro-esophageal reflux disease without esophagitis: Secondary | ICD-10-CM | POA: Diagnosis not present

## 2021-11-17 DIAGNOSIS — N83209 Unspecified ovarian cyst, unspecified side: Secondary | ICD-10-CM | POA: Diagnosis not present

## 2021-11-17 DIAGNOSIS — K5900 Constipation, unspecified: Secondary | ICD-10-CM | POA: Diagnosis not present

## 2021-11-18 DIAGNOSIS — N83201 Unspecified ovarian cyst, right side: Secondary | ICD-10-CM | POA: Diagnosis not present

## 2021-11-18 DIAGNOSIS — R109 Unspecified abdominal pain: Secondary | ICD-10-CM | POA: Diagnosis not present

## 2021-11-19 ENCOUNTER — Ambulatory Visit: Payer: BC Managed Care – PPO | Admitting: Physician Assistant

## 2021-11-20 ENCOUNTER — Telehealth: Payer: Self-pay

## 2021-11-20 NOTE — Telephone Encounter (Signed)
Pantoprazole 40mg  1 tablet daily approved 11-20-2021 to 11-19-2022. Pharmacy aware.

## 2021-12-25 ENCOUNTER — Encounter: Payer: Self-pay | Admitting: Gastroenterology

## 2021-12-25 ENCOUNTER — Ambulatory Visit: Payer: BC Managed Care – PPO | Admitting: Gastroenterology

## 2021-12-25 ENCOUNTER — Other Ambulatory Visit (INDEPENDENT_AMBULATORY_CARE_PROVIDER_SITE_OTHER): Payer: BC Managed Care – PPO

## 2021-12-25 VITALS — BP 120/80 | HR 98 | Ht 66.0 in | Wt 123.0 lb

## 2021-12-25 DIAGNOSIS — R1084 Generalized abdominal pain: Secondary | ICD-10-CM

## 2021-12-25 DIAGNOSIS — K219 Gastro-esophageal reflux disease without esophagitis: Secondary | ICD-10-CM

## 2021-12-25 DIAGNOSIS — R1030 Lower abdominal pain, unspecified: Secondary | ICD-10-CM

## 2021-12-25 DIAGNOSIS — R110 Nausea: Secondary | ICD-10-CM | POA: Diagnosis not present

## 2021-12-25 DIAGNOSIS — Z8719 Personal history of other diseases of the digestive system: Secondary | ICD-10-CM

## 2021-12-25 LAB — LIPASE: Lipase: 8 U/L — ABNORMAL LOW (ref 11.0–59.0)

## 2021-12-25 LAB — TSH: TSH: 0.7 u[IU]/mL (ref 0.35–5.50)

## 2021-12-25 LAB — AMYLASE: Amylase: 27 U/L (ref 27–131)

## 2021-12-25 MED ORDER — PANTOPRAZOLE SODIUM 40 MG PO TBEC: 40.0000 mg | DELAYED_RELEASE_TABLET | Freq: Two times a day (BID) | ORAL | 3 refills | Status: DC

## 2021-12-25 NOTE — Progress Notes (Signed)
GASTROENTEROLOGY OUTPATIENT CLINIC VISIT   Primary Care Provider Pcp, No No address on file None   Patient Profile: Lindsay Giles is a 46 y.o. female with a pmh significant for ADD, anxiety, depression, insomnia, GERD (without esophagitis), hiatal hernia, hemorrhoids.  The patient presents to the [facility] for an evaluation and management of problem(s) noted below:  Problem List 1. Gastroesophageal reflux disease without esophagitis   2. Lower abdominal pain   3. Generalized abdominal pain   4. Nausea without vomiting   5. History of constipation     History of Present Illness Please see prior notes for full details of HPI.  Interval History The patient comes in for a follow-up.  I had met the patient for procedures back in 2021.  She began to experience other issues and was seen by my partner Dr. Ardis Hughs in January.  Laboratories as well as a CT scan were ordered.  CT scan was unremarkable from a GI perspective but showed some potential ovarian cysts.  Patient is followed up with her gynecologist and is not felt that her symptoms are GYN related.  Patient was prescribed omeprazole but could not get that so she was transitioned to Protonix.  She relates that when she used to be on Dexilant her GERD symptoms were much improved.  She takes Protonix once daily in the morning before a meal.  She uses Zofran on an as-needed basis but tries not to use it due to it causing her to develop constipation.  She is not currently taking Pepcid since she is on Protonix.  She still has symptoms of GERD with Protonix once daily.  She had infrequent episodes of dysphagia though that is not bothersome for her currently.  She believes that within the last few days she may have had a ovarian cyst rupture as she had significant discomfort in her right lower quadrant but that has proved back to her more significant generalized and lower abdominal pain.  She moves her bowels on a daily  basis currently and believes that has improved some of her symptoms.  She has abdominal distention.  She feels that from a mental health perspective things are getting better because her daughter is finally back at home after 100-day hospital stay at the end of 2022 into 2023.  Her weight has been stable overall.  She has bloating at times but it is not her most symptoms.  Her psychiatrist is in [place].  GI Review of Systems Positive as above Negative for odynophagia, anorexia, melena, hematochezia  Review of Systems General: Denies fevers/chills/weight loss unintentionally HEENT: Denies oral lesions Cardiovascular: Has had some palpitations and has reach out to cardiology whom she has seen previously; denies chest pain Pulmonary: Denies shortness of breath Gastroenterological: See HPI Genitourinary: Denies darkened urine Hematological: Denies easy bruising/bleeding Endocrine: Denies temperature intolerance Dermatological: Denies jaundice Psychological: Mood is stable   Medications Current Outpatient Medications  Medication Sig Dispense Refill   clonazePAM (KLONOPIN) 0.5 MG tablet Take 0.5 mg by mouth 2 (two) times daily as needed for anxiety.     Cyanocobalamin (VITAMIN B-12) 5000 MCG TBDP Take 5,000 mcg by mouth daily.     desvenlafaxine (PRISTIQ) 100 MG 24 hr tablet Take 100 mg by mouth at bedtime.      Docusate Calcium (STOOL SOFTENER PO) Take 1 capsule by mouth daily. *takes "stool softener" daily, takes additional docusate as needed     lisdexamfetamine (VYVANSE) 40 MG capsule Take 1 capsule (  40 mg total) by mouth every morning. 30 capsule 0   methylcellulose (CITRUCEL) oral powder Take 1 packet by mouth daily.     Omega-3 Fatty Acids (FISH OIL PO) Take 1 capsule by mouth daily.     ondansetron (ZOFRAN) 8 MG tablet Take 1 tablet (8 mg total) by mouth daily as needed for nausea. 20 tablet 0   traZODone (DESYREL) 100 MG tablet Take 200 mg by mouth at bedtime.       VITAMIN D PO Take 3 capsules by mouth daily.     pantoprazole (PROTONIX) 40 MG tablet Take 1 tablet (40 mg total) by mouth 2 (two) times daily before a meal. 90 tablet 3   No current facility-administered medications for this visit.    Allergies Allergies  Allergen Reactions   Atrovent [Ipratropium] Other (See Comments)    Uncontrollable nosebleeds (nasal spray)   Morphine And Related Itching and Other (See Comments)    Ineffective/itching    Histories Past Medical History:  Diagnosis Date   Abnormal Pap smear    ADD (attention deficit disorder)    Anemia    Anxiety and depression    Complication of anesthesia    Dysrhythmia    Gastritis    Per patient   GERD (gastroesophageal reflux disease)    Menorrhagia    Pneumonia    PONV (postoperative nausea and vomiting)    Rapid heart rate    Tachycardia    Past Surgical History:  Procedure Laterality Date   BILATERAL SALPINGECTOMY Bilateral 10/13/2020   Procedure: BILATERAL SALPINGECTOMY;  Surgeon: Delsa Bern, MD;  Location: [place];  Service: Gynecology;  Laterality: Bilateral;  VAGINAL   BREAST REDUCTION SURGERY     CERVICAL ABLATION     pt denies having had d&c   DILATION AND CURETTAGE OF UTERUS     pt stateds did not have d & c , but rather a carvical ablation   ESOPHAGEAL DILATION  08/2020   EXAMINATION UNDER ANESTHESIA N/A 02/17/2014   Procedure: EXAM UNDER ANESTHESIA,  ULTRASOUND OF RECTUM, INCISION AND EXCISION OF THROMBOSED CLOT ;  Surgeon: Shann Medal, MD;  Location: [facility];  Service: General;  Laterality: N/A;   KNEE SURGERY     right hand surgery     neuroma & groth on bone removed    TONSILLECTOMY AND ADENOIDECTOMY     VAGINAL HYSTERECTOMY N/A 10/13/2020   Procedure: HYSTERECTOMY VAGINAL;  Surgeon: Delsa Bern, MD;  Location: [place];  Service: Gynecology;  Laterality: N/A;   Social History   Socioeconomic History   Marital status: Married    Spouse  name: Not on file   Number of children: Not on file   Years of education: Not on file   Highest education level: Not on file  Occupational History   Not on file  Tobacco Use   Smoking status: Former    Types: Cigarettes   Smokeless tobacco: Never   Tobacco comments:    Social smoker  Vaping Use   Vaping Use: Never used  Substance and Sexual Activity   Alcohol use: Yes    Comment: Occasionally   Drug use: No   Sexual activity: Yes    Partners: Male    Birth control/protection: Surgical    Comment: vas   Other Topics Concern   Not on file  Social History Narrative   Daughter of Dr. 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[employer] Determinants  of Health   Financial Resource Strain: Not on file  Food Insecurity: Not on file  Transportation Needs: Not on file  Physical Activity: Not on file  Stress: Not on file  Social Connections: Not on file  Intimate Partner Violence: Not on file   Family History  Problem Relation Age of Onset   Hypothyroidism Mother    Breast cancer Paternal Grandmother    Cancer Paternal Grandmother 49       breast   Arrhythmia Paternal Grandmother    Hyperlipidemia Paternal Grandmother    Angina Paternal Grandmother    Stroke Paternal Grandmother    Diabetes Son    Colon cancer Neg Hx    Colon polyps Neg Hx    Esophageal cancer Neg Hx    Stomach cancer Neg Hx    Rectal cancer Neg Hx    Inflammatory bowel disease Neg Hx    Liver disease Neg Hx    Pancreatic cancer Neg Hx    I have reviewed her medical, social, and family history in detail and updated the electronic medical record as necessary.    PHYSICAL EXAMINATION  BP 120/80    Pulse 98    Ht '5\' 6"'  (1.676 m)    Wt 123 lb (55.8 kg)    LMP 09/07/2020    BMI 19.85 kg/m  Wt Readings from Last 3 Encounters:  12/25/21 123 lb (55.8 kg)  11/10/21 124 lb 12.8 oz (56.6 kg)  04/17/21 129 lb 6.4 oz (58.7 kg)  GEN: NAD, appears stated age, doesn't  appear chronically ill PSYCH: Cooperative, without pressured speech EYE: Conjunctivae pink, sclerae anicteric ENT: MMM CV: Nontachycardic RESP: No audible wheezing GI: NABS, soft, TTP in lower quadrants bilaterally, without rebound or guarding, no hepatosplenomegaly appreciated MSK/EXT: No lower extremity edema SKIN: No jaundice NEURO:  Alert & Oriented x 3, no focal deficits   REVIEW OF DATA  I reviewed the following data at the time of this encounter:  GI Procedures and Studies  November 2021 colonoscopy - Hemorrhoids found on digital rectal exam. - The examined portion of the ileum was normal. - Normal mucosa in the entire examined colon. - Non-bleeding non-thrombosed external and internal hemorrhoids.  November 2021 EGD - No gross lesions in esophagus. Biopsied. Dilated. - 2 cm hiatal hernia. - No other gross lesions in the stomach. Biopsied. - No gross lesions in the duodenal bulb, in the first portion of the duodenum and in the second portion of the duodenum. Biopsied.  Pathology Diagnosis 1. Surgical [P], distal duodenum - BENIGN SMALL BOWEL MUCOSA. - NO VILLOUS BLUNTING OR INCREASE IN INTRAEPITHELIAL LYMPHOCYTES. - NO DYSPLASIA OR MALIGNANCY. 2. Surgical [P], random gastric sites - MILD CHRONIC GASTRITIS. - WARTHIN-STARRY IS NEGATIVE FOR HELICOBACTER PYLORI. - NO INTESTINAL METAPLASIA, DYSPLASIA, OR MALIGNANCY. 3. Surgical [P], random esophageal sites - BENIGN SQUAMOUS MUCOSA. - NO INCREASE IN INTRAEPITHELIAL EOSINOPHILS. - NO INTESTINAL METAPLASIA, DYSPLASIA, OR MALIGNANCY.  Laboratory Studies  Reviewed those in epic.  Imaging Studies  January 2023 CT abdomen pelvis with contrast IMPRESSION: 1. No acute abnormality identified in the abdomen or pelvis. 2. Mild hepatomegaly. 3. 3.5 cm hypodense cystic mass in the midline pelvis abutting the vaginal cuff. Could represent a right ovarian cyst, or postoperative seroma/inclusion cyst. Appears enlarged since  previous study. Consider follow-up ultrasound or MRI as indicated.   ASSESSMENT  Ms. Sweatman is a 46 y.o. female with a pmh significant for ADD, anxiety, depression, insomnia, GERD (without esophagitis), hiatal hernia, hemorrhoids.  The patient is  seen today for evaluation and management of:  1. Gastroesophageal reflux disease without esophagitis   2. Lower abdominal pain   3. Generalized abdominal pain   4. Nausea without vomiting   5. History of constipation    The patient is hemodynamically stable.  Clinically she is improved in regards to her pyrosis symptoms but her lower abdominal discomfort persists.  We are going to increase her PPI for a period of time to twice daily dosing.  If she has good effectiveness with this medication up titration then at some point she may benefit from a consideration of fundoplication or TIF or other [place] medical management of GERD.  We will have to be thoughtful because she has had issues with dysphagia and so would need to consider manometry testing before any type of intervention however.  In regards to her abdominal discomforts I suspect that these are most likely functional in the setting of a negative work-up, up-to-date at this time.  With that being said we are going to do further evaluation and rule out of other etiologies.  We will see how she feels with the increased PPI titration.  We were going to consider SIBO breath testing as well in the near future.  I would like to consider the use of a potentially a TCA, however with her underlying MDD/anxiety, we would have to get approval from her psychiatrist to consider low-dose amitriptyline or desipramine at night and titrate upwards, if we got approval.  Can consider Levsin or Bentyl in the future though not clear that these will be effective for her.  May consider FDgard as well in the future.  All patient questions were answered to the best of my ability, and the patient agrees to the aforementioned plan  of action with follow-up as indicated.    PLAN  Laboratories as outlined below Stool studies as outlined below Consider SIBO breath testing in the near future Increase PPI to twice daily If constipation recurs/worsens initiate Linzess therapy or Amitiza or Trulance therapy Continue fiber intake daily (try dietary as well as supplemental) Transmit note to psychiatrist to see if consideration of low-dose amitriptyline or desipramine may be possible to be added to her regimen at night for neuromodulation from a GI tract perspective Consider FDgard in future Consider Bentyl/Levsin Future   Orders Placed This Encounter  Procedures   Amylase   Lipase   TSH   Cortisol   Fecal fat, quantitative   Pancreatic elastase, fecal    New Prescriptions   No medications on file   Modified Medications   Modified Medication Previous Medication   PANTOPRAZOLE (PROTONIX) 40 MG TABLET pantoprazole (PROTONIX) 40 MG tablet      Take 1 tablet (40 mg total) by mouth 2 (two) times daily before a meal.    Take 1 tablet (40 mg total) by mouth daily.    Planned Follow Up No follow-ups on file.   Total Time in Face-to-Face and in Coordination of Care for patient including independent/personal interpretation/review of prior testing, medical history, examination, medication adjustment, communicating results with the patient directly, and documentation within the EHR is 30 minutes.   Justice Britain, MD [facility] Office # [phone]

## 2021-12-25 NOTE — Patient Instructions (Signed)
Your provider has requested that you go to the basement level for lab work before leaving today. Press "B" on the elevator. The lab is located at the first door on the left as you exit the elevator. ? ?We have sent the following medications to your pharmacy for you to pick up at your convenience: ?Pantoprazole  ? ? ?Increase your Pantoprazole to 40mg  - Take 1 tablet by mouth twice daily.  ? ?If you are age 44 or older, your body mass index should be between 23-30. Your Body mass index is 19.85 kg/m?Marland Kitchen If this is out of the aforementioned range listed, please consider follow up with your Primary Care Provider. ? ?If you are age 87 or younger, your body mass index should be between 19-25. Your Body mass index is 19.85 kg/m?Marland Kitchen If this is out of the aformentioned range listed, please consider follow up with your Primary Care Provider.  ? ?________________________________________________________ ? ?The [facility] providers would like to encourage you to use [facility] to communicate with providers for non-urgent requests or questions.  Due to long hold times on the telephone, sending your provider a message by [facility] may be a faster and more efficient way to get a response.  Please allow 48 business hours for a response.  Please remember that this is for non-urgent requests.  ?_______________________________________________________ ? ?Thank you for choosing me and [facility]. ? ?Dr. Rush Landmark ? ? ?

## 2021-12-27 ENCOUNTER — Encounter: Payer: Self-pay | Admitting: Gastroenterology

## 2021-12-27 DIAGNOSIS — Z8719 Personal history of other diseases of the digestive system: Secondary | ICD-10-CM | POA: Insufficient documentation

## 2021-12-27 DIAGNOSIS — K219 Gastro-esophageal reflux disease without esophagitis: Secondary | ICD-10-CM | POA: Insufficient documentation

## 2021-12-27 DIAGNOSIS — R1030 Lower abdominal pain, unspecified: Secondary | ICD-10-CM | POA: Insufficient documentation

## 2021-12-27 DIAGNOSIS — R102 Pelvic and perineal pain unspecified side: Secondary | ICD-10-CM | POA: Insufficient documentation

## 2021-12-27 DIAGNOSIS — R1084 Generalized abdominal pain: Secondary | ICD-10-CM | POA: Insufficient documentation

## 2021-12-27 DIAGNOSIS — R110 Nausea: Secondary | ICD-10-CM | POA: Insufficient documentation

## 2021-12-29 ENCOUNTER — Other Ambulatory Visit: Payer: BC Managed Care – PPO

## 2021-12-29 DIAGNOSIS — K219 Gastro-esophageal reflux disease without esophagitis: Secondary | ICD-10-CM

## 2021-12-29 DIAGNOSIS — R1030 Lower abdominal pain, unspecified: Secondary | ICD-10-CM

## 2022-01-04 ENCOUNTER — Encounter: Payer: BC Managed Care – PPO | Admitting: Internal Medicine

## 2022-01-06 ENCOUNTER — Telehealth: Payer: Self-pay | Admitting: Gastroenterology

## 2022-01-06 LAB — FECAL FAT, QUANTITATIVE: Specimen Total Weight: 25 g

## 2022-01-06 LAB — PANCREATIC ELASTASE, FECAL: Pancreatic Elastase-1, Stool: >500 mcg/g

## 2022-01-06 NOTE — Telephone Encounter (Signed)
Lab results have a MyChart message on them. ?The 3/7 stool studies have not resulted. ?Thanks. ?GM ?

## 2022-01-06 NOTE — Telephone Encounter (Signed)
Patient called and stated that her labs were in [place], requesting a call back to discuss. Please advise.  ?

## 2022-01-06 NOTE — Telephone Encounter (Signed)
The pt was advised that the stool testing has not been reviewed.  The labs have been and the results sent to her via My Chart.  I advised her again that as soon as the stool test have resulted we will contact her.  The pt has been advised of the information and verbalized understanding.    ?

## 2022-01-06 NOTE — Telephone Encounter (Signed)
Inbound call from patient requesting lab results from labs done on 3/7. Please advise.  ?

## 2022-01-06 NOTE — Telephone Encounter (Signed)
The pt has been advised that the stool test has not resulted.  We will call her as soon as able.  ?

## 2022-01-06 NOTE — Telephone Encounter (Signed)
Dr Rush Landmark have you seen the results from 3/7? ?

## 2022-01-08 ENCOUNTER — Encounter: Payer: BC Managed Care – PPO | Admitting: Internal Medicine

## 2022-01-18 DIAGNOSIS — F909 Attention-deficit hyperactivity disorder, unspecified type: Secondary | ICD-10-CM | POA: Diagnosis not present

## 2022-01-18 DIAGNOSIS — F411 Generalized anxiety disorder: Secondary | ICD-10-CM | POA: Diagnosis not present

## 2022-01-18 DIAGNOSIS — F332 Major depressive disorder, recurrent severe without psychotic features: Secondary | ICD-10-CM | POA: Diagnosis not present

## 2022-02-07 IMAGING — CT CT ABD-PELV W/ CM
2 of 5 series · 16 of 46 positions shown, 18 images · IV contrast (OMNIPAQUE 300)
Comparison: CT abdomen and pelvis 12/03/2020

CLINICAL DATA: Periumbilical pain

EXAM:
CT ABDOMEN AND PELVIS WITH CONTRAST
TECHNIQUE: Multidetector CT imaging of the abdomen and pelvis was performed
using the standard protocol following bolus administration of
intravenous contrast.

[Series 2: abd/pel w · axial · 0.68mm/px · z∈[-443,-58]mm · 13 of 87 slices shown, 15 images]
[im 5/87  soft-tissue]
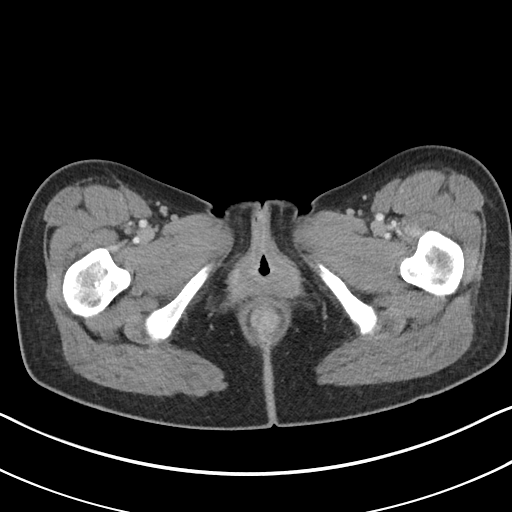
[im 5/87  bone]
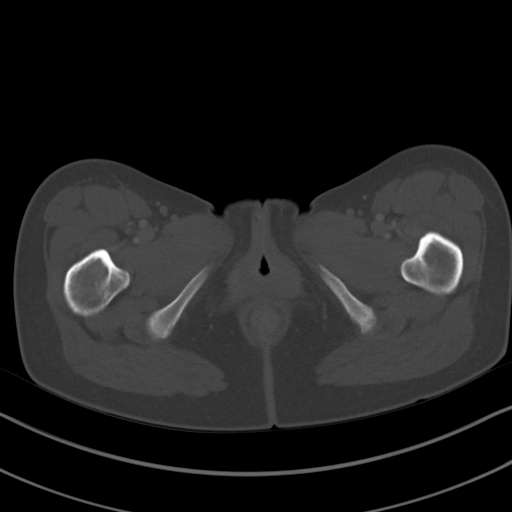
[im 13/87  soft-tissue]
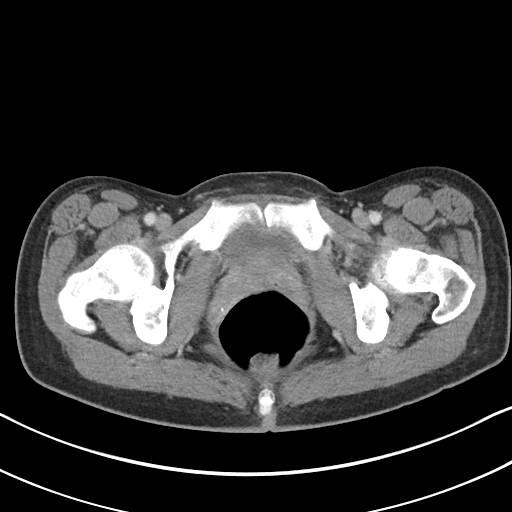
[im 18/87  soft-tissue]
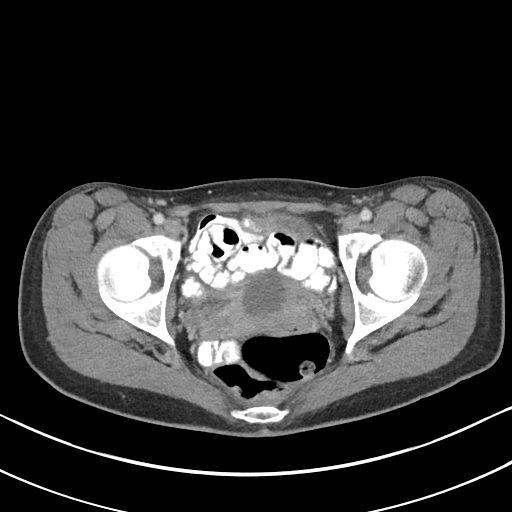
[im 26/87  soft-tissue]
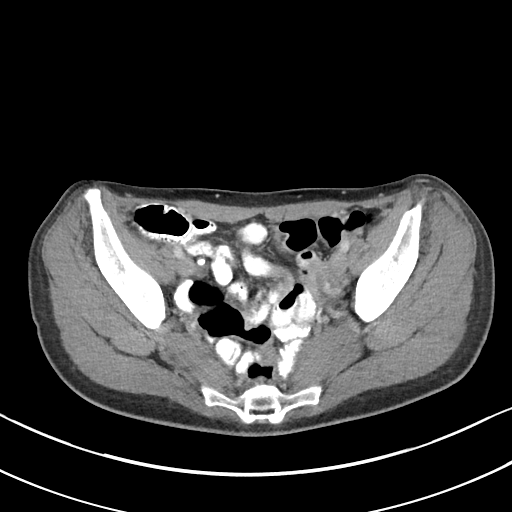
[im 31/87  soft-tissue]
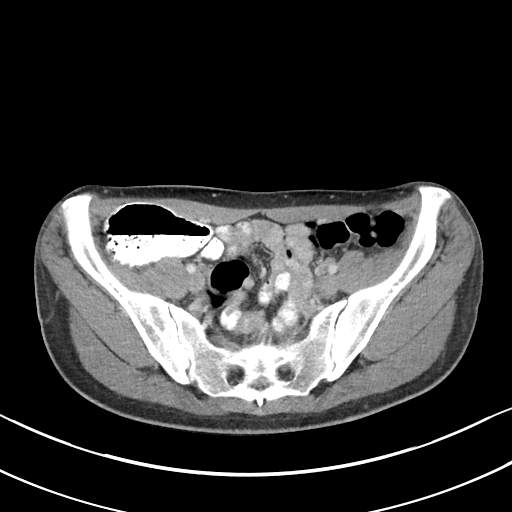
[im 39/87  soft-tissue]
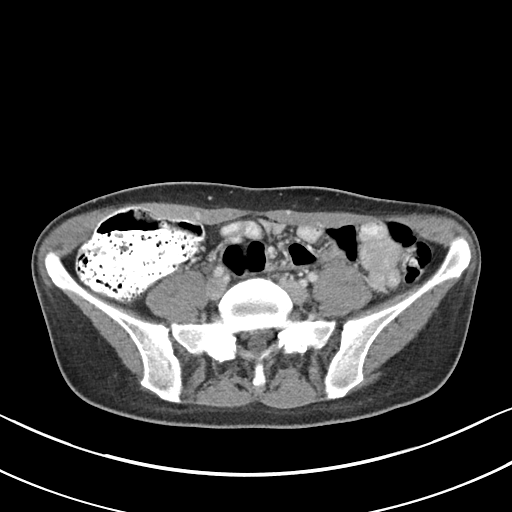
[im 44/87  soft-tissue]
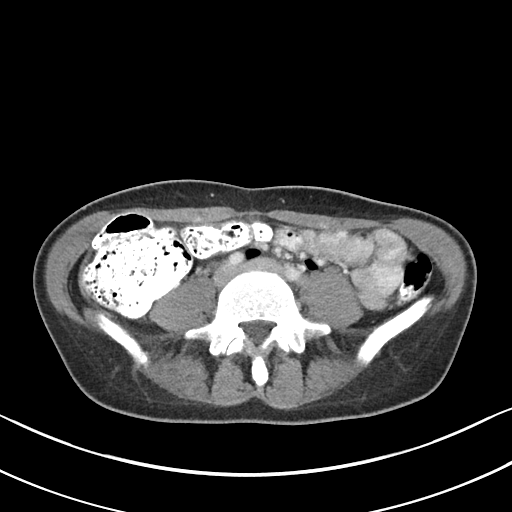
[im 48/87  soft-tissue]
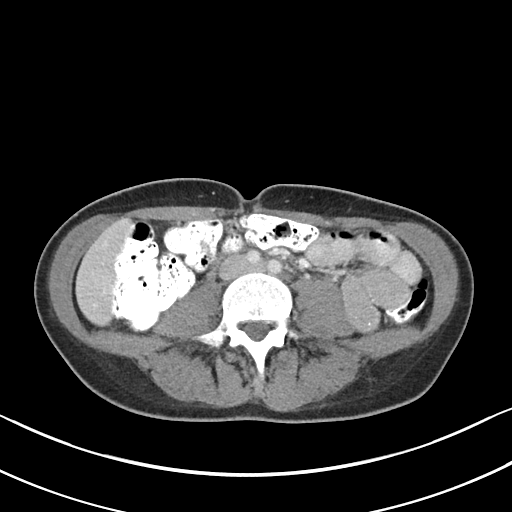
[im 56/87  soft-tissue]
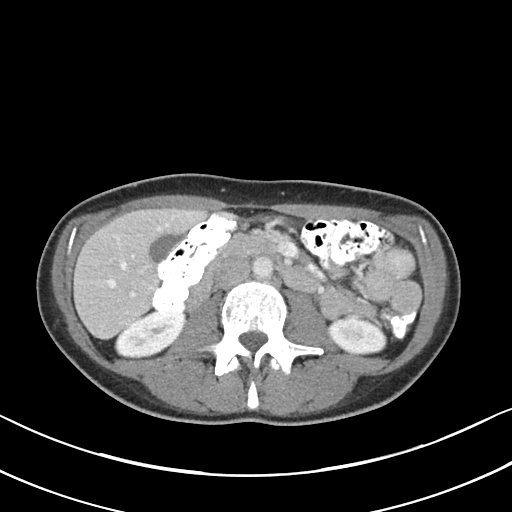
[im 56/87  bone]
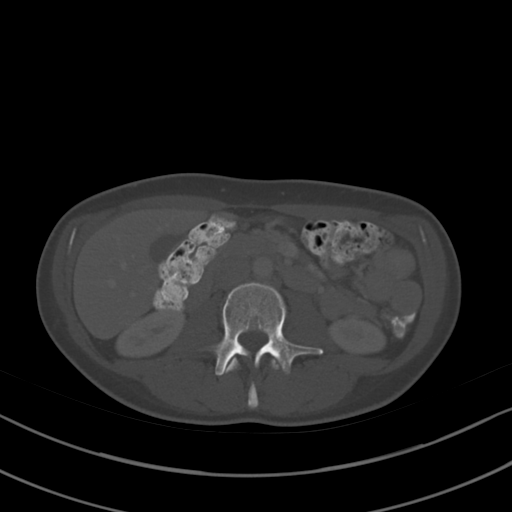
[im 61/87  soft-tissue]
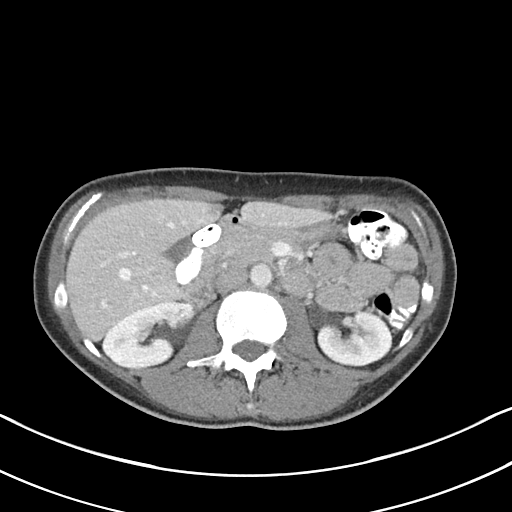
[im 69/87  soft-tissue]
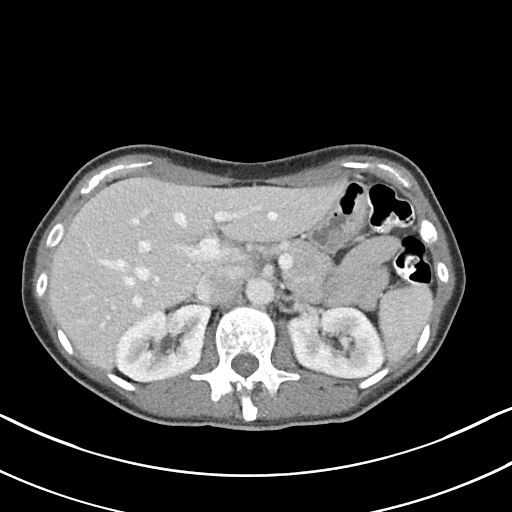
[im 74/87  soft-tissue]
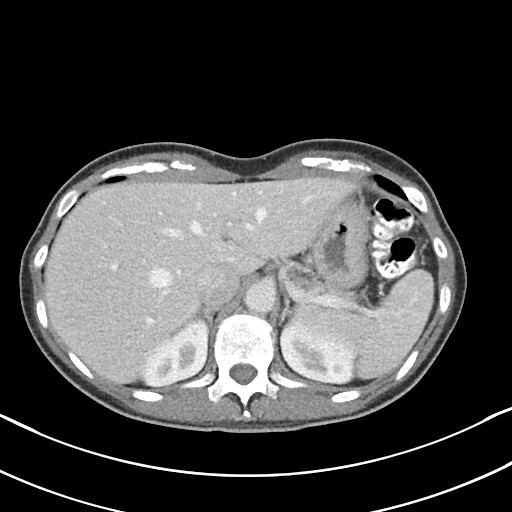
[im 82/87  soft-tissue]
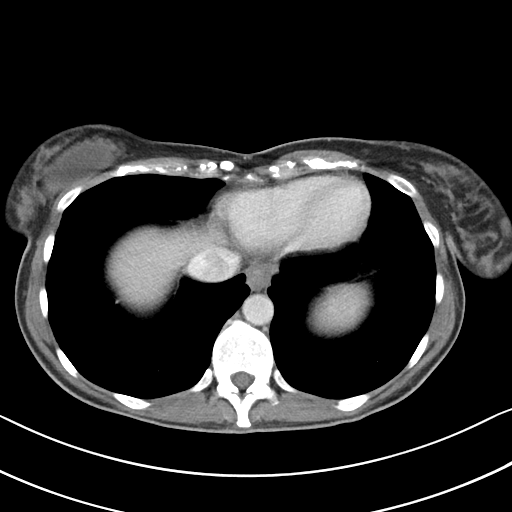

[Series 5: coronal st · coronal · 0.67mm/px · 3 of 58 slices shown]
[im 20/58  soft-tissue]
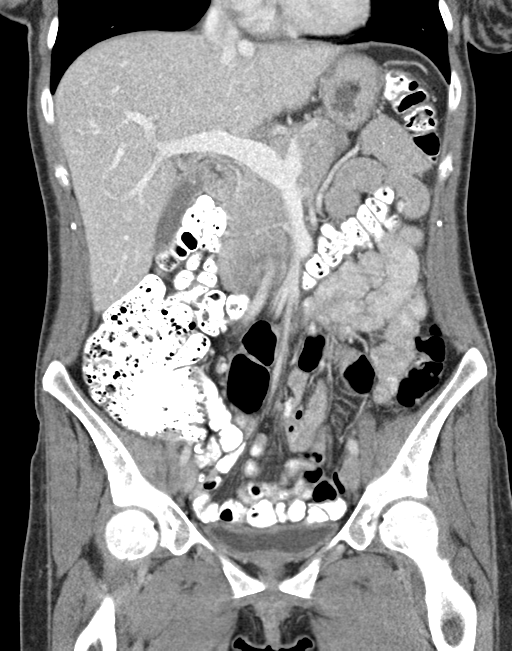
[im 26/58  soft-tissue]
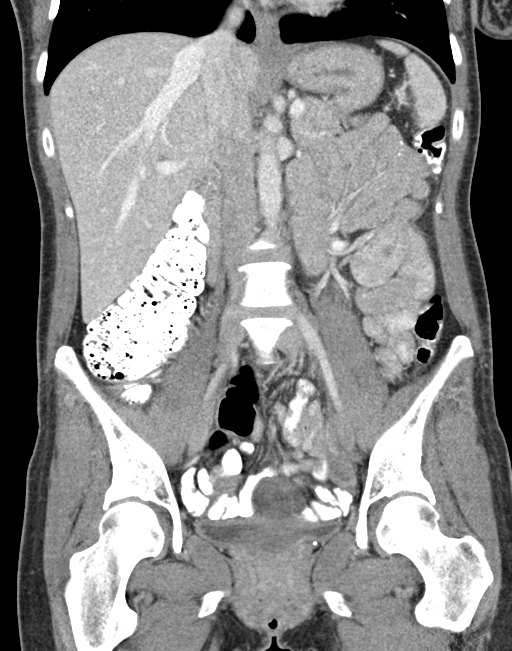
[im 32/58  soft-tissue]
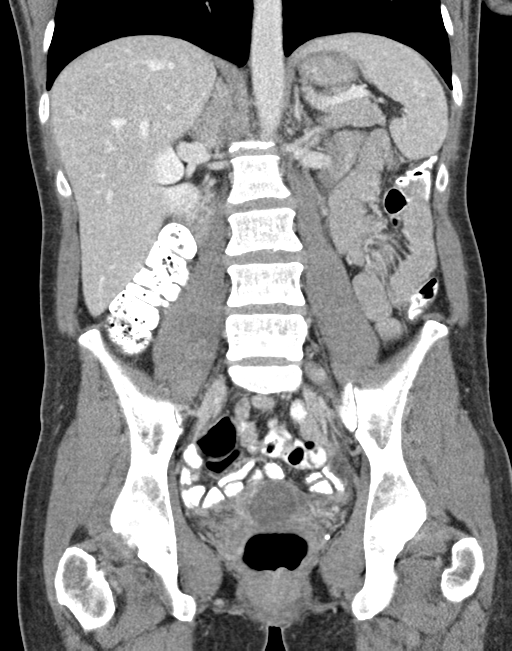

[16 of 46 positions shown; findings below may reference images not displayed]

RADIATION DOSE REDUCTION: This exam was performed according to the
departmental dose-optimization program which includes automated
exposure control, adjustment of the mA and/or kV according to
patient size and/or use of iterative reconstruction technique.

CONTRAST:  100mL OMNIPAQUE IOHEXOL 300 MG/ML  SOLN
FINDINGS: Lower chest: No acute abnormality.

Hepatobiliary: Liver is enlarged measuring 19.4 cm in length. No
suspicious hepatic mass identified. Gallbladder appears normal. No
biliary ductal dilatation identified.

Pancreas: Unremarkable. No pancreatic ductal dilatation or
surrounding inflammatory changes.

Spleen: Normal in size without focal abnormality.

Adrenals/Urinary Tract: Adrenal glands appear normal. Bilateral
extrarenal pelvises are noted. Kidneys appear otherwise normal.
Urinary bladder is normal.

Stomach/Bowel: No bowel obstruction, free air or pneumatosis. No
bowel wall edema identified. Appendix is normal.

Vascular/Lymphatic: No significant vascular findings are present. No
enlarged abdominal or pelvic lymph nodes.

Reproductive: Status post hysterectomy. There is a round hypodense
cystic mass in the midline pelvis which abuts the anterior aspect of
the vaginal cuff and measures 3.5 x 3.3 x 3.1 cm. There was a
smaller cystic lesion in a similar location on previous study.

Other: No ascites.

Musculoskeletal: No suspicious bony lesions.
IMPRESSION: 1. No acute abnormality identified in the abdomen or pelvis.
2. Mild hepatomegaly.
3. 3.5 cm hypodense cystic mass in the midline pelvis abutting the
vaginal cuff. Could represent a right ovarian cyst, or postoperative
seroma/inclusion cyst. Appears enlarged since previous study.
Consider follow-up ultrasound or MRI as indicated.

## 2022-02-22 NOTE — Progress Notes (Incomplete)
?Cardiology Office Note:   ? ?Date:  02/22/2022  ? ?ID:  Lindsay Giles, DOB 1976-07-09, MRN [id number] ? ?PCP:  Pcp, No  ?[facility] Cardiologist:  Candee Furbish, MD  ?[place] Electrophysiologist:  None  ? ?Referring MD: No ref. provider found  ? ? ?History of Present Illness:   ? ?Lindsay Giles is a 46 y.o. female here for the evaluation of palpitations at the request of Lindsay Giles, [place]. ? ?I had seen her during hospitalization on 07/11/2020 for chest discomfort.  She was sitting at home at the office and had sudden onset chest pain centralized mid chest crying and anxious got nauseous sweating right neck and jaw.  Felt like a bubble.  Went to lay down heart rate was elevated in the 130s.  She occasionally smokes.  No early family history of CAD.  Troponin was 20 and 21.  Chest x-ray was normal.  Telemetry showed no adverse arrhythmias.  EKG showed sinus rhythm 64. ? ?Coronary CT scan was performed on 07/12/2020 which was normal.  No evidence of CAD.  2 small pulmonary nodules were noted and encouraged follow-up. ? ?In review of prior office note from 08/08/2020, she was initially evaluated on 07/23/2020 with complaints of ongoing acid reflux and tachycardia.  She was restarted on pantoprazole.  Labs regarding tachycardia and fatigue were obtained and unremarkable except for mild hypokalemia at 3.4.  She had a Holter monitor study.  Which showed:  ? ?The basic rhythm is normal sinus with an average HR of 97 (57-180) bpm ?No atrial fibrillation or flutter ?No high-grade heart block or pathologic pauses ?There are rare PVC's and rare supraventricular beats without sustained arrhythmias ?5.   There are periods of ectopic atrial rhythm present ? ?Heart rates have been up as high as 140 at home.  This can be at rest or with exertion.  Prior lowest heart rate was in the 90s.  No chest pain but occasionally will feel some tightness in her chest when she is anxious when her heart rate is up.  No syncope.   GERD.  She is also on stimulant type medication Vyvanse.  ADHD. ? ?Extensive lab work reviewed.  TSH normal.  Sed rate 1.  LDL 146 magnesium 1.8 ? ?Overall she is feeling better with Toprol-XL 25 mg.  Her heart rate today for instance was 70 bpm.  She feels as though this is helping her quite a bit. ? ?Past Medical History:  ?Diagnosis Date  ? Abnormal Pap smear   ? ADD (attention deficit disorder)   ? Anemia   ? Anxiety and depression   ? Complication of anesthesia   ? Dysrhythmia   ? Gastritis   ? Per patient  ? GERD (gastroesophageal reflux disease)   ? Menorrhagia   ? Pneumonia   ? PONV (postoperative nausea and vomiting)   ? Rapid heart rate   ? Tachycardia   ? ? ?Past Surgical History:  ?Procedure Laterality Date  ? BILATERAL SALPINGECTOMY Bilateral 10/13/2020  ? Procedure: BILATERAL SALPINGECTOMY;  Surgeon: Delsa Bern, MD;  Location: [facility];  Service: Gynecology;  Laterality: Bilateral;  VAGINAL  ? BREAST REDUCTION SURGERY    ? CERVICAL ABLATION    ? pt denies having had d&c  ? DILATION AND CURETTAGE OF UTERUS    ? pt stateds did not have d & c , but rather a carvical ablation  ? ESOPHAGEAL DILATION  08/2020  ? EXAMINATION UNDER ANESTHESIA N/A 02/17/2014  ?  Procedure: EXAM UNDER ANESTHESIA,  ULTRASOUND OF RECTUM, INCISION AND EXCISION OF THROMBOSED CLOT ;  Surgeon: Shann Medal, MD;  Location: [facility];  Service: General;  Laterality: N/A;  ? KNEE SURGERY    ? right hand surgery    ? neuroma & groth on bone removed   ? TONSILLECTOMY AND ADENOIDECTOMY    ? VAGINAL HYSTERECTOMY N/A 10/13/2020  ? Procedure: HYSTERECTOMY VAGINAL;  Surgeon: Delsa Bern, MD;  Location: [facility];  Service: Gynecology;  Laterality: N/A;  ? ? ?Current Medications: ?No outpatient medications have been marked as taking for the 02/23/22 encounter (Appointment) with Jerline Pain, MD.  ?  ? ?Allergies:   Atrovent [ipratropium] and Morphine and related  ? ?Social History  ? ?Socioeconomic History   ? Marital status: Married  ?  Spouse name: Not on file  ? Number of children: Not on file  ? Years of education: Not on file  ? Highest education level: Not on file  ?Occupational History  ? Not on file  ?Tobacco Use  ? Smoking status: Former  ?  Types: Cigarettes  ? Smokeless tobacco: Never  ? Tobacco comments:  ?  Social smoker  ?Vaping Use  ? Vaping Use: Never used  ?Substance and Sexual Activity  ? Alcohol use: Yes  ?  Comment: Occasionally  ? Drug use: No  ? Sexual activity: Yes  ?  Partners: Male  ?  Birth control/protection: Surgical  ?  Comment: vas   ?Other Topics Concern  ? Not on file  ?Social History Narrative  ? Daughter of Dr. 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 ?Social Determinants of Health  ? ?Financial Resource Strain: Not on file  ?Food Insecurity: Not on file  ?Transportation Needs: Not on file  ?Physical Activity: Not on file  ?Stress: Not on file  ?Social Connections: Not on file  ?  ? ?Family History: ?The patient's family history includes Angina in her paternal grandmother; Arrhythmia in her paternal grandmother; Breast cancer in her paternal grandmother; Cancer (age of onset: 65) in her paternal grandmother; Diabetes in her son; Hyperlipidemia in her paternal grandmother; Hypothyroidism in her mother; Stroke in her paternal grandmother. There is no history of Colon cancer, Colon polyps, Esophageal cancer, Stomach cancer, Rectal cancer, Inflammatory bowel disease, Liver disease, or Pancreatic cancer. ? ?ROS:   ?Please see the history of present illness.    ?All other systems reviewed and are negative. ? ?EKGs/Labs/Other Studies Reviewed:   ? ?The following studies were reviewed today: ? ?ECHO 2021: ? ? ? 1. Left ventricular ejection fraction, by estimation, is 60 to 65%. The  ?left ventricle has normal function. The left ventricle has no regional  ?wall motion abnormalities. Left ventricular diastolic parameters were  ?normal.  ? 2. Right  ventricular systolic function is normal. The right ventricular  ?size is normal. Tricuspid regurgitation signal is inadequate for assessing  ?PA pressure.  ? 3. The mitral valve is normal in structure. Trivial mitral valve  ?regurgitation. No evidence of mitral stenosis.  ? 4. The aortic valve is normal in structure. Aortic valve regurgitation is  ?not visualized. No aortic stenosis is present.  ? 5. The inferior vena cava is normal in size with greater than 50%  ?respiratory variability, suggesting right atrial pressure of 3 mmHg.  ? ? ?Recent Labs: ?11/10/2021: ALT 9; BUN 9; Creatinine, Ser 0.64; Hemoglobin 15.5; Platelets 261.0; Potassium 4.1; Sodium 136 ?12/25/2021: TSH 0.70  ?Recent  Lipid Panel ?   ?Component Value Date/Time  ? CHOL 206 (H) 07/12/2020 0537  ? TRIG 85 07/12/2020 0537  ? HDL 43 07/12/2020 0537  ? CHOLHDL 4.8 07/12/2020 0537  ? VLDL 17 07/12/2020 0537  ? LDLCALC 146 (H) 07/12/2020 0537  ? ? ? ? ? ?Physical Exam:   ? ?VS:  LMP 09/07/2020    ? ?Wt Readings from Last 3 Encounters:  ?12/25/21 123 lb (55.8 kg)  ?11/10/21 124 lb 12.8 oz (56.6 kg)  ?04/17/21 129 lb 6.4 oz (58.7 kg)  ?  ? ?GEN:  Well nourished, well developed in no acute distress ?HEENT: Normal ?NECK: No JVD; No carotid bruits ?LYMPHATICS: No lymphadenopathy ?CARDIAC: RRR, no murmurs, rubs, gallops ?RESPIRATORY:  Clear to auscultation without rales, wheezing or rhonchi  ?ABDOMEN: Soft, non-tender, non-distended ?MUSCULOSKELETAL:  No edema; No deformity  ?SKIN: Warm and dry ?NEUROLOGIC:  Alert and oriented x 3 ?PSYCHIATRIC:  Normal affect  ? ?ASSESSMENT:   ? ?No diagnosis found. ? ?PLAN:   ? ?In order of problems listed above: ? ?Tachycardia ?GERD ?ADHD ?-Metoprolol 25 mg XL was given. ?-Average heart rate on monitor was 96 bpm, maximum was 180.  I personally reviewed the monitor findings with her and showed her the grass.  During sleep her heart rate does relax into the 70s.  ?-As was discussed previously, Vyvanse certainly as a stimulant  can increase her heart rate.  Now that she is on the Toprol, she feels better and I would be okay with her continuing the Vyvanse if she needs it. ?-Lab work unremarkable.  She is also seeing rheumatologis

## 2022-02-23 ENCOUNTER — Ambulatory Visit: Payer: BC Managed Care – PPO | Admitting: Cardiology

## 2022-02-26 DIAGNOSIS — L719 Rosacea, unspecified: Secondary | ICD-10-CM | POA: Diagnosis not present

## 2022-03-02 DIAGNOSIS — M62830 Muscle spasm of back: Secondary | ICD-10-CM | POA: Diagnosis not present

## 2022-03-02 DIAGNOSIS — M5416 Radiculopathy, lumbar region: Secondary | ICD-10-CM | POA: Diagnosis not present

## 2022-03-08 ENCOUNTER — Encounter: Payer: BC Managed Care – PPO | Admitting: Internal Medicine

## 2022-04-28 DIAGNOSIS — F909 Attention-deficit hyperactivity disorder, unspecified type: Secondary | ICD-10-CM | POA: Diagnosis not present

## 2022-04-28 DIAGNOSIS — F411 Generalized anxiety disorder: Secondary | ICD-10-CM | POA: Diagnosis not present

## 2022-04-28 DIAGNOSIS — F332 Major depressive disorder, recurrent severe without psychotic features: Secondary | ICD-10-CM | POA: Diagnosis not present

## 2022-05-14 DIAGNOSIS — L719 Rosacea, unspecified: Secondary | ICD-10-CM | POA: Diagnosis not present

## 2022-06-16 DIAGNOSIS — M5459 Other low back pain: Secondary | ICD-10-CM | POA: Diagnosis not present

## 2022-06-16 DIAGNOSIS — M542 Cervicalgia: Secondary | ICD-10-CM | POA: Diagnosis not present

## 2022-06-16 DIAGNOSIS — M545 Low back pain, unspecified: Secondary | ICD-10-CM | POA: Insufficient documentation

## 2022-06-24 ENCOUNTER — Emergency Department (HOSPITAL_COMMUNITY): Payer: BC Managed Care – PPO

## 2022-06-24 ENCOUNTER — Encounter (HOSPITAL_BASED_OUTPATIENT_CLINIC_OR_DEPARTMENT_OTHER): Payer: Self-pay

## 2022-06-24 ENCOUNTER — Other Ambulatory Visit: Payer: Self-pay

## 2022-06-24 ENCOUNTER — Emergency Department (HOSPITAL_BASED_OUTPATIENT_CLINIC_OR_DEPARTMENT_OTHER)
Admission: EM | Admit: 2022-06-24 | Discharge: 2022-06-24 | Disposition: A | Discharge status: Home / Self Care | Payer: BC Managed Care – PPO | Attending: Emergency Medicine | Admitting: Emergency Medicine

## 2022-06-24 DIAGNOSIS — M545 Low back pain, unspecified: Secondary | ICD-10-CM

## 2022-06-24 DIAGNOSIS — M5442 Lumbago with sciatica, left side: Secondary | ICD-10-CM | POA: Diagnosis not present

## 2022-06-24 DIAGNOSIS — M549 Dorsalgia, unspecified: Secondary | ICD-10-CM | POA: Diagnosis not present

## 2022-06-24 DIAGNOSIS — M40204 Unspecified kyphosis, thoracic region: Secondary | ICD-10-CM | POA: Diagnosis not present

## 2022-06-24 DIAGNOSIS — R339 Retention of urine, unspecified: Secondary | ICD-10-CM | POA: Diagnosis not present

## 2022-06-24 DIAGNOSIS — Z87891 Personal history of nicotine dependence: Secondary | ICD-10-CM | POA: Insufficient documentation

## 2022-06-24 LAB — BASIC METABOLIC PANEL
Anion gap: 7 (ref 5–15)
BUN: 13 mg/dL (ref 6–20)
CO2: 26 mmol/L (ref 22–32)
Calcium: 9.4 mg/dL (ref 8.9–10.3)
Chloride: 102 mmol/L (ref 98–111)
Creatinine, Ser: 0.5 mg/dL (ref 0.44–1.00)
GFR, Estimated: >60 mL/min (ref >60)
Glucose, Bld: 108 mg/dL — ABNORMAL HIGH (ref 70–99)
Potassium: 3.5 mmol/L (ref 3.5–5.1)
Sodium: 135 mmol/L (ref 135–145)

## 2022-06-24 LAB — CBC
HCT: 36.8 % (ref 36.0–46.0)
Hemoglobin: 12.9 g/dL (ref 12.0–15.0)
MCH: 31.5 pg (ref 26.0–34.0)
MCHC: 35.1 g/dL (ref 30.0–36.0)
MCV: 89.8 fL (ref 80.0–100.0)
Platelets: 256 10*3/uL (ref 150–400)
RBC: 4.1 MIL/uL (ref 3.87–5.11)
RDW: 12.6 % (ref 11.5–15.5)
WBC: 11 10*3/uL — ABNORMAL HIGH (ref 4.0–10.5)
nRBC: 0 % (ref 0.0–0.2)

## 2022-06-24 LAB — URINALYSIS, ROUTINE W REFLEX MICROSCOPIC
Bilirubin Urine: NEGATIVE
Glucose, UA: NEGATIVE mg/dL
Hgb urine dipstick: NEGATIVE
Ketones, ur: NEGATIVE mg/dL
Leukocytes,Ua: NEGATIVE
Nitrite: NEGATIVE
Protein, ur: NEGATIVE mg/dL
Specific Gravity, Urine: 1.009 (ref 1.005–1.030)
pH: 5.5 (ref 5.0–8.0)

## 2022-06-24 MED ORDER — METHOCARBAMOL 1000 MG/10ML IJ SOLN
1000.0000 mg | Freq: Once | INTRAVENOUS | Status: AC
  Administered 2022-06-24: 1000 mg via INTRAVENOUS
  Filled 2022-06-24: qty 1000

## 2022-06-24 MED ORDER — HYDROMORPHONE HCL 1 MG/ML IJ SOLN
1.0000 mg | Freq: Once | INTRAMUSCULAR | Status: AC
  Administered 2022-06-24: 1 mg via INTRAVENOUS
  Filled 2022-06-24: qty 1

## 2022-06-24 MED ORDER — SODIUM CHLORIDE 0.9 % IV BOLUS
1000.0000 mL | Freq: Once | INTRAVENOUS | Status: AC
Start: 2022-06-24 — End: 2022-06-24
  Administered 2022-06-24: 1000 mL via INTRAVENOUS

## 2022-06-24 MED ORDER — KETOROLAC TROMETHAMINE 30 MG/ML IJ SOLN
30.0000 mg | Freq: Once | INTRAMUSCULAR | Status: AC
  Administered 2022-06-24: 30 mg via INTRAVENOUS
  Filled 2022-06-24: qty 1

## 2022-06-24 MED ORDER — ONDANSETRON HCL 4 MG/2ML IJ SOLN
4.0000 mg | Freq: Once | INTRAMUSCULAR | Status: AC
  Administered 2022-06-24: 4 mg via INTRAVENOUS
  Filled 2022-06-24: qty 2

## 2022-06-24 MED ORDER — NAPROXEN 500 MG PO TABS: 500.0000 mg | ORAL_TABLET | Freq: Two times a day (BID) | ORAL | 0 refills | Status: DC

## 2022-06-24 MED ORDER — LORAZEPAM 2 MG/ML IJ SOLN
1.0000 mg | Freq: Once | INTRAMUSCULAR | Status: AC
  Administered 2022-06-24: 1 mg via INTRAVENOUS
  Filled 2022-06-24: qty 1

## 2022-06-24 MED ORDER — OXYCODONE HCL 5 MG PO TABS: 5.0000 mg | ORAL_TABLET | ORAL | 0 refills | Status: DC | PRN

## 2022-06-24 MED ORDER — METHOCARBAMOL 500 MG PO TABS: 500.0000 mg | ORAL_TABLET | Freq: Four times a day (QID) | ORAL | 0 refills | Status: DC | PRN

## 2022-06-24 NOTE — ED Provider Notes (Signed)
[facility] Emergency Department Provider Note MRN:  [id number]  Arrival date & time: 06/24/22     Chief Complaint   Back Pain   History of Present Illness   Lindsay Giles is a 46 y.o. year-old female with no pertinent past medical history presenting to the ED with chief complaint of back pain.  Patient explains that she threw her back out lifting something about 3 weeks ago.  Initially the pain was mild to moderate.  Became severely worse over the past week or so.  Radiation down the back of the left leg.  Has noticed some trouble fully voiding her bladder over the past few days, and today has been unable to urinate.  Also endorsing weakness to bilateral legs.  Saw neurosurgery in the outpatient setting, MRI was not approved by insurance.  Review of Systems  A thorough review of systems was obtained and all systems are negative except as noted in the HPI and PMH.   Patient's Health History    Past Medical History:  Diagnosis Date   Abnormal Pap smear    ADD (attention deficit disorder)    Anemia    Anxiety and depression    Complication of anesthesia    Dysrhythmia    Gastritis    Per patient   GERD (gastroesophageal reflux disease)    Menorrhagia    Pneumonia    PONV (postoperative nausea and vomiting)    Rapid heart rate    Tachycardia     Past Surgical History:  Procedure Laterality Date   BILATERAL SALPINGECTOMY Bilateral 10/13/2020   Procedure: BILATERAL SALPINGECTOMY;  Surgeon: Delsa Bern, MD;  Location: [place];  Service: Gynecology;  Laterality: Bilateral;  VAGINAL   BREAST REDUCTION SURGERY     CERVICAL ABLATION     pt denies having had d&c   DILATION AND CURETTAGE OF UTERUS     pt stateds did not have d & c , but rather a carvical ablation   ESOPHAGEAL DILATION  08/2020   EXAMINATION UNDER ANESTHESIA N/A 02/17/2014   Procedure: EXAM UNDER ANESTHESIA,  ULTRASOUND OF RECTUM, INCISION AND EXCISION OF  THROMBOSED CLOT ;  Surgeon: Shann Medal, MD;  Location: [facility];  Service: General;  Laterality: N/A;   KNEE SURGERY     right hand surgery     neuroma & groth on bone removed    TONSILLECTOMY AND ADENOIDECTOMY     VAGINAL HYSTERECTOMY N/A 10/13/2020   Procedure: HYSTERECTOMY VAGINAL;  Surgeon: Delsa Bern, MD;  Location: [place];  Service: Gynecology;  Laterality: N/A;    Family History  Problem Relation Age of Onset   Hypothyroidism Mother    Breast cancer Paternal Grandmother    Cancer Paternal Grandmother 3       breast   Arrhythmia Paternal Grandmother    Hyperlipidemia Paternal Grandmother    Angina Paternal Grandmother    Stroke Paternal Grandmother    Diabetes Son    Colon cancer Neg Hx    Colon polyps Neg Hx    Esophageal cancer Neg Hx    Stomach cancer Neg Hx    Rectal cancer Neg Hx    Inflammatory bowel disease Neg Hx    Liver disease Neg Hx    Pancreatic cancer Neg Hx     Social History   Socioeconomic History   Marital status: Married    Spouse name: Not on file   Number of children: Not on file   Years  of education: Not on file   Highest education level: Not on file  Occupational History   Not on file  Tobacco Use   Smoking status: Former    Types: Cigarettes   Smokeless tobacco: Never   Tobacco comments:    Social smoker  Vaping Use   Vaping Use: Never used  Substance and Sexual Activity   Alcohol use: Yes    Comment: Occasionally   Drug use: No   Sexual activity: Yes    Partners: Male    Birth control/protection: Surgical    Comment: vas   Other Topics Concern   Not on file  Social History Narrative   Daughter of Dr. 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[place] [employer] [place] Determinants of Health   Financial Resource Strain: Not on file  Food Insecurity: Not on file  Transportation Needs: Not on file  Physical Activity: Not on file  Stress: Not on file  Social Connections:  Not on file  Intimate Partner Violence: Not on file     Physical Exam   Vitals:   06/24/22 0022  BP: 127/81  Pulse: 92  Resp: 18  Temp: 98.4 F (36.9 C)  SpO2: 98%    CONSTITUTIONAL: Well-appearing, appears uncomfortable NEURO/PSYCH:  Alert and oriented x 3, limited exam due to pain but decreased motor abilities to the bilateral legs, sensation seems intact EYES:  eyes equal and reactive ENT/NECK:  no LAD, no JVD CARDIO: Regular rate, well-perfused, normal S1 and S2 PULM:  CTAB no wheezing or rhonchi GI/GU:  non-distended, non-tender MSK/SPINE:  No gross deformities, no edema SKIN:  no rash, atraumatic   *Additional and/or pertinent findings included in MDM below  Diagnostic and Interventional Summary    EKG Interpretation  Date/Time:    Ventricular Rate:    PR Interval:    QRS Duration:   QT Interval:    QTC Calculation:   R Axis:     Text Interpretation:         Labs Reviewed  CBC  BASIC METABOLIC PANEL  URINALYSIS, ROUTINE W REFLEX MICROSCOPIC    MR LUMBAR SPINE WO CONTRAST    (Results Pending)  MR THORACIC SPINE WO CONTRAST    (Results Pending)    Medications  sodium chloride 0.9 % bolus 1,000 mL (has no administration in time range)  HYDROmorphone (DILAUDID) injection 1 mg (has no administration in time range)  ondansetron (ZOFRAN) injection 4 mg (has no administration in time range)     Procedures  /  Critical Care .Critical Care  Performed by: Maudie Flakes, MD Authorized by: Maudie Flakes, MD   Critical care provider statement:    Critical care time (minutes):  35   Critical care was necessary to treat or prevent imminent or life-threatening deterioration of the following conditions: Concern for myelopathy or cauda equina syndrome.   Critical care was time spent personally by me on the following activities:  Development of treatment plan with patient or surrogate, discussions with consultants, evaluation of patient's response to treatment,  examination of patient, ordering and review of laboratory studies, ordering and review of radiographic studies, ordering and performing treatments and interventions, pulse oximetry, re-evaluation of patient's condition and review of old charts   ED Course and Medical Decision Making  Initial Impression and Ddx Concern for cauda equina or myelopathy given the significant worsening in back pain with the leg weakness, urinary retention.  Her suprapubic region is distended, will place Foley catheter.  She needs emergent MRI imaging.  Dr. Christy Gentles is the emergency provider at [facility] who accepts patient in transfer.  Past medical/surgical history that increases complexity of ED encounter: None  Interpretation of Diagnostics Laboratory evaluation is pending  Patient Reassessment and Ultimate Disposition/Management     Transfer to [facility]  Patient management required discussion with the following services or consulting groups:  None  Complexity of Problems Addressed Acute illness or injury that poses threat of life of bodily function  Additional Data Reviewed and Analyzed Further history obtained from: None  Additional Factors Impacting ED Encounter Risk Use of parenteral controlled substances and Consideration of hospitalization  Barth Kirks. Sedonia Small, MD [place] [email or web address]  Final Clinical Impressions(s) / ED Diagnoses     ICD-10-CM   1. Acute midline low back pain with left-sided sciatica  M54.42     2. Urinary retention  R33.9       ED Discharge Orders     None        Discharge Instructions Discussed with and Provided to Patient:   Discharge Instructions   None      Maudie Flakes, MD 06/24/22 [phone]

## 2022-06-24 NOTE — Discharge Instructions (Addendum)
Apply ice for 30 minutes at a time, 4 times a day.  If you need additional pain relief beyond naproxen, you may take acetaminophen.  When you combine acetaminophen with naproxen, you get better pain relief and you get from either medication by itself.  If you still need additional pain relief, you may add oxycodone.  You may also add a lidocaine patch like Salonpas as needed.  Take the muscle relaxer (methocarbamol) and as needed.  Return if you are having any problems.

## 2022-06-24 NOTE — ED Notes (Signed)
Patient transported to MRI 

## 2022-06-24 NOTE — ED Notes (Addendum)
Pt arrives from [facility] ED as transfer for MRI. Pt is aox4, has 1L NS infusing in 20g RAC. Pt reports mid back pain w/ numbness in bilateral hands and feet. Foley in place.

## 2022-06-24 NOTE — ED Provider Notes (Signed)
Patient transferred from [facility] with back pain and new onset urinary retention and leg weakness concerning for cauda equina syndrome.  MRI of thoracic and lumbar spine has been ordered.  Patient is complaining of ongoing pain, I have ordered additional hydromorphone for pain.  I have reviewed and interpreted the laboratory tests obtained prior to her being transferred, my interpretation is mild leukocytosis otherwise normal CBC, minimally elevated random glucose and otherwise normal basic metabolic panel.  MRI scan shows several mild disc protrusions without any spinal cord changes or nerve impingements.  No evidence of cauda equina syndrome.  Patient was reevaluated after MRI scan and analgesics.  She has significant tenderness of the mid and upper lumbar spine and moderate to severe bilateral paralumbar spasm.  Strength is 5/5 in all muscles of both legs.  Sensation is normal.  Patient does admit that she has done a lot of lifting and bending in preparing to send her child off to college.  At this point, I do feel that her pain is entirely musculoskeletal, no evidence of myelopathy or structural disease.  I have ordered a dose of intravenous ketorolac and intravenous methocarbamol and will reassess the patient following these.  She got good relief of pain with above-noted treatment.  I am discharging her with prescriptions for naproxen, methocarbamol, oxycodone.  Advised to also use ice and topical lidocaine patches as needed.  Foley catheter is discontinued prior to discharge, I believe she had urinary retention slowly secondary to pain as there is no evidence of any neurologic injury.  Return precautions are discussed.  Results for orders placed or performed during the hospital encounter of 06/24/22  CBC  Result Value Ref Range   WBC 11.0 (H) 4.0 - 10.5 K/uL   RBC 4.10 3.87 - 5.11 MIL/uL   Hemoglobin 12.9 12.0 - 15.0 g/dL   HCT 36.8 36.0 - 46.0 %   MCV 89.8 80.0 - 100.0 fL   MCH 31.5  26.0 - 34.0 pg   MCHC 35.1 30.0 - 36.0 g/dL   RDW 12.6 11.5 - 15.5 %   Platelets 256 150 - 400 K/uL   nRBC 0.0 0.0 - 0.2 %  Basic metabolic panel  Result Value Ref Range   Sodium 135 135 - 145 mmol/L   Potassium 3.5 3.5 - 5.1 mmol/L   Chloride 102 98 - 111 mmol/L   CO2 26 22 - 32 mmol/L   Glucose, Bld 108 (H) 70 - 99 mg/dL   BUN 13 6 - 20 mg/dL   Creatinine, Ser 0.50 0.44 - 1.00 mg/dL   Calcium 9.4 8.9 - 10.3 mg/dL   GFR, Estimated >60 >60 mL/min   Anion gap 7 5 - 15  Urinalysis, Routine w reflex microscopic Urine, Clean Catch  Result Value Ref Range   Color, Urine YELLOW YELLOW   APPearance CLEAR CLEAR   Specific Gravity, Urine 1.009 1.005 - 1.030   pH 5.5 5.0 - 8.0   Glucose, UA NEGATIVE NEGATIVE mg/dL   Hgb urine dipstick NEGATIVE NEGATIVE   Bilirubin Urine NEGATIVE NEGATIVE   Ketones, ur NEGATIVE NEGATIVE mg/dL   Protein, ur NEGATIVE NEGATIVE mg/dL   Nitrite NEGATIVE NEGATIVE   Leukocytes,Ua NEGATIVE NEGATIVE   MR LUMBAR SPINE WO CONTRAST  Result Date: 06/24/2022 CLINICAL DATA:  Initial evaluation for acute myelopathy. Low back pain. EXAM: MRI THORACIC AND LUMBAR SPINE WITHOUT CONTRAST TECHNIQUE: Multiplanar and multiecho pulse sequences of the thoracic and lumbar spine were obtained without intravenous contrast. COMPARISON:  Prior study from  04/10/2014. FINDINGS: MRI THORACIC SPINE FINDINGS Alignment: Vertebral bodies normally aligned with preservation of the normal thoracic kyphosis. No listhesis. Vertebrae: Vertebral body height maintained without acute or chronic fracture. Bone marrow signal intensity mildly heterogeneous but overall within normal limits. No discrete or worrisome osseous lesions. No abnormal marrow edema. Cord:  Normal signal and morphology. Paraspinal and other soft tissues: Unremarkable. Disc levels: T1-2:  Unremarkable. T2-3: Unremarkable. T3-4:  Unremarkable. T4-5:  Unremarkable. T5-6: Small central disc protrusion with annular fissure indents the ventral  thecal sac. No significant spinal stenosis or cord deformity. Foramina remain patent. T6-7: Tiny central disc protrusion minimally indents the ventral thecal sac. Associated annular fissure. Mild right-sided facet hypertrophy. No significant spinal stenosis. Foramina remain patent. T7-8: Small central disc protrusion with annular fissure minimally indents the ventral thecal sac. No significant spinal stenosis or cord deformity. Foramina remain patent. T8-9: Disc desiccation with mild disc bulge and reactive endplate spurring. Superimposed central disc extrusion indents the ventral thecal sac, contacting and flattening the ventral cord (series 22, image 29). No cord signal changes or significant spinal stenosis. This is new from prior. T9-10: Negative interspace. Mild facet hypertrophy. No canal or foraminal stenosis. T10-11: Small chronic endplate Schmorl's node deformity without significant disc bulge. Minimal left-sided facet hypertrophy. No canal or foraminal stenosis. T11-12: Disc desiccation with mild diffuse disc bulge. Associated reactive endplate spurring. Flattening of the ventral thecal sac without significant spinal stenosis or cord deformity. Foramina remain patent. T12-L1: Seen only on sagittal projection. Probable small left paracentral disc protrusion. No significant spinal stenosis. Foramina remain patent. MRI LUMBAR SPINE FINDINGS Segmentation: Standard. Lowest well-formed disc space labeled the L5-S1 level. Alignment: Physiologic with preservation of the normal lumbar lordosis. No listhesis. Vertebrae: Vertebral body height maintained without acute or chronic fracture. Bone marrow signal intensity mildly heterogeneous but overall within normal limits. No discrete or worrisome osseous lesions. No abnormal marrow edema. Conus medullaris and cauda equina: Conus extends to the L1 level. Conus and cauda equina appear normal. Paraspinal and other soft tissues: Unremarkable. Disc levels: L1-2: Normal  interspace. Mild facet spurring. No canal or foraminal stenosis. L2-3: Normal interspace. Mild facet spurring. No canal or foraminal stenosis. L3-4: Disc desiccation with minimal annular disc bulge. Mild facet hypertrophy. No significant spinal stenosis. Mild right L3 foraminal narrowing. Left neural foramen remains patent. L4-5: Mild disc bulge. Mild bilateral facet hypertrophy. No significant spinal stenosis. Foramina remain patent. L5-S1: Normal interspace. Minimal facet hypertrophy. No canal or foraminal stenosis. IMPRESSION: MR THORACIC SPINE IMPRESSION: 1. Normal MRI appearance of the thoracic spinal cord. No evidence for myelopathy. 2. Small central disc extrusion at T8-9 with secondary mild flattening of the ventral spinal cord. No cord signal changes or significant spinal stenosis. This is new/progressed as compared to 04/10/2014. 3. Additional multilevel degenerative spondylosis including a few small disc protrusions at T5-6 through T12-L1 as detailed above. No other significant canal or foraminal stenosis or evidence for neural impingement. MR LUMBAR SPINE IMPRESSION: 1. Normal MRI appearance of the conus medullaris and cauda equina. No evidence for cord compression or high-grade stenosis. 2. Mild for age degenerative spondylosis and facet arthrosis as detailed above. No significant stenosis or evidence for neural impingement. Electronically Signed   By: Jeannine Boga M.D.   On: 06/24/2022 04:55   MR THORACIC SPINE WO CONTRAST  Result Date: 06/24/2022 CLINICAL DATA:  Initial evaluation for acute myelopathy. Low back pain. EXAM: MRI THORACIC AND LUMBAR SPINE WITHOUT CONTRAST TECHNIQUE: Multiplanar and multiecho pulse sequences of the thoracic  and lumbar spine were obtained without intravenous contrast. COMPARISON:  Prior study from 04/10/2014. FINDINGS: MRI THORACIC SPINE FINDINGS Alignment: Vertebral bodies normally aligned with preservation of the normal thoracic kyphosis. No listhesis.  Vertebrae: Vertebral body height maintained without acute or chronic fracture. Bone marrow signal intensity mildly heterogeneous but overall within normal limits. No discrete or worrisome osseous lesions. No abnormal marrow edema. Cord:  Normal signal and morphology. Paraspinal and other soft tissues: Unremarkable. Disc levels: T1-2:  Unremarkable. T2-3: Unremarkable. T3-4:  Unremarkable. T4-5:  Unremarkable. T5-6: Small central disc protrusion with annular fissure indents the ventral thecal sac. No significant spinal stenosis or cord deformity. Foramina remain patent. T6-7: Tiny central disc protrusion minimally indents the ventral thecal sac. Associated annular fissure. Mild right-sided facet hypertrophy. No significant spinal stenosis. Foramina remain patent. T7-8: Small central disc protrusion with annular fissure minimally indents the ventral thecal sac. No significant spinal stenosis or cord deformity. Foramina remain patent. T8-9: Disc desiccation with mild disc bulge and reactive endplate spurring. Superimposed central disc extrusion indents the ventral thecal sac, contacting and flattening the ventral cord (series 22, image 29). No cord signal changes or significant spinal stenosis. This is new from prior. T9-10: Negative interspace. Mild facet hypertrophy. No canal or foraminal stenosis. T10-11: Small chronic endplate Schmorl's node deformity without significant disc bulge. Minimal left-sided facet hypertrophy. No canal or foraminal stenosis. T11-12: Disc desiccation with mild diffuse disc bulge. Associated reactive endplate spurring. Flattening of the ventral thecal sac without significant spinal stenosis or cord deformity. Foramina remain patent. T12-L1: Seen only on sagittal projection. Probable small left paracentral disc protrusion. No significant spinal stenosis. Foramina remain patent. MRI LUMBAR SPINE FINDINGS Segmentation: Standard. Lowest well-formed disc space labeled the L5-S1 level. Alignment:  Physiologic with preservation of the normal lumbar lordosis. No listhesis. Vertebrae: Vertebral body height maintained without acute or chronic fracture. Bone marrow signal intensity mildly heterogeneous but overall within normal limits. No discrete or worrisome osseous lesions. No abnormal marrow edema. Conus medullaris and cauda equina: Conus extends to the L1 level. Conus and cauda equina appear normal. Paraspinal and other soft tissues: Unremarkable. Disc levels: L1-2: Normal interspace. Mild facet spurring. No canal or foraminal stenosis. L2-3: Normal interspace. Mild facet spurring. No canal or foraminal stenosis. L3-4: Disc desiccation with minimal annular disc bulge. Mild facet hypertrophy. No significant spinal stenosis. Mild right L3 foraminal narrowing. Left neural foramen remains patent. L4-5: Mild disc bulge. Mild bilateral facet hypertrophy. No significant spinal stenosis. Foramina remain patent. L5-S1: Normal interspace. Minimal facet hypertrophy. No canal or foraminal stenosis. IMPRESSION: MR THORACIC SPINE IMPRESSION: 1. Normal MRI appearance of the thoracic spinal cord. No evidence for myelopathy. 2. Small central disc extrusion at T8-9 with secondary mild flattening of the ventral spinal cord. No cord signal changes or significant spinal stenosis. This is new/progressed as compared to 04/10/2014. 3. Additional multilevel degenerative spondylosis including a few small disc protrusions at T5-6 through T12-L1 as detailed above. No other significant canal or foraminal stenosis or evidence for neural impingement. MR LUMBAR SPINE IMPRESSION: 1. Normal MRI appearance of the conus medullaris and cauda equina. No evidence for cord compression or high-grade stenosis. 2. Mild for age degenerative spondylosis and facet arthrosis as detailed above. No significant stenosis or evidence for neural impingement. Electronically Signed   By: Jeannine Boga M.D.   On: 09/62/8366 29:47       Delora Fuel,  MD 65/46/50 [phone]

## 2022-06-24 NOTE — ED Triage Notes (Signed)
Complaining of back pain tonight, has not been able to void  since earlier today, now the pain in the lower back and pelvic region is unbearable  A&O x4, grimacing with pain in the back, unable to sit.

## 2022-07-02 DIAGNOSIS — M79641 Pain in right hand: Secondary | ICD-10-CM | POA: Diagnosis not present

## 2022-07-02 DIAGNOSIS — S63650A Sprain of metacarpophalangeal joint of right index finger, initial encounter: Secondary | ICD-10-CM | POA: Diagnosis not present

## 2022-07-06 DIAGNOSIS — M5416 Radiculopathy, lumbar region: Secondary | ICD-10-CM | POA: Diagnosis not present

## 2022-07-22 DIAGNOSIS — M5412 Radiculopathy, cervical region: Secondary | ICD-10-CM | POA: Diagnosis not present

## 2022-07-28 DIAGNOSIS — F909 Attention-deficit hyperactivity disorder, unspecified type: Secondary | ICD-10-CM | POA: Diagnosis not present

## 2022-07-28 DIAGNOSIS — F411 Generalized anxiety disorder: Secondary | ICD-10-CM | POA: Diagnosis not present

## 2022-07-28 DIAGNOSIS — F332 Major depressive disorder, recurrent severe without psychotic features: Secondary | ICD-10-CM | POA: Diagnosis not present

## 2022-08-09 DIAGNOSIS — Z79899 Other long term (current) drug therapy: Secondary | ICD-10-CM | POA: Diagnosis not present

## 2022-08-09 DIAGNOSIS — R634 Abnormal weight loss: Secondary | ICD-10-CM | POA: Insufficient documentation

## 2022-08-09 DIAGNOSIS — Z5181 Encounter for therapeutic drug level monitoring: Secondary | ICD-10-CM | POA: Diagnosis not present

## 2022-08-09 DIAGNOSIS — M542 Cervicalgia: Secondary | ICD-10-CM | POA: Diagnosis not present

## 2022-08-09 DIAGNOSIS — K589 Irritable bowel syndrome without diarrhea: Secondary | ICD-10-CM | POA: Diagnosis not present

## 2022-08-09 DIAGNOSIS — M5416 Radiculopathy, lumbar region: Secondary | ICD-10-CM | POA: Diagnosis not present

## 2022-08-13 ENCOUNTER — Telehealth: Payer: Self-pay

## 2022-08-13 NOTE — Telephone Encounter (Signed)
-----   Message from Irving Copas., MD sent at 08/13/2022  2:05 PM EDT ----- Regarding: Schedule a clinic visit [facility] or [facility], Please reach out to this patient and schedule her for a clinic visit on Friday morning next week. OK to overbook. Thanks. GM

## 2022-08-13 NOTE — Telephone Encounter (Signed)
The pt has been advised of the appt for 08/20/22 at 930 am with GM.

## 2022-08-13 NOTE — Telephone Encounter (Signed)
Appt made for 08/20/22 at 930 am.

## 2022-08-19 DIAGNOSIS — M62830 Muscle spasm of back: Secondary | ICD-10-CM | POA: Diagnosis not present

## 2022-08-19 DIAGNOSIS — M5451 Vertebrogenic low back pain: Secondary | ICD-10-CM | POA: Diagnosis not present

## 2022-08-19 DIAGNOSIS — M5136 Other intervertebral disc degeneration, lumbar region: Secondary | ICD-10-CM | POA: Diagnosis not present

## 2022-08-20 ENCOUNTER — Ambulatory Visit: Payer: BC Managed Care – PPO | Admitting: Gastroenterology

## 2022-08-23 ENCOUNTER — Other Ambulatory Visit: Payer: Self-pay | Admitting: Orthopedic Surgery

## 2022-08-23 DIAGNOSIS — M5136 Other intervertebral disc degeneration, lumbar region: Secondary | ICD-10-CM

## 2022-09-08 DIAGNOSIS — E559 Vitamin D deficiency, unspecified: Secondary | ICD-10-CM | POA: Diagnosis not present

## 2022-09-08 DIAGNOSIS — R634 Abnormal weight loss: Secondary | ICD-10-CM | POA: Diagnosis not present

## 2022-09-08 DIAGNOSIS — R232 Flushing: Secondary | ICD-10-CM | POA: Diagnosis not present

## 2022-09-08 DIAGNOSIS — R768 Other specified abnormal immunological findings in serum: Secondary | ICD-10-CM | POA: Diagnosis not present

## 2022-09-09 ENCOUNTER — Ambulatory Visit
Admission: RE | Admit: 2022-09-09 | Discharge: 2022-09-09 | Disposition: A | Discharge status: Home / Self Care | Payer: BC Managed Care – PPO | Source: Ambulatory Visit | Attending: Orthopedic Surgery | Admitting: Orthopedic Surgery

## 2022-09-09 ENCOUNTER — Other Ambulatory Visit: Payer: Self-pay | Admitting: Orthopedic Surgery

## 2022-09-09 DIAGNOSIS — M5136 Other intervertebral disc degeneration, lumbar region: Secondary | ICD-10-CM

## 2022-09-09 MED ORDER — MEPERIDINE HCL 50 MG/ML IJ SOLN: 50.0000 mg | Freq: Once | INTRAMUSCULAR | Status: DC | PRN

## 2022-09-09 MED ORDER — ONDANSETRON HCL 4 MG/2ML IJ SOLN: 4.0000 mg | Freq: Once | INTRAMUSCULAR | Status: DC | PRN

## 2022-09-09 MED ORDER — DIAZEPAM 5 MG PO TABS: 10.0000 mg | ORAL_TABLET | Freq: Once | ORAL | Status: DC

## 2022-09-09 NOTE — Discharge Instructions (Signed)

## 2022-09-14 ENCOUNTER — Other Ambulatory Visit: Payer: BC Managed Care – PPO

## 2022-09-14 ENCOUNTER — Inpatient Hospital Stay
Admission: RE | Admit: 2022-09-14 | Discharge: 2022-09-14 | Disposition: A | Discharge status: Home / Self Care | Payer: BC Managed Care – PPO | Source: Ambulatory Visit | Attending: Orthopedic Surgery | Admitting: Orthopedic Surgery

## 2022-09-14 NOTE — Discharge Instructions (Signed)

## 2022-09-15 ENCOUNTER — Ambulatory Visit: Payer: BC Managed Care – PPO | Admitting: Physician Assistant

## 2022-09-15 DIAGNOSIS — F909 Attention-deficit hyperactivity disorder, unspecified type: Secondary | ICD-10-CM | POA: Diagnosis not present

## 2022-09-15 DIAGNOSIS — Z79899 Other long term (current) drug therapy: Secondary | ICD-10-CM | POA: Diagnosis not present

## 2022-09-15 DIAGNOSIS — Z5181 Encounter for therapeutic drug level monitoring: Secondary | ICD-10-CM | POA: Diagnosis not present

## 2022-09-15 DIAGNOSIS — F332 Major depressive disorder, recurrent severe without psychotic features: Secondary | ICD-10-CM | POA: Diagnosis not present

## 2022-09-15 DIAGNOSIS — M545 Low back pain, unspecified: Secondary | ICD-10-CM | POA: Diagnosis not present

## 2022-09-15 DIAGNOSIS — F419 Anxiety disorder, unspecified: Secondary | ICD-10-CM | POA: Diagnosis not present

## 2022-09-20 DIAGNOSIS — Z113 Encounter for screening for infections with a predominantly sexual mode of transmission: Secondary | ICD-10-CM | POA: Diagnosis not present

## 2022-09-20 DIAGNOSIS — F1120 Opioid dependence, uncomplicated: Secondary | ICD-10-CM | POA: Diagnosis not present

## 2022-09-22 DIAGNOSIS — F419 Anxiety disorder, unspecified: Secondary | ICD-10-CM | POA: Diagnosis not present

## 2022-09-22 DIAGNOSIS — M545 Low back pain, unspecified: Secondary | ICD-10-CM | POA: Diagnosis not present

## 2022-09-22 DIAGNOSIS — F909 Attention-deficit hyperactivity disorder, unspecified type: Secondary | ICD-10-CM | POA: Diagnosis not present

## 2022-09-22 DIAGNOSIS — Z5181 Encounter for therapeutic drug level monitoring: Secondary | ICD-10-CM | POA: Diagnosis not present

## 2022-09-22 DIAGNOSIS — F332 Major depressive disorder, recurrent severe without psychotic features: Secondary | ICD-10-CM | POA: Diagnosis not present

## 2022-09-24 DIAGNOSIS — M545 Low back pain, unspecified: Secondary | ICD-10-CM | POA: Diagnosis not present

## 2022-09-24 DIAGNOSIS — K219 Gastro-esophageal reflux disease without esophagitis: Secondary | ICD-10-CM | POA: Diagnosis not present

## 2022-09-24 DIAGNOSIS — L719 Rosacea, unspecified: Secondary | ICD-10-CM | POA: Diagnosis not present

## 2022-09-29 DIAGNOSIS — F909 Attention-deficit hyperactivity disorder, unspecified type: Secondary | ICD-10-CM | POA: Diagnosis not present

## 2022-09-29 DIAGNOSIS — F332 Major depressive disorder, recurrent severe without psychotic features: Secondary | ICD-10-CM | POA: Diagnosis not present

## 2022-09-29 DIAGNOSIS — F419 Anxiety disorder, unspecified: Secondary | ICD-10-CM | POA: Diagnosis not present

## 2022-09-29 DIAGNOSIS — M545 Low back pain, unspecified: Secondary | ICD-10-CM | POA: Diagnosis not present

## 2022-10-04 DIAGNOSIS — N390 Urinary tract infection, site not specified: Secondary | ICD-10-CM | POA: Diagnosis not present

## 2022-10-06 DIAGNOSIS — F332 Major depressive disorder, recurrent severe without psychotic features: Secondary | ICD-10-CM | POA: Diagnosis not present

## 2022-10-06 DIAGNOSIS — F419 Anxiety disorder, unspecified: Secondary | ICD-10-CM | POA: Diagnosis not present

## 2022-10-06 DIAGNOSIS — M545 Low back pain, unspecified: Secondary | ICD-10-CM | POA: Diagnosis not present

## 2022-10-06 DIAGNOSIS — F909 Attention-deficit hyperactivity disorder, unspecified type: Secondary | ICD-10-CM | POA: Diagnosis not present

## 2022-10-12 DIAGNOSIS — B9689 Other specified bacterial agents as the cause of diseases classified elsewhere: Secondary | ICD-10-CM | POA: Diagnosis not present

## 2022-10-12 DIAGNOSIS — H9209 Otalgia, unspecified ear: Secondary | ICD-10-CM | POA: Diagnosis not present

## 2022-10-12 DIAGNOSIS — J019 Acute sinusitis, unspecified: Secondary | ICD-10-CM | POA: Diagnosis not present

## 2022-10-12 DIAGNOSIS — R059 Cough, unspecified: Secondary | ICD-10-CM | POA: Diagnosis not present

## 2022-10-14 DIAGNOSIS — F331 Major depressive disorder, recurrent, moderate: Secondary | ICD-10-CM | POA: Diagnosis not present

## 2022-10-15 DIAGNOSIS — F331 Major depressive disorder, recurrent, moderate: Secondary | ICD-10-CM | POA: Diagnosis not present

## 2022-10-19 DIAGNOSIS — F331 Major depressive disorder, recurrent, moderate: Secondary | ICD-10-CM | POA: Diagnosis not present

## 2022-10-20 ENCOUNTER — Other Ambulatory Visit: Payer: BC Managed Care – PPO

## 2022-10-20 ENCOUNTER — Encounter: Payer: Self-pay | Admitting: Physician Assistant

## 2022-10-20 ENCOUNTER — Ambulatory Visit (INDEPENDENT_AMBULATORY_CARE_PROVIDER_SITE_OTHER): Payer: BC Managed Care – PPO | Admitting: Physician Assistant

## 2022-10-20 ENCOUNTER — Other Ambulatory Visit (INDEPENDENT_AMBULATORY_CARE_PROVIDER_SITE_OTHER): Payer: BC Managed Care – PPO

## 2022-10-20 VITALS — BP 100/70 | HR 113 | Ht 66.0 in | Wt 120.2 lb

## 2022-10-20 DIAGNOSIS — R634 Abnormal weight loss: Secondary | ICD-10-CM

## 2022-10-20 DIAGNOSIS — R1010 Upper abdominal pain, unspecified: Secondary | ICD-10-CM

## 2022-10-20 DIAGNOSIS — R899 Unspecified abnormal finding in specimens from other organs, systems and tissues: Secondary | ICD-10-CM | POA: Diagnosis not present

## 2022-10-20 DIAGNOSIS — K219 Gastro-esophageal reflux disease without esophagitis: Secondary | ICD-10-CM

## 2022-10-20 DIAGNOSIS — R059 Cough, unspecified: Secondary | ICD-10-CM | POA: Diagnosis not present

## 2022-10-20 DIAGNOSIS — Z87898 Personal history of other specified conditions: Secondary | ICD-10-CM

## 2022-10-20 LAB — COMPREHENSIVE METABOLIC PANEL
ALT: 20 U/L (ref 0–35)
AST: 17 U/L (ref 0–37)
Albumin: 4.4 g/dL (ref 3.5–5.2)
Alkaline Phosphatase: 49 U/L (ref 39–117)
BUN: 8 mg/dL (ref 6–23)
CO2: 29 mEq/L (ref 19–32)
Calcium: 9.2 mg/dL (ref 8.4–10.5)
Chloride: 104 mEq/L (ref 96–112)
Creatinine, Ser: 0.61 mg/dL (ref 0.40–1.20)
GFR: 108.32 mL/min (ref >60.00)
Glucose, Bld: 96 mg/dL (ref 70–99)
Potassium: 4.1 mEq/L (ref 3.5–5.1)
Sodium: 138 mEq/L (ref 135–145)
Total Bilirubin: 0.3 mg/dL (ref 0.2–1.2)
Total Protein: 7.3 g/dL (ref 6.0–8.3)

## 2022-10-20 LAB — CBC WITH DIFFERENTIAL/PLATELET
Basophils Absolute: 0 10*3/uL (ref 0.0–0.1)
Basophils Relative: 0.2 % (ref 0.0–3.0)
Eosinophils Absolute: 0.1 10*3/uL (ref 0.0–0.7)
Eosinophils Relative: 1.3 % (ref 0.0–5.0)
HCT: 41.2 % (ref 36.0–46.0)
Hemoglobin: 13.7 g/dL (ref 12.0–15.0)
Lymphocytes Relative: 15.4 % (ref 12.0–46.0)
Lymphs Abs: 1.6 10*3/uL (ref 0.7–4.0)
MCHC: 33.3 g/dL (ref 30.0–36.0)
MCV: 91.7 fl (ref 78.0–100.0)
Monocytes Absolute: 0.5 10*3/uL (ref 0.1–1.0)
Monocytes Relative: 5.2 % (ref 3.0–12.0)
Neutro Abs: 7.9 10*3/uL — ABNORMAL HIGH (ref 1.4–7.7)
Neutrophils Relative %: 77.9 % — ABNORMAL HIGH (ref 43.0–77.0)
Platelets: 320 10*3/uL (ref 150.0–400.0)
RBC: 4.5 Mil/uL (ref 3.87–5.11)
RDW: 12.2 % (ref 11.5–15.5)
WBC: 10.1 10*3/uL (ref 4.0–10.5)

## 2022-10-20 MED ORDER — PANTOPRAZOLE SODIUM 40 MG PO TBEC: 40.0000 mg | DELAYED_RELEASE_TABLET | Freq: Two times a day (BID) | ORAL | 11 refills | Status: DC

## 2022-10-20 NOTE — Patient Instructions (Signed)
_______________________________________________________  If you are age 44 or older, your body mass index should be between 23-30. Your Body mass index is 19.4 kg/m. If this is out of the aforementioned range listed, please consider follow up with your Primary Care Provider.  If you are age 49 or younger, your body mass index should be between 19-25. Your Body mass index is 19.4 kg/m. If this is out of the aformentioned range listed, please consider follow up with your Primary Care Provider.   ________________________________________________________  The [facility] providers would like to encourage you to use [facility] to communicate with providers for non-urgent requests or questions.  Due to long hold times on the telephone, sending your provider a message by [facility] may be a faster and more efficient way to get a response.  Please allow 48 business hours for a response.  Please remember that this is for non-urgent requests.  _______________________________________________________  Your provider has requested that you go to the basement level for lab work before leaving today. Press "B" on the elevator. The lab is located at the first door on the left as you exit the elevator.  We have sent the following medications to your pharmacy for you to pick up at your convenience: Protonix 65m 2 times a day  [facility] [id number]   You are scheduled on 10-21-2022 at 1130am. You should arrive 2 hours prior to your appointment time for registration. Please arrive by 915am  Please follow the written instructions below on the day of your exam:  WARNING: IF YOU ARE ALLERGIC TO IODINE/X-RAY DYE, PLEASE NOTIFY RADIOLOGY IMMEDIATELY AT [phone] YOU WILL BE GIVEN A 13 HOUR PREMEDICATION PREP.  1) Do not eat or drink anything after 730am (4 hours prior to your test) 2) You have been given 2  bottles of oral contrast to drink. The solution may taste better if refrigerated, but do NOT add ice or any other liquid to this solution. Shake well before drinking.    Drink 1 bottle of contrast @ 930am (2 hours prior to your exam)  Drink 1 bottle of contrast @ 1030am (1 hour prior to your exam)  You may take any medications as prescribed with a small amount of water, if necessary. If you take any of the following medications: METFORMIN, GLUCOPHAGE, GLUCOVANCE, AVANDAMET, RIOMET, FORTAMET, ABuelltonMET, JANUMET, GLUMETZA or METAGLIP, you MAY be asked to HOLD this medication 48 hours AFTER the exam.  The purpose of you drinking the oral contrast is to aid in the visualization of your intestinal tract. The contrast solution may cause some diarrhea. Depending on your individual set of symptoms, you may also receive an intravenous injection of x-ray contrast/dye. [facility] depending on the type of exam you are having performed.  This test typically takes 30-45 minutes to complete.  If you have any questions regarding your exam or if you need to reschedule, you may call the CT department at [phone] the hours of 8:00 am and 5:00 pm, Monday-Friday.  ________________________________________________________________________  It was a pleasure to see you today!  Thank you for trusting me with your gastrointestinal care!

## 2022-10-20 NOTE — Progress Notes (Signed)
Subjective:    Patient ID: Lindsay Giles, female    DOB: 11-24-1975, 46 y.o.   MRN: [id number]  HPI Lindsay Giles is a 46 year old white female, established with Dr. Stefani Dama Giles, who was last seen in March 2023.  She has history of chronic GERD, ADHD, depression, fibromyalgia.  When she was last seen she was complaining of some lower abdominal pain, nausea and intermittent generalized abdominal pain.  She had stool studies done with normal fecal elastase, and fecal fat, amylase and lipase were unremarkable and TSH was normal. She had had a CT scan of the abdomen and pelvis done in January 2023 which did show a large liver measuring 19.4 cm, normal-appearing spleen normal-appearing gallbladder, there was a cystic lesion in the midline pelvis measuring 3.5 x 3.3 cm, concerning for a right ovarian cyst versus seroma enlarged since the prior study.  Patient relates that she still was seen by her gynecologist after that and had resolution of the ovarian cyst. She had had prior EGD and colonoscopy done in 2021.  Colonoscopy was normal with the exception of internal hemorrhoids and biopsies were negative for microscopic colitis.  At EGD she had a 2 cm hiatal hernia and had empiric esophageal dilation to 18 mm. Today she is asking about results of the stool studies.  She had been concerned about weight loss but says that she has gained a few pounds over the past few weeks.  She says that her appetite is okay and she is able to eat at this point denies any nausea or vomiting.  She says occasionally her abdomen will feel distended.  She just finished a course of Augmentin for an upper respiratory infection which she says gave her diarrhea.  Her usual state is to be more on the constipated side. She has been on chronic twice daily Protonix for reflux symptoms no real dysphagia or dyne aphasia currently.  She says she has been having some upper abdominal discomfort recently.  No nausea or vomiting.  She denies any  regular NSAID use, says she has not had any alcohol over the past month but prior to that time had been drinking some alcohol on a fairly regular basis.  No recent labs.  (See notes from primary care 09/08/2022)   Review of Systems Pertinent positive and negative review of systems were noted in the above HPI section.  All other review of systems was otherwise negative.   Outpatient Encounter Medications as of 10/20/2022  Medication Sig   atomoxetine (STRATTERA) 25 MG capsule Take 25 mg by mouth 2 (two) times daily.   celecoxib (CELEBREX) 200 MG capsule Take 200 mg by mouth as needed.   clonazePAM (KLONOPIN) 0.5 MG tablet Take 0.5 mg by mouth 2 (two) times daily as needed for anxiety.   Cyanocobalamin (VITAMIN B-12) 5000 MCG TBDP Take 5,000 mcg by mouth daily.   desvenlafaxine (PRISTIQ) 100 MG 24 hr tablet Take 100 mg by mouth at bedtime.    Docusate Calcium (STOOL SOFTENER PO) Take 1 capsule by mouth daily. *takes "stool softener" daily, takes additional docusate as needed   doxycycline (VIBRAMYCIN) 100 MG capsule Take 100 mg by mouth 2 (two) times daily.   hydrOXYzine (ATARAX) 10 MG tablet Take 10 mg by mouth 3 (three) times daily as needed.   metaxalone (SKELAXIN) 800 MG tablet Take 1 tablet by mouth 3 (three) times daily as needed.   Omega-3 Fatty Acids (FISH OIL PO) Take 1 capsule by mouth daily.   ondansetron (ZOFRAN)  8 MG tablet Take 1 tablet (8 mg total) by mouth daily as needed for nausea.   traZODone (DESYREL) 100 MG tablet Take 200 mg by mouth at bedtime.    VITAMIN D PO Take 3 capsules by mouth daily.   [DISCONTINUED] pantoprazole (PROTONIX) 40 MG tablet Take 1 tablet (40 mg total) by mouth 2 (two) times daily before a meal.   lisdexamfetamine (VYVANSE) 40 MG capsule Take 1 capsule (40 mg total) by mouth every morning. (Patient not taking: Reported on 10/20/2022)   methylcellulose (CITRUCEL) oral powder Take 1 packet by mouth daily. (Patient not taking: Reported on 10/20/2022)    naproxen (NAPROSYN) 500 MG tablet Take 1 tablet (500 mg total) by mouth 2 (two) times daily. (Patient not taking: Reported on 10/20/2022)   oxyCODONE (ROXICODONE) 5 MG immediate release tablet Take 1 tablet (5 mg total) by mouth every 4 (four) hours as needed for severe pain. (Patient not taking: Reported on 10/20/2022)   pantoprazole (PROTONIX) 40 MG tablet Take 1 tablet (40 mg total) by mouth 2 (two) times daily before a meal.   [DISCONTINUED] methocarbamol (ROBAXIN) 500 MG tablet Take 1 tablet (500 mg total) by mouth every 6 (six) hours as needed for muscle spasms. (Patient not taking: Reported on 10/20/2022)   No facility-administered encounter medications on file as of 10/20/2022.   Allergies  Allergen Reactions   Atrovent [Ipratropium] Other (See Comments)    Uncontrollable nosebleeds (nasal spray)   Morphine And Related Itching and Other (See Comments)    Ineffective/itching   Patient Active Problem List   Diagnosis Date Noted   Gastroesophageal reflux disease without esophagitis 12/27/2021   Lower abdominal pain 12/27/2021   Generalized abdominal pain 12/27/2021   History of constipation 12/27/2021   Nausea without vomiting 12/27/2021   Mouth dryness 04/17/2021   Rash and other nonspecific skin eruption 04/17/2021   Bilateral hand pain 04/17/2021   Dizziness 12/17/2020   Fibromyalgia syndrome 12/17/2020   Anxiety 10/08/2020   Vitamin D deficiency 10/08/2020   Facial rash 09/17/2020   Positive ANA (antinuclear antibody) 09/17/2020   Livedo reticularis without ulceration 09/17/2020   Paresthesias 09/17/2020   Elevated LDL cholesterol level 07/12/2020   ADHD 07/11/2020   Chest pain 07/10/2020   Arthralgia 03/10/2014   Abdominal pain 03/10/2014   Headache 03/10/2014   Depression 03/10/2014   Reflux esophagitis 03/10/2014   Rectal pain 02/15/2014   Menorrhagia    Social History   Socioeconomic History   Marital status: Married    Spouse name: Not on file   Number of  children: Not on file   Years of education: Not on file   Highest education level: Not on file  Occupational History   Not on file  Tobacco Use   Smoking status: Former    Types: Cigarettes   Smokeless tobacco: Never   Tobacco comments:    Social smoker  Vaping Use   Vaping Use: Never used  Substance and Sexual Activity   Alcohol use: Yes    Comment: Occasionally   Drug use: No   Sexual activity: Yes    Partners: Male    Birth control/protection: Surgical    Comment: vas   Other Topics Concern   Not on file  Social History Narrative   Daughter of Dr. 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 Bisesi's family history includes Angina in her paternal grandmother; Arrhythmia in her paternal grandmother; Breast cancer in her paternal grandmother; Cancer (age of onset: 44) in her paternal grandmother; Diabetes in her son; Hyperlipidemia in her paternal grandmother; Hypothyroidism in her mother; Stroke in her paternal grandmother.      Objective:    Vitals:   10/20/22 1148  BP: 100/70  Pulse: (!) 113    Physical Exam Well-developed well-nourished white female in no acute distress.  Height, Weight, 120 BMI 19.4  HEENT; nontraumatic normocephalic, EOMI, PE R LA, sclera anicteric. Oropharynx; not examined today Neck; supple, no JVD Cardiovascular; regular rate and rhythm with S1-S2, no murmur rub or gallop Pulmonary; Clear bilaterally Abdomen; soft, there is tenderness in the epigastrium and right upper quadrant no guarding or rebound nondistended, no palpable mass or hepatosplenomegaly, bowel sounds are active Rectal; not done today Skin; benign exam, no  jaundice rash or appreciable lesions Extremities; no clubbing cyanosis or edema skin warm and dry Neuro/Psych; alert and oriented x4, grossly nonfocal mood and affect appropriate ,but flat       Assessment & Plan:   #31 46 year old white female with history of chronic GERD, who comes in with follow-up and also has complaints of some recent epigastric and right upper quadrant discomfort.  She had also lost a few pounds over the past couple of months but says she is gaining at this point.  Denied any nausea or vomiting.  CT from January 2023 had shown an enlarged liver, and a cystic lesion in the midline pelvis most consistent with an ovarian cyst. Patient says she did have GYN follow-up and the ovarian cyst resolved.  She is tender today on exam in the epigastrium and right upper quadrant.  Etiology not clear. Rule out gastropathy, peptic ulcer disease, gallbladder disease, rule out persistent hepatomegaly though not palpable on exam.  Rule out other intra-abdominal inflammatory process  #2 chronic GERD-stable on twice daily Protonix #3 ADHD 4.  Depression 5.  Fibromyalgia #6 history of hepatomegaly  Plan; Patient will be scheduled for CT of the abdomen with contrast Refill Protonix 40 mg p.o. twice daily AC Will check CBC with differential, c-Met today If above workup is unrevealing and symptoms are persisting we will need to consider repeat EGD with Dr. Rush Landmark  Elizabeth Paulsen Genia Harold PA-C 10/20/2022   Cc: No ref. provider found

## 2022-10-20 NOTE — Progress Notes (Signed)
Attending Physician's Attestation   I have reviewed the chart.   I agree with the Advanced Practitioner's note, impression, and recommendations with any updates as below.    Licia Harl [name], MD [facility] Advanced [facility]oscopy Office # [phone]  

## 2022-10-21 ENCOUNTER — Ambulatory Visit (HOSPITAL_COMMUNITY)
Admission: RE | Admit: 2022-10-21 | Discharge: 2022-10-21 | Disposition: A | Discharge status: Home / Self Care | Payer: BC Managed Care – PPO | Source: Ambulatory Visit | Attending: Physician Assistant | Admitting: Physician Assistant

## 2022-10-21 DIAGNOSIS — Z87898 Personal history of other specified conditions: Secondary | ICD-10-CM | POA: Insufficient documentation

## 2022-10-21 DIAGNOSIS — K219 Gastro-esophageal reflux disease without esophagitis: Secondary | ICD-10-CM | POA: Diagnosis not present

## 2022-10-21 DIAGNOSIS — R109 Unspecified abdominal pain: Secondary | ICD-10-CM | POA: Diagnosis not present

## 2022-10-21 DIAGNOSIS — R1010 Upper abdominal pain, unspecified: Secondary | ICD-10-CM | POA: Diagnosis not present

## 2022-10-21 DIAGNOSIS — R634 Abnormal weight loss: Secondary | ICD-10-CM | POA: Insufficient documentation

## 2022-10-21 DIAGNOSIS — F332 Major depressive disorder, recurrent severe without psychotic features: Secondary | ICD-10-CM | POA: Diagnosis not present

## 2022-10-21 MED ORDER — SODIUM CHLORIDE (PF) 0.9 % IJ SOLN
INTRAMUSCULAR | Status: AC
  Filled 2022-10-21: qty 50

## 2022-10-21 MED ORDER — IOHEXOL 300 MG/ML  SOLN
100.0000 mL | Freq: Once | INTRAMUSCULAR | Status: AC | PRN
  Administered 2022-10-21: 100 mL via INTRAVENOUS

## 2022-10-22 DIAGNOSIS — F331 Major depressive disorder, recurrent, moderate: Secondary | ICD-10-CM | POA: Diagnosis not present

## 2022-10-26 DIAGNOSIS — F331 Major depressive disorder, recurrent, moderate: Secondary | ICD-10-CM | POA: Diagnosis not present

## 2022-10-28 DIAGNOSIS — F331 Major depressive disorder, recurrent, moderate: Secondary | ICD-10-CM | POA: Diagnosis not present

## 2022-10-29 DIAGNOSIS — F331 Major depressive disorder, recurrent, moderate: Secondary | ICD-10-CM | POA: Diagnosis not present

## 2022-11-01 DIAGNOSIS — F331 Major depressive disorder, recurrent, moderate: Secondary | ICD-10-CM | POA: Diagnosis not present

## 2022-11-02 DIAGNOSIS — F331 Major depressive disorder, recurrent, moderate: Secondary | ICD-10-CM | POA: Diagnosis not present

## 2022-11-03 DIAGNOSIS — F4324 Adjustment disorder with disturbance of conduct: Secondary | ICD-10-CM | POA: Diagnosis not present

## 2022-11-04 DIAGNOSIS — F909 Attention-deficit hyperactivity disorder, unspecified type: Secondary | ICD-10-CM | POA: Diagnosis not present

## 2022-11-04 DIAGNOSIS — F332 Major depressive disorder, recurrent severe without psychotic features: Secondary | ICD-10-CM | POA: Diagnosis not present

## 2022-11-04 DIAGNOSIS — F411 Generalized anxiety disorder: Secondary | ICD-10-CM | POA: Diagnosis not present

## 2022-11-05 DIAGNOSIS — F331 Major depressive disorder, recurrent, moderate: Secondary | ICD-10-CM | POA: Diagnosis not present

## 2022-11-08 DIAGNOSIS — F331 Major depressive disorder, recurrent, moderate: Secondary | ICD-10-CM | POA: Diagnosis not present

## 2022-11-09 DIAGNOSIS — F331 Major depressive disorder, recurrent, moderate: Secondary | ICD-10-CM | POA: Diagnosis not present

## 2022-11-10 DIAGNOSIS — F331 Major depressive disorder, recurrent, moderate: Secondary | ICD-10-CM | POA: Diagnosis not present

## 2022-11-11 DIAGNOSIS — F331 Major depressive disorder, recurrent, moderate: Secondary | ICD-10-CM | POA: Diagnosis not present

## 2022-11-12 DIAGNOSIS — F331 Major depressive disorder, recurrent, moderate: Secondary | ICD-10-CM | POA: Diagnosis not present

## 2022-11-15 DIAGNOSIS — F331 Major depressive disorder, recurrent, moderate: Secondary | ICD-10-CM | POA: Diagnosis not present

## 2022-11-16 DIAGNOSIS — F331 Major depressive disorder, recurrent, moderate: Secondary | ICD-10-CM | POA: Diagnosis not present

## 2022-11-17 DIAGNOSIS — F331 Major depressive disorder, recurrent, moderate: Secondary | ICD-10-CM | POA: Diagnosis not present

## 2022-11-18 DIAGNOSIS — F331 Major depressive disorder, recurrent, moderate: Secondary | ICD-10-CM | POA: Diagnosis not present

## 2022-11-19 DIAGNOSIS — F331 Major depressive disorder, recurrent, moderate: Secondary | ICD-10-CM | POA: Diagnosis not present

## 2022-11-22 DIAGNOSIS — Z01419 Encounter for gynecological examination (general) (routine) without abnormal findings: Secondary | ICD-10-CM | POA: Diagnosis not present

## 2022-11-22 DIAGNOSIS — F331 Major depressive disorder, recurrent, moderate: Secondary | ICD-10-CM | POA: Diagnosis not present

## 2022-11-22 DIAGNOSIS — Z1231 Encounter for screening mammogram for malignant neoplasm of breast: Secondary | ICD-10-CM | POA: Diagnosis not present

## 2022-11-23 DIAGNOSIS — F331 Major depressive disorder, recurrent, moderate: Secondary | ICD-10-CM | POA: Diagnosis not present

## 2022-11-24 DIAGNOSIS — F331 Major depressive disorder, recurrent, moderate: Secondary | ICD-10-CM | POA: Diagnosis not present

## 2022-11-26 DIAGNOSIS — F331 Major depressive disorder, recurrent, moderate: Secondary | ICD-10-CM | POA: Diagnosis not present

## 2022-11-29 DIAGNOSIS — F331 Major depressive disorder, recurrent, moderate: Secondary | ICD-10-CM | POA: Diagnosis not present

## 2022-12-03 DIAGNOSIS — F331 Major depressive disorder, recurrent, moderate: Secondary | ICD-10-CM | POA: Diagnosis not present

## 2022-12-06 DIAGNOSIS — F331 Major depressive disorder, recurrent, moderate: Secondary | ICD-10-CM | POA: Diagnosis not present

## 2022-12-07 DIAGNOSIS — F331 Major depressive disorder, recurrent, moderate: Secondary | ICD-10-CM | POA: Diagnosis not present

## 2022-12-09 DIAGNOSIS — J329 Chronic sinusitis, unspecified: Secondary | ICD-10-CM | POA: Diagnosis not present

## 2022-12-16 DIAGNOSIS — F332 Major depressive disorder, recurrent severe without psychotic features: Secondary | ICD-10-CM | POA: Diagnosis not present

## 2022-12-16 DIAGNOSIS — F411 Generalized anxiety disorder: Secondary | ICD-10-CM | POA: Diagnosis not present

## 2022-12-16 DIAGNOSIS — F909 Attention-deficit hyperactivity disorder, unspecified type: Secondary | ICD-10-CM | POA: Diagnosis not present

## 2023-02-23 ENCOUNTER — Encounter (HOSPITAL_BASED_OUTPATIENT_CLINIC_OR_DEPARTMENT_OTHER): Payer: Self-pay | Admitting: Pharmacist

## 2023-02-23 ENCOUNTER — Other Ambulatory Visit (HOSPITAL_BASED_OUTPATIENT_CLINIC_OR_DEPARTMENT_OTHER): Payer: Self-pay

## 2023-02-23 DIAGNOSIS — F332 Major depressive disorder, recurrent severe without psychotic features: Secondary | ICD-10-CM | POA: Diagnosis not present

## 2023-02-23 DIAGNOSIS — F909 Attention-deficit hyperactivity disorder, unspecified type: Secondary | ICD-10-CM | POA: Diagnosis not present

## 2023-02-23 DIAGNOSIS — F411 Generalized anxiety disorder: Secondary | ICD-10-CM | POA: Diagnosis not present

## 2023-02-23 MED ORDER — DESVENLAFAXINE SUCCINATE ER 50 MG PO TB24
ORAL_TABLET | ORAL | 0 refills | Status: DC
  Filled 2023-02-23 – 2023-05-19 (×2): qty 90, 90d supply, fill #0

## 2023-02-23 MED ORDER — DESVENLAFAXINE SUCCINATE ER 100 MG PO TB24
100.0000 mg | ORAL_TABLET | Freq: Every day | ORAL | 0 refills | Status: DC
  Filled 2023-02-23 – 2023-05-19 (×2): qty 90, 90d supply, fill #0

## 2023-02-23 MED ORDER — LISDEXAMFETAMINE DIMESYLATE 30 MG PO CAPS
30.0000 mg | ORAL_CAPSULE | Freq: Every morning | ORAL | 0 refills | Status: DC
  Filled 2023-02-23: qty 30, 30d supply, fill #0

## 2023-02-23 MED ORDER — DESVENLAFAXINE SUCCINATE ER 100 MG PO TB24
100.0000 mg | ORAL_TABLET | Freq: Every day | ORAL | 0 refills | Status: DC
  Filled 2023-02-23: qty 90, 90d supply, fill #0

## 2023-02-23 MED ORDER — TRAZODONE HCL 100 MG PO TABS
200.0000 mg | ORAL_TABLET | Freq: Every evening | ORAL | 0 refills | Status: DC | PRN
  Filled 2023-02-23: qty 180, 90d supply, fill #0

## 2023-02-23 MED ORDER — CLONAZEPAM 1 MG PO TABS
1.0000 mg | ORAL_TABLET | Freq: Every day | ORAL | 2 refills | Status: AC
  Filled 2023-02-23: qty 30, 30d supply, fill #0
  Filled 2023-03-24: qty 30, 30d supply, fill #1
  Filled 2023-04-27: qty 30, 30d supply, fill #2

## 2023-02-24 ENCOUNTER — Other Ambulatory Visit (HOSPITAL_BASED_OUTPATIENT_CLINIC_OR_DEPARTMENT_OTHER): Payer: Self-pay

## 2023-03-17 ENCOUNTER — Other Ambulatory Visit (HOSPITAL_BASED_OUTPATIENT_CLINIC_OR_DEPARTMENT_OTHER): Payer: Self-pay

## 2023-03-18 ENCOUNTER — Other Ambulatory Visit (HOSPITAL_BASED_OUTPATIENT_CLINIC_OR_DEPARTMENT_OTHER): Payer: Self-pay

## 2023-03-24 ENCOUNTER — Other Ambulatory Visit (HOSPITAL_BASED_OUTPATIENT_CLINIC_OR_DEPARTMENT_OTHER): Payer: Self-pay

## 2023-03-28 ENCOUNTER — Other Ambulatory Visit: Payer: Self-pay

## 2023-03-29 ENCOUNTER — Other Ambulatory Visit (HOSPITAL_BASED_OUTPATIENT_CLINIC_OR_DEPARTMENT_OTHER): Payer: Self-pay

## 2023-03-29 MED ORDER — LISDEXAMFETAMINE DIMESYLATE 30 MG PO CAPS
30.0000 mg | ORAL_CAPSULE | Freq: Every morning | ORAL | 0 refills | Status: AC
  Filled 2023-04-27: qty 30, 30d supply, fill #0

## 2023-03-29 MED ORDER — LISDEXAMFETAMINE DIMESYLATE 30 MG PO CAPS
30.0000 mg | ORAL_CAPSULE | Freq: Every morning | ORAL | 0 refills | Status: DC
  Filled 2023-03-29: qty 30, 30d supply, fill #0

## 2023-04-05 DIAGNOSIS — M79641 Pain in right hand: Secondary | ICD-10-CM | POA: Diagnosis not present

## 2023-04-05 DIAGNOSIS — M25561 Pain in right knee: Secondary | ICD-10-CM | POA: Diagnosis not present

## 2023-04-05 DIAGNOSIS — M79671 Pain in right foot: Secondary | ICD-10-CM | POA: Diagnosis not present

## 2023-04-05 DIAGNOSIS — M25562 Pain in left knee: Secondary | ICD-10-CM | POA: Diagnosis not present

## 2023-04-05 DIAGNOSIS — A6920 Lyme disease, unspecified: Secondary | ICD-10-CM | POA: Diagnosis not present

## 2023-04-05 DIAGNOSIS — M1990 Unspecified osteoarthritis, unspecified site: Secondary | ICD-10-CM | POA: Diagnosis not present

## 2023-04-05 DIAGNOSIS — M359 Systemic involvement of connective tissue, unspecified: Secondary | ICD-10-CM | POA: Diagnosis not present

## 2023-04-05 DIAGNOSIS — M79642 Pain in left hand: Secondary | ICD-10-CM | POA: Diagnosis not present

## 2023-04-05 DIAGNOSIS — M79672 Pain in left foot: Secondary | ICD-10-CM | POA: Diagnosis not present

## 2023-04-05 DIAGNOSIS — M797 Fibromyalgia: Secondary | ICD-10-CM | POA: Diagnosis not present

## 2023-04-15 ENCOUNTER — Other Ambulatory Visit (HOSPITAL_BASED_OUTPATIENT_CLINIC_OR_DEPARTMENT_OTHER): Payer: Self-pay

## 2023-04-18 ENCOUNTER — Other Ambulatory Visit (HOSPITAL_BASED_OUTPATIENT_CLINIC_OR_DEPARTMENT_OTHER): Payer: Self-pay

## 2023-04-19 DIAGNOSIS — M359 Systemic involvement of connective tissue, unspecified: Secondary | ICD-10-CM | POA: Diagnosis not present

## 2023-04-19 DIAGNOSIS — M1990 Unspecified osteoarthritis, unspecified site: Secondary | ICD-10-CM | POA: Diagnosis not present

## 2023-04-19 DIAGNOSIS — A6920 Lyme disease, unspecified: Secondary | ICD-10-CM | POA: Diagnosis not present

## 2023-04-19 DIAGNOSIS — M3500 Sicca syndrome, unspecified: Secondary | ICD-10-CM | POA: Diagnosis not present

## 2023-04-27 ENCOUNTER — Other Ambulatory Visit (HOSPITAL_BASED_OUTPATIENT_CLINIC_OR_DEPARTMENT_OTHER): Payer: Self-pay

## 2023-05-19 ENCOUNTER — Other Ambulatory Visit (HOSPITAL_BASED_OUTPATIENT_CLINIC_OR_DEPARTMENT_OTHER): Payer: Self-pay

## 2023-05-20 ENCOUNTER — Other Ambulatory Visit (HOSPITAL_BASED_OUTPATIENT_CLINIC_OR_DEPARTMENT_OTHER): Payer: Self-pay

## 2023-05-30 ENCOUNTER — Other Ambulatory Visit (HOSPITAL_BASED_OUTPATIENT_CLINIC_OR_DEPARTMENT_OTHER): Payer: Self-pay

## 2023-08-03 ENCOUNTER — Other Ambulatory Visit (HOSPITAL_BASED_OUTPATIENT_CLINIC_OR_DEPARTMENT_OTHER): Payer: Self-pay

## 2023-08-03 MED ORDER — LISDEXAMFETAMINE DIMESYLATE 30 MG PO CAPS
30.0000 mg | ORAL_CAPSULE | Freq: Every morning | ORAL | 0 refills | Status: DC
  Filled 2023-08-03 – 2023-08-08 (×2): qty 30, 30d supply, fill #0

## 2023-08-08 ENCOUNTER — Other Ambulatory Visit (HOSPITAL_BASED_OUTPATIENT_CLINIC_OR_DEPARTMENT_OTHER): Payer: Self-pay

## 2023-09-09 ENCOUNTER — Other Ambulatory Visit: Payer: Self-pay

## 2023-10-24 ENCOUNTER — Other Ambulatory Visit: Payer: Self-pay | Admitting: Obstetrics and Gynecology

## 2023-10-24 DIAGNOSIS — Z1231 Encounter for screening mammogram for malignant neoplasm of breast: Secondary | ICD-10-CM

## 2023-11-16 ENCOUNTER — Other Ambulatory Visit: Payer: Self-pay | Admitting: Physician Assistant

## 2024-01-03 ENCOUNTER — Telehealth: Payer: Self-pay | Admitting: Physician Assistant

## 2024-01-03 NOTE — Telephone Encounter (Signed)
 PT is calling to speak with a nurse regarding symptoms. She has had a lot of stomach pain and no appetite at all. Please advise.

## 2024-01-03 NOTE — Telephone Encounter (Signed)
 Spoke with the patient. She is having an increase in her GERD symptoms. States "out of control." Patient has a desire to be seen in clinic for evaluation.  Wait listed for 01/11/24 at 1:30 pm with Doug Sou, PA-C.

## 2024-01-04 NOTE — Telephone Encounter (Signed)
Patient added to the schedule

## 2024-01-10 NOTE — Telephone Encounter (Signed)
 Called the patient back. No answer. Voicemail is full. She can be rescheduled or wait listed. Whichever works better for her.

## 2024-01-10 NOTE — Telephone Encounter (Signed)
 Inbound call from patient stating she no longer will be able to make 3/19 appointment. Requesting a call to discuss if there is an alternative. Please advise, thank you.

## 2024-01-11 ENCOUNTER — Ambulatory Visit: Admitting: Gastroenterology

## 2024-04-09 ENCOUNTER — Other Ambulatory Visit (HOSPITAL_BASED_OUTPATIENT_CLINIC_OR_DEPARTMENT_OTHER): Payer: Self-pay

## 2024-04-20 DIAGNOSIS — L281 Prurigo nodularis: Secondary | ICD-10-CM | POA: Insufficient documentation

## 2024-05-15 ENCOUNTER — Other Ambulatory Visit (HOSPITAL_BASED_OUTPATIENT_CLINIC_OR_DEPARTMENT_OTHER): Payer: Self-pay

## 2024-05-19 ENCOUNTER — Other Ambulatory Visit (HOSPITAL_BASED_OUTPATIENT_CLINIC_OR_DEPARTMENT_OTHER): Payer: Self-pay

## 2024-05-24 DIAGNOSIS — M25531 Pain in right wrist: Secondary | ICD-10-CM | POA: Insufficient documentation

## 2024-06-02 ENCOUNTER — Other Ambulatory Visit (HOSPITAL_BASED_OUTPATIENT_CLINIC_OR_DEPARTMENT_OTHER): Payer: Self-pay

## 2024-06-06 ENCOUNTER — Telehealth: Payer: Self-pay

## 2024-06-06 NOTE — Telephone Encounter (Signed)
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 She needs a followup with me or APP in my Pod as I am hearing she is having further issues to discuss. Thanks. GM

## 2024-06-07 NOTE — Telephone Encounter (Signed)
 Left message on machine to call back

## 2024-06-08 NOTE — Telephone Encounter (Signed)
 Shon I am off next week can you please try to reach this pt and set her up for an appt with Dr Wilhelmenia or one of the POD C apps?  Thank you

## 2024-06-08 NOTE — Telephone Encounter (Signed)
 Left message on machine to call back

## 2024-06-13 NOTE — Telephone Encounter (Signed)
 PT scheduled for 8/27

## 2024-06-20 ENCOUNTER — Ambulatory Visit: Admitting: Gastroenterology

## 2024-08-08 ENCOUNTER — Ambulatory Visit: Admitting: Gastroenterology

## 2024-08-08 NOTE — Progress Notes (Deleted)
 Lindsay Giles [id number] 21-May-1976   Chief Complaint:  Referring Provider: No ref. provider found Primary GI MD: Dr. Wilhelmenia  HPI: Lindsay Giles is a 48 y.o. female with past medical history of ADD, anemia, anxiety and depression, gastritis, GERD, hysterectomy who presents today for a complaint of *** .    Last seen in office 10/20/2022 by Greig Corti, PA-C.  Was having some epigastric and right upper quadrant discomfort at that time.  CT from January 2023 showed an enlarged liver, cystic lesion in the midline pelvis most consistent with an ovarian cyst.  Had follow-up with gynecology and the cyst resolved.  Less tender on exam of the epigastrium and right upper quadrant, etiology not clear.  Was scheduled for CT of the abdomen, Protonix  40 mg twice daily refilled, consideration for EGD if workup unrevealing.  CT showed no acute findings, and no comment about an enlarged liver so that may have been a transient finding.   Note that she has a complication of anesthesia?    Previous GI Procedures/Imaging   CT A/P 10/21/2022 Negative. No acute findings or other significant abnormality.   Colonoscopy 09/09/2020 - Hemorrhoids found on digital rectal exam.  - The examined portion of the ileum was normal.  - Normal mucosa in the entire examined colon.  - Non- bleeding non- thrombosed external and internal hemorrhoids. - Recall 10 years  EGD 09/09/2020 - No gross lesions in esophagus. Biopsied. Dilated. - 2 cm hiatal hernia. - No other gross lesions in the stomach. Biopsied. - No gross lesions in the duodenal bulb, in the first portion of the duodenum and in the second portion of the duodenum. Biopsied.   Past Medical History:  Diagnosis Date   Abnormal Pap smear    ADD (attention deficit disorder)    Anemia    Anxiety and depression    Complication of anesthesia    Dysrhythmia    Gastritis    Per patient   GERD (gastroesophageal reflux disease)    Menorrhagia     Pneumonia    PONV (postoperative nausea and vomiting)    Rapid heart rate    Tachycardia     Past Surgical History:  Procedure Laterality Date   BILATERAL SALPINGECTOMY Bilateral 10/13/2020   Procedure: BILATERAL SALPINGECTOMY;  Surgeon: Darcel Pool, MD;  Location: [facility];  Service: Gynecology;  Laterality: Bilateral;  VAGINAL   BREAST REDUCTION SURGERY     CERVICAL ABLATION     pt denies having had d&c   DILATION AND CURETTAGE OF UTERUS     pt stateds did not have d & c , but rather a carvical ablation   ESOPHAGEAL DILATION  08/2020   EXAMINATION UNDER ANESTHESIA N/A 02/17/2014   Procedure: EXAM UNDER ANESTHESIA,  ULTRASOUND OF RECTUM, INCISION AND EXCISION OF THROMBOSED CLOT ;  Surgeon: Alm VEAR Angle, MD;  Location: [facility];  Service: General;  Laterality: N/A;   KNEE SURGERY     right hand surgery     neuroma & groth on bone removed    TONSILLECTOMY AND ADENOIDECTOMY     VAGINAL HYSTERECTOMY N/A 10/13/2020   Procedure: HYSTERECTOMY VAGINAL;  Surgeon: Darcel Pool, MD;  Location: [facility];  Service: Gynecology;  Laterality: N/A;    Current Outpatient Medications  Medication Sig Dispense Refill   atomoxetine (STRATTERA) 25 MG capsule Take 25 mg by mouth 2 (two) times daily.     celecoxib  (CELEBREX ) 200 MG capsule Take 200 mg by mouth as  needed.     clonazePAM  (KLONOPIN ) 0.5 MG tablet Take 0.5 mg by mouth 2 (two) times daily as needed for anxiety.     clonazePAM  (KLONOPIN ) 1 MG tablet Take 1 tablet (1 mg total) by mouth daily for severe panic attacks for anxiety. 30 tablet 2   Cyanocobalamin  (VITAMIN B-12) 5000 MCG TBDP Take 5,000 mcg by mouth daily.     desvenlafaxine  (PRISTIQ ) 100 MG 24 hr tablet Take 100 mg by mouth at bedtime.      desvenlafaxine  (PRISTIQ ) 100 MG 24 hr tablet Take 1 tablet (100 mg total) by mouth daily for anxiety or depression. 90 tablet 0   desvenlafaxine  (PRISTIQ ) 100 MG 24 hr tablet Take 1 tablet(s) by mouth  daily for anxiety/depression 90 tablet 0   desvenlafaxine  (PRISTIQ ) 50 MG 24 hr tablet Take 1 tablet(s) by mouth daily for anxiety/depression 90 tablet 0   Docusate Calcium (STOOL SOFTENER PO) Take 1 capsule by mouth daily. *takes stool softener daily, takes additional docusate as needed     doxycycline (VIBRAMYCIN) 100 MG capsule Take 100 mg by mouth 2 (two) times daily.     hydrOXYzine  (ATARAX ) 10 MG tablet Take 10 mg by mouth 3 (three) times daily as needed.     lisdexamfetamine (VYVANSE ) 30 MG capsule Take 1 capsule(s) by mouth every morning for ADHD 30 capsule 0   lisdexamfetamine (VYVANSE ) 30 MG capsule Take 1 capsule(s) by mouth every morning for ADHD 30 capsule 0   lisdexamfetamine (VYVANSE ) 30 MG capsule Take 1 capsule (30 mg total) by mouth in the morning. 30 capsule 0   lisdexamfetamine (VYVANSE ) 40 MG capsule Take 1 capsule (40 mg total) by mouth every morning. (Patient not taking: Reported on 10/20/2022) 30 capsule 0   metaxalone (SKELAXIN) 800 MG tablet Take 1 tablet by mouth 3 (three) times daily as needed.     methylcellulose (CITRUCEL) oral powder Take 1 packet by mouth daily. (Patient not taking: Reported on 10/20/2022)     naproxen  (NAPROSYN ) 500 MG tablet Take 1 tablet (500 mg total) by mouth 2 (two) times daily. (Patient not taking: Reported on 10/20/2022) 30 tablet 0   Omega-3 Fatty Acids (FISH OIL PO) Take 1 capsule by mouth daily.     ondansetron  (ZOFRAN ) 8 MG tablet Take 1 tablet (8 mg total) by mouth daily as needed for nausea. 20 tablet 0   oxyCODONE  (ROXICODONE ) 5 MG immediate release tablet Take 1 tablet (5 mg total) by mouth every 4 (four) hours as needed for severe pain. (Patient not taking: Reported on 10/20/2022) 15 tablet 0   pantoprazole  (PROTONIX ) 40 MG tablet TAKE 1 TABLET(40 MG) BY MOUTH TWICE DAILY BEFORE A MEAL 60 tablet 11   traZODone  (DESYREL ) 100 MG tablet Take 200 mg by mouth at bedtime.      traZODone  (DESYREL ) 100 MG tablet Take 2 tablet(s) by mouth at  bedtime as needed for sleep 180 tablet 0   VITAMIN D PO Take 3 capsules by mouth daily.     No current facility-administered medications for this visit.    Allergies as of 08/08/2024 - Review Complete 10/20/2022  Allergen Reaction Noted   Atrovent [ipratropium] Other (See Comments) 07/27/2012   Morphine  and codeine Itching and Other (See Comments) 09/23/2020    Family History  Problem Relation Age of Onset   Hypothyroidism Mother    Breast cancer Paternal Grandmother    Cancer Paternal Grandmother 31       breast   Arrhythmia Paternal Grandmother  Hyperlipidemia Paternal Grandmother    Angina Paternal Grandmother    Stroke Paternal Grandmother    Diabetes Son    Colon cancer Neg Hx    Colon polyps Neg Hx    Esophageal cancer Neg Hx    Stomach cancer Neg Hx    Rectal cancer Neg Hx    Inflammatory bowel disease Neg Hx    Liver disease Neg Hx    Pancreatic cancer Neg Hx     Social History   Tobacco Use   Smoking status: Former    Types: Cigarettes   Smokeless tobacco: Never   Tobacco comments:    Social smoker  Vaping Use   Vaping status: Never Used  Substance Use Topics   Alcohol use: Yes    Comment: Occasionally   Drug use: No     Review of Systems:    Constitutional: No weight loss, fever, chills, weakness or fatigue Eyes: No change in vision Ears, Nose, Throat:  No change in hearing or congestion Skin: No rash or itching Cardiovascular: No chest pain, chest pressure or palpitations   Respiratory: No SOB or cough Gastrointestinal: See HPI and otherwise negative Genitourinary: No dysuria or change in urinary frequency Neurological: No headache, dizziness or syncope Musculoskeletal: No new muscle or joint pain Hematologic: No bleeding or bruising    Physical Exam:  Vital signs: LMP 09/07/2020   Constitutional: NAD, Well developed, Well nourished, alert and cooperative Head:  Normocephalic and atraumatic.  Eyes: No scleral icterus. Conjunctiva  pink. Mouth: No oral lesions. Respiratory: Respirations even and unlabored. Lungs clear to auscultation bilaterally.  No wheezes, crackles, or rhonchi.  Cardiovascular:  Regular rate and rhythm. No murmurs. No peripheral edema. Gastrointestinal:  Soft, nondistended, nontender. No rebound or guarding. Normal bowel sounds. No appreciable masses or hepatomegaly. Rectal:  Not performed.  Neurologic:  Alert and oriented x4;  grossly normal neurologically.  Skin:   Dry and intact without significant lesions or rashes. Psychiatric: Oriented to person, place and time. Demonstrates good judgement and reason without abnormal affect or behaviors.   RELEVANT LABS AND IMAGING: CBC    Component Value Date/Time   WBC 10.1 10/20/2022 1243   RBC 4.50 10/20/2022 1243   HGB 13.7 10/20/2022 1243   HCT 41.2 10/20/2022 1243   PLT 320.0 10/20/2022 1243   MCV 91.7 10/20/2022 1243   MCV 93.2 10/11/2013 1938   MCH 31.5 06/24/2022 0110   MCHC 33.3 10/20/2022 1243   RDW 12.2 10/20/2022 1243   LYMPHSABS 1.6 10/20/2022 1243   MONOABS 0.5 10/20/2022 1243   EOSABS 0.1 10/20/2022 1243   BASOSABS 0.0 10/20/2022 1243    CMP     Component Value Date/Time   NA 138 10/20/2022 1243   K 4.1 10/20/2022 1243   CL 104 10/20/2022 1243   CO2 29 10/20/2022 1243   GLUCOSE 96 10/20/2022 1243   BUN 8 10/20/2022 1243   CREATININE 0.61 10/20/2022 1243   CALCIUM 9.2 10/20/2022 1243   PROT 7.3 10/20/2022 1243   ALBUMIN 4.4 10/20/2022 1243   AST 17 10/20/2022 1243   ALT 20 10/20/2022 1243   ALKPHOS 49 10/20/2022 1243   BILITOT 0.3 10/20/2022 1243   GFRNONAA >60 06/24/2022 0110   GFRAA >60 07/10/2020 1727   Echocardiogram 07/10/2020 1. Left ventricular ejection fraction, by estimation, is 60 to 65% . The left ventricle has normal function. The left ventricle has no regional wall motion abnormalities. Left ventricular diastolic parameters were normal. 2. Right ventricular systolic function is normal.  The right  ventricular size is normal. Tricuspid regurgitation signal is inadequate for assessing PA pressure.  3. The mitral valve is normal in structure. Trivial mitral valve regurgitation. No evidence of mitral stenosis.  4. The aortic valve is normal in structure. Aortic valve regurgitation is not visualized. No aortic stenosis is present.  5. The inferior vena cava is normal in size with greater than 50% respiratory variability, suggesting right atrial pressure of 3 mmHg.   Assessment/Plan:       Lindsay Furbish, PA-C [facility] 08/08/2024, 1:08 PM  Patient Care Team: Pcp, No as PCP - General Jeffrie Oneil BROCKS, MD as PCP - Cardiology (Cardiology) Celestia Agent, MD (Inactive) as Consulting Physician (Gastroenterology) Darcel Pool, MD as Consulting Physician (Obstetrics and Gynecology) Ethyl Lenis, MD as Consulting Physician (General Surgery)

## 2024-08-29 NOTE — Progress Notes (Unsigned)
 Lindsay Giles [id number] 05-10-1976   Chief Complaint:  Referring Provider: No ref. provider found Primary GI MD: Dr. Wilhelmenia   HPI: Lindsay Giles is a 48 y.o. female with past medical history of ADD, anemia, anxiety and depression, gastritis, GERD, hysterectomy  who presents today for a complaint of *** .    Last seen in office 10/20/2022 by Greig Corti, PA-C.  Was having some epigastric and right upper quadrant discomfort at that time.  CT from January 2023 showed an enlarged liver, cystic lesion in the midline pelvis most consistent with an ovarian cyst.  Had follow-up with gynecology and the cyst resolved.  Less tender on exam of the epigastrium and right upper quadrant, etiology not clear.  Was scheduled for CT of the abdomen, Protonix  40 mg twice daily refilled, consideration for EGD if workup unrevealing.   CT showed no acute findings, and no comment about an enlarged liver so that may have been a transient finding.     Note that she has a complication of anesthesia?   Discussed the use of AI scribe software for clinical note transcription with the patient, who gave verbal consent to proceed.  History of Present Illness       Previous GI Procedures/Imaging   CT A/P 10/21/2022 Negative. No acute findings or other significant abnormality.    Colonoscopy 09/09/2020 - Hemorrhoids found on digital rectal exam.  - The examined portion of the ileum was normal.  - Normal mucosa in the entire examined colon.  - Non- bleeding non- thrombosed external and internal hemorrhoids. - Recall 10 years   EGD 09/09/2020 - No gross lesions in esophagus. Biopsied. Dilated. - 2 cm hiatal hernia. - No other gross lesions in the stomach. Biopsied. - No gross lesions in the duodenal bulb, in the first portion of the duodenum and in the second portion of the duodenum. Biopsied.   Past Medical History:  Diagnosis Date   Abnormal Pap smear    ADD (attention deficit disorder)     Anemia    Anxiety and depression    Complication of anesthesia    Dysrhythmia    Gastritis    Per patient   GERD (gastroesophageal reflux disease)    Menorrhagia    Pneumonia    PONV (postoperative nausea and vomiting)    Rapid heart rate    Tachycardia     Past Surgical History:  Procedure Laterality Date   BILATERAL SALPINGECTOMY Bilateral 10/13/2020   Procedure: BILATERAL SALPINGECTOMY;  Surgeon: Darcel Pool, MD;  Location: [facility];  Service: Gynecology;  Laterality: Bilateral;  VAGINAL   BREAST REDUCTION SURGERY     CERVICAL ABLATION     pt denies having had d&c   DILATION AND CURETTAGE OF UTERUS     pt stateds did not have d & c , but rather a carvical ablation   ESOPHAGEAL DILATION  08/2020   EXAMINATION UNDER ANESTHESIA N/A 02/17/2014   Procedure: EXAM UNDER ANESTHESIA,  ULTRASOUND OF RECTUM, INCISION AND EXCISION OF THROMBOSED CLOT ;  Surgeon: Alm VEAR Angle, MD;  Location: [facility];  Service: General;  Laterality: N/A;   KNEE SURGERY     right hand surgery     neuroma & groth on bone removed    TONSILLECTOMY AND ADENOIDECTOMY     VAGINAL HYSTERECTOMY N/A 10/13/2020   Procedure: HYSTERECTOMY VAGINAL;  Surgeon: Darcel Pool, MD;  Location: [facility];  Service: Gynecology;  Laterality: N/A;    Current Outpatient  Medications  Medication Sig Dispense Refill   atomoxetine (STRATTERA) 25 MG capsule Take 25 mg by mouth 2 (two) times daily.     celecoxib  (CELEBREX ) 200 MG capsule Take 200 mg by mouth as needed.     clonazePAM  (KLONOPIN ) 0.5 MG tablet Take 0.5 mg by mouth 2 (two) times daily as needed for anxiety.     clonazePAM  (KLONOPIN ) 1 MG tablet Take 1 tablet (1 mg total) by mouth daily for severe panic attacks for anxiety. 30 tablet 2   Cyanocobalamin  (VITAMIN B-12) 5000 MCG TBDP Take 5,000 mcg by mouth daily.     desvenlafaxine  (PRISTIQ ) 100 MG 24 hr tablet Take 100 mg by mouth at bedtime.      desvenlafaxine  (PRISTIQ ) 100 MG  24 hr tablet Take 1 tablet (100 mg total) by mouth daily for anxiety or depression. 90 tablet 0   desvenlafaxine  (PRISTIQ ) 100 MG 24 hr tablet Take 1 tablet(s) by mouth daily for anxiety/depression 90 tablet 0   desvenlafaxine  (PRISTIQ ) 50 MG 24 hr tablet Take 1 tablet(s) by mouth daily for anxiety/depression 90 tablet 0   Docusate Calcium (STOOL SOFTENER PO) Take 1 capsule by mouth daily. *takes stool softener daily, takes additional docusate as needed     doxycycline (VIBRAMYCIN) 100 MG capsule Take 100 mg by mouth 2 (two) times daily.     hydrOXYzine  (ATARAX ) 10 MG tablet Take 10 mg by mouth 3 (three) times daily as needed.     lisdexamfetamine (VYVANSE ) 30 MG capsule Take 1 capsule(s) by mouth every morning for ADHD 30 capsule 0   lisdexamfetamine (VYVANSE ) 30 MG capsule Take 1 capsule(s) by mouth every morning for ADHD 30 capsule 0   lisdexamfetamine (VYVANSE ) 30 MG capsule Take 1 capsule (30 mg total) by mouth in the morning. 30 capsule 0   lisdexamfetamine (VYVANSE ) 40 MG capsule Take 1 capsule (40 mg total) by mouth every morning. (Patient not taking: Reported on 10/20/2022) 30 capsule 0   metaxalone (SKELAXIN) 800 MG tablet Take 1 tablet by mouth 3 (three) times daily as needed.     methylcellulose (CITRUCEL) oral powder Take 1 packet by mouth daily. (Patient not taking: Reported on 10/20/2022)     naproxen  (NAPROSYN ) 500 MG tablet Take 1 tablet (500 mg total) by mouth 2 (two) times daily. (Patient not taking: Reported on 10/20/2022) 30 tablet 0   Omega-3 Fatty Acids (FISH OIL PO) Take 1 capsule by mouth daily.     ondansetron  (ZOFRAN ) 8 MG tablet Take 1 tablet (8 mg total) by mouth daily as needed for nausea. 20 tablet 0   oxyCODONE  (ROXICODONE ) 5 MG immediate release tablet Take 1 tablet (5 mg total) by mouth every 4 (four) hours as needed for severe pain. (Patient not taking: Reported on 10/20/2022) 15 tablet 0   pantoprazole  (PROTONIX ) 40 MG tablet TAKE 1 TABLET(40 MG) BY MOUTH TWICE  DAILY BEFORE A MEAL 60 tablet 11   traZODone  (DESYREL ) 100 MG tablet Take 200 mg by mouth at bedtime.      traZODone  (DESYREL ) 100 MG tablet Take 2 tablet(s) by mouth at bedtime as needed for sleep 180 tablet 0   VITAMIN D PO Take 3 capsules by mouth daily.     No current facility-administered medications for this visit.    Allergies as of 08/30/2024 - Review Complete 10/20/2022  Allergen Reaction Noted   Atrovent [ipratropium] Other (See Comments) 07/27/2012   Morphine  and codeine Itching and Other (See Comments) 09/23/2020    Family History  Problem  Relation Age of Onset   Hypothyroidism Mother    Breast cancer Paternal Grandmother    Cancer Paternal Grandmother 66       breast   Arrhythmia Paternal Grandmother    Hyperlipidemia Paternal Grandmother    Angina Paternal Grandmother    Stroke Paternal Grandmother    Diabetes Son    Colon cancer Neg Hx    Colon polyps Neg Hx    Esophageal cancer Neg Hx    Stomach cancer Neg Hx    Rectal cancer Neg Hx    Inflammatory bowel disease Neg Hx    Liver disease Neg Hx    Pancreatic cancer Neg Hx     Social History   Tobacco Use   Smoking status: Former    Types: Cigarettes   Smokeless tobacco: Never   Tobacco comments:    Social smoker  Vaping Use   Vaping status: Never Used  Substance Use Topics   Alcohol use: Yes    Comment: Occasionally   Drug use: No     Review of Systems:    Constitutional: No weight loss, fever, chills, weakness or fatigue Eyes: No change in vision Ears, Nose, Throat:  No change in hearing or congestion Skin: No rash or itching Cardiovascular: No chest pain, chest pressure or palpitations   Respiratory: No SOB or cough Gastrointestinal: See HPI and otherwise negative Genitourinary: No dysuria or change in urinary frequency Neurological: No headache, dizziness or syncope Musculoskeletal: No new muscle or joint pain Hematologic: No bleeding or bruising    Physical Exam:  Vital signs: LMP  09/07/2020   Constitutional: NAD, Well developed, Well nourished, alert and cooperative Head:  Normocephalic and atraumatic.  Eyes: No scleral icterus. Conjunctiva pink. Mouth: No oral lesions. Respiratory: Respirations even and unlabored. Lungs clear to auscultation bilaterally.  No wheezes, crackles, or rhonchi.  Cardiovascular:  Regular rate and rhythm. No murmurs. No peripheral edema. Gastrointestinal:  Soft, nondistended, nontender. No rebound or guarding. Normal bowel sounds. No appreciable masses or hepatomegaly. Rectal:  Not performed.  Neurologic:  Alert and oriented x4;  grossly normal neurologically.  Skin:   Dry and intact without significant lesions or rashes. Psychiatric: Oriented to person, place and time. Demonstrates good judgement and reason without abnormal affect or behaviors.   RELEVANT LABS AND IMAGING: CBC    Component Value Date/Time   WBC 10.1 10/20/2022 1243   RBC 4.50 10/20/2022 1243   HGB 13.7 10/20/2022 1243   HCT 41.2 10/20/2022 1243   PLT 320.0 10/20/2022 1243   MCV 91.7 10/20/2022 1243   MCV 93.2 10/11/2013 1938   MCH 31.5 06/24/2022 0110   MCHC 33.3 10/20/2022 1243   RDW 12.2 10/20/2022 1243   LYMPHSABS 1.6 10/20/2022 1243   MONOABS 0.5 10/20/2022 1243   EOSABS 0.1 10/20/2022 1243   BASOSABS 0.0 10/20/2022 1243    CMP     Component Value Date/Time   NA 138 10/20/2022 1243   K 4.1 10/20/2022 1243   CL 104 10/20/2022 1243   CO2 29 10/20/2022 1243   GLUCOSE 96 10/20/2022 1243   BUN 8 10/20/2022 1243   CREATININE 0.61 10/20/2022 1243   CALCIUM 9.2 10/20/2022 1243   PROT 7.3 10/20/2022 1243   ALBUMIN 4.4 10/20/2022 1243   AST 17 10/20/2022 1243   ALT 20 10/20/2022 1243   ALKPHOS 49 10/20/2022 1243   BILITOT 0.3 10/20/2022 1243   GFRNONAA >60 06/24/2022 0110   GFRAA >60 07/10/2020 1727   Echocardiogram 07/10/2020 1. Left ventricular  ejection fraction, by estimation, is 60 to 65% . The left ventricle has normal function. The left  ventricle has no regional wall motion abnormalities. Left ventricular diastolic parameters were normal. 2. Right ventricular systolic function is normal. The right ventricular size is normal. Tricuspid regurgitation signal is inadequate for assessing PA pressure.  3. The mitral valve is normal in structure. Trivial mitral valve regurgitation. No evidence of mitral stenosis.  4. The aortic valve is normal in structure. Aortic valve regurgitation is not visualized. No aortic stenosis is present.  5. The inferior vena cava is normal in size with greater than 50% respiratory variability, suggesting right atrial pressure of 3 mmHg.  Assessment/Plan:    Assessment and Plan Assessment & Plan        Camie Furbish, PA-C [facility] 08/29/2024, 5:03 PM  Patient Care Team: Pcp, No as PCP - General Jeffrie Oneil BROCKS, MD as PCP - Cardiology (Cardiology) Celestia Agent, MD (Inactive) as Consulting Physician (Gastroenterology) Darcel Pool, MD as Consulting Physician (Obstetrics and Gynecology) Ethyl Lenis, MD as Consulting Physician (General Surgery)

## 2024-08-30 ENCOUNTER — Encounter: Payer: Self-pay | Admitting: Gastroenterology

## 2024-08-30 ENCOUNTER — Ambulatory Visit: Admitting: Gastroenterology

## 2024-08-30 VITALS — BP 118/72 | HR 95 | Ht 66.0 in | Wt 138.5 lb

## 2024-08-30 DIAGNOSIS — K649 Unspecified hemorrhoids: Secondary | ICD-10-CM

## 2024-08-30 DIAGNOSIS — K5909 Other constipation: Secondary | ICD-10-CM | POA: Diagnosis not present

## 2024-08-30 DIAGNOSIS — R1319 Other dysphagia: Secondary | ICD-10-CM | POA: Diagnosis not present

## 2024-08-30 DIAGNOSIS — R1013 Epigastric pain: Secondary | ICD-10-CM | POA: Diagnosis not present

## 2024-08-30 DIAGNOSIS — K219 Gastro-esophageal reflux disease without esophagitis: Secondary | ICD-10-CM

## 2024-08-30 DIAGNOSIS — R1030 Lower abdominal pain, unspecified: Secondary | ICD-10-CM

## 2024-08-30 MED ORDER — DEXLANSOPRAZOLE 60 MG PO CPDR: 60.0000 mg | DELAYED_RELEASE_CAPSULE | Freq: Every day | ORAL | 3 refills | Status: AC

## 2024-08-30 NOTE — Progress Notes (Signed)
 Attending Physician's Attestation   I have reviewed the chart.   I agree with the Advanced Practitioner's note, impression, and recommendations with any updates as below. Updated endoscopy is reasonable with her persisting symptoms.  I think that with her having had improvement with Voquenza, it makes sense that since we are having difficulty obtaining this that we consider as long as nothing else is significantly changed whether this patient is a candidate for test versus hiatal hernia plus fundoplication repair.  We will see what the updated endoscopy shows.  Agree with lower symptoms and the potential need for pelvic floor physical therapy, consider just going ahead and ordering/scheduling that for her if she agrees.   Aloha Finner, MD [facility] Advanced Endoscopy Office # [phone]

## 2024-08-30 NOTE — Patient Instructions (Addendum)
 Stop Pantoprazole .   Start Dexilant  60 mg once daily.   We have sent the following medications to your pharmacy for you to pick up at your convenience: Dexilant    We have given you samples of the following medication to take: Linzess 145 mcg - 1 pill once daily. If effective then call office and request prescription be sent to pharmacy.   Continue Fiber supplements and good water intake.   You have been scheduled for an endoscopy. Please follow written instructions given to you at your visit today.  If you use inhalers (even only as needed), please bring them with you on the day of your procedure.  If you take any of the following medications, they will need to be adjusted prior to your procedure:   DO NOT TAKE 7 DAYS PRIOR TO TEST- Trulicity (dulaglutide) Ozempic, Wegovy (semaglutide) Mounjaro, Zepbound (tirzepatide) Bydureon Bcise (exanatide extended release)  DO NOT TAKE 1 DAY PRIOR TO YOUR TEST Rybelsus (semaglutide) Adlyxin (lixisenatide) Victoza (liraglutide) Byetta (exanatide) ____________________________________________________________   _______________________________________________________  If your blood pressure at your visit was 140/90 or greater, please contact your primary care physician to follow up on this.  _______________________________________________________  If you are age 46 or older, your body mass index should be between 23-30. Your Body mass index is 22.35 kg/m. If this is out of the aforementioned range listed, please consider follow up with your Primary Care Provider.  If you are age 69 or younger, your body mass index should be between 19-25. Your Body mass index is 22.35 kg/m. If this is out of the aformentioned range listed, please consider follow up with your Primary Care Provider.   ________________________________________________________  The [facility] providers would like to encourage you to use MYCHART to communicate with providers  for non-urgent requests or questions.  Due to long hold times on the telephone, sending your provider a message by [facility] may be a faster and more efficient way to get a response.  Please allow 48 business hours for a response.  Please remember that this is for non-urgent requests.  _______________________________________________________  [facility] is using a team-based approach to care.  Your team is made up of your doctor and two to three APPS. Our APPS (Nurse Practitioners and Physician Assistants) work with your physician to ensure care continuity for you. They are fully qualified to address your health concerns and develop a treatment plan. They communicate directly with your gastroenterologist to care for you. Seeing the Advanced Practice Practitioners on your physician's team can help you by facilitating care more promptly, often allowing for earlier appointments, access to diagnostic testing, procedures, and other specialty referrals.   Thank you for choosing me and [facility].  Camie Furbish, PA-C

## 2024-08-31 ENCOUNTER — Other Ambulatory Visit (HOSPITAL_COMMUNITY): Payer: Self-pay

## 2024-08-31 ENCOUNTER — Telehealth: Payer: Self-pay

## 2024-08-31 NOTE — Telephone Encounter (Signed)
 Pharmacy Patient Advocate Encounter    Per test claim: PA required; PA submitted to above mentioned insurance via Latent Key/confirmation #/EOC [facility] Status is pending

## 2024-08-31 NOTE — Telephone Encounter (Signed)
 Pharmacy Patient Advocate Encounter   Received notification from CoverMyMeds that prior authorization for Dexlansoprazole  60MG  dr capsules is required/requested.   Insurance verification completed.   The patient is insured through [employer].   Per test claim: PA required; PA started via CoverMyMeds. KEY [id number] . Waiting for clinical questions to populate.

## 2024-09-03 ENCOUNTER — Other Ambulatory Visit (HOSPITAL_COMMUNITY): Payer: Self-pay

## 2024-09-03 NOTE — Telephone Encounter (Signed)
 Pharmacy Patient Advocate Encounter  Received notification from [facility] that Prior Authorization for Dexlansoprazole  60MG  dr capsules has been APPROVED from 08-31-2024 to 08-31-2025. Ran test claim, Copay is $45.00 for 90 day supply. This test claim was processed through [facility]- copay amounts may vary at other pharmacies due to pharmacy/plan contracts, or as the patient moves through the different stages of their insurance plan.   PA #/Case ID/Reference #: [id number]

## 2024-09-05 ENCOUNTER — Encounter: Payer: Self-pay | Admitting: Gastroenterology

## 2024-09-10 ENCOUNTER — Telehealth: Payer: Self-pay | Admitting: Gastroenterology

## 2024-09-10 NOTE — Telephone Encounter (Signed)
 Sorry she is unwell. I agree with this and with underlying possible lung/respiratory issue, ideally would allow some time to heal (sometimes up to 4 weeks or more) before non-emergent endoscopic evaluations. Thanks. GM

## 2024-09-10 NOTE — Telephone Encounter (Signed)
 Good morning Dr. Wilhelmenia,   I received a call from this patient stating that she is Sick and possible might have pneumonia or Bronchitis. Patient stated that she was able to get an appointment with her PCP for Tomorrow. Patient was schedule for an EGD on November the 19 th and was reschedule for January the 6 th. Please advise.    Thank you.

## 2024-09-12 ENCOUNTER — Encounter: Admitting: Gastroenterology

## 2024-09-21 ENCOUNTER — Emergency Department (HOSPITAL_BASED_OUTPATIENT_CLINIC_OR_DEPARTMENT_OTHER): Admitting: Radiology

## 2024-09-21 ENCOUNTER — Emergency Department (HOSPITAL_BASED_OUTPATIENT_CLINIC_OR_DEPARTMENT_OTHER)
Admission: EM | Admit: 2024-09-21 | Discharge: 2024-09-22 | Disposition: A | Attending: Emergency Medicine | Admitting: Emergency Medicine

## 2024-09-21 ENCOUNTER — Other Ambulatory Visit: Payer: Self-pay

## 2024-09-21 DIAGNOSIS — M542 Cervicalgia: Secondary | ICD-10-CM | POA: Diagnosis not present

## 2024-09-21 DIAGNOSIS — R059 Cough, unspecified: Secondary | ICD-10-CM | POA: Diagnosis present

## 2024-09-21 DIAGNOSIS — D72829 Elevated white blood cell count, unspecified: Secondary | ICD-10-CM | POA: Diagnosis not present

## 2024-09-21 DIAGNOSIS — R030 Elevated blood-pressure reading, without diagnosis of hypertension: Secondary | ICD-10-CM | POA: Diagnosis not present

## 2024-09-21 DIAGNOSIS — R079 Chest pain, unspecified: Secondary | ICD-10-CM | POA: Diagnosis not present

## 2024-09-21 DIAGNOSIS — K5900 Constipation, unspecified: Secondary | ICD-10-CM | POA: Diagnosis not present

## 2024-09-21 DIAGNOSIS — R109 Unspecified abdominal pain: Secondary | ICD-10-CM | POA: Diagnosis not present

## 2024-09-21 DIAGNOSIS — K219 Gastro-esophageal reflux disease without esophagitis: Secondary | ICD-10-CM | POA: Diagnosis not present

## 2024-09-21 DIAGNOSIS — R410 Disorientation, unspecified: Secondary | ICD-10-CM | POA: Diagnosis not present

## 2024-09-21 DIAGNOSIS — J40 Bronchitis, not specified as acute or chronic: Secondary | ICD-10-CM | POA: Diagnosis not present

## 2024-09-21 DIAGNOSIS — R42 Dizziness and giddiness: Secondary | ICD-10-CM | POA: Insufficient documentation

## 2024-09-21 LAB — COMPREHENSIVE METABOLIC PANEL WITH GFR
ALT: 14 U/L (ref 0–44)
AST: 22 U/L (ref 15–41)
Albumin: 4.3 g/dL (ref 3.5–5.0)
Alkaline Phosphatase: 64 U/L (ref 38–126)
Anion gap: 14 (ref 5–15)
BUN: 12 mg/dL (ref 6–20)
CO2: 21 mmol/L — ABNORMAL LOW (ref 22–32)
Calcium: 9.4 mg/dL (ref 8.9–10.3)
Chloride: 104 mmol/L (ref 98–111)
Creatinine, Ser: 0.54 mg/dL (ref 0.44–1.00)
GFR, Estimated: >60 mL/min (ref >60)
Glucose, Bld: 87 mg/dL (ref 70–99)
Potassium: 3.7 mmol/L (ref 3.5–5.1)
Sodium: 139 mmol/L (ref 135–145)
Total Bilirubin: 0.3 mg/dL (ref 0.0–1.2)
Total Protein: 7.6 g/dL (ref 6.5–8.1)

## 2024-09-21 LAB — CBC
HCT: 41.7 % (ref 36.0–46.0)
Hemoglobin: 14.2 g/dL (ref 12.0–15.0)
MCH: 30.9 pg (ref 26.0–34.0)
MCHC: 34.1 g/dL (ref 30.0–36.0)
MCV: 90.7 fL (ref 80.0–100.0)
Platelets: 300 K/uL (ref 150–400)
RBC: 4.6 MIL/uL (ref 3.87–5.11)
RDW: 12.7 % (ref 11.5–15.5)
WBC: 11.5 K/uL — ABNORMAL HIGH (ref 4.0–10.5)
nRBC: 0 % (ref 0.0–0.2)

## 2024-09-21 LAB — TROPONIN T, HIGH SENSITIVITY
Troponin T High Sensitivity: <15 ng/L (ref 0–19)
Troponin T High Sensitivity: <15 ng/L (ref 0–19)

## 2024-09-21 LAB — URINALYSIS, ROUTINE W REFLEX MICROSCOPIC
Bacteria, UA: NONE SEEN
Bilirubin Urine: NEGATIVE
Glucose, UA: NEGATIVE mg/dL
Ketones, ur: NEGATIVE mg/dL
Leukocytes,Ua: NEGATIVE
Nitrite: NEGATIVE
Protein, ur: NEGATIVE mg/dL
Specific Gravity, Urine: 1.015 (ref 1.005–1.030)
pH: 6 (ref 5.0–8.0)

## 2024-09-21 LAB — LIPASE, BLOOD: Lipase: 27 U/L (ref 11–51)

## 2024-09-21 MED ORDER — SODIUM CHLORIDE 0.9 % IV BOLUS
1000.0000 mL | Freq: Once | INTRAVENOUS | Status: AC
  Administered 2024-09-22: 1000 mL via INTRAVENOUS

## 2024-09-21 NOTE — ED Provider Notes (Incomplete)
 [facility] AT [facility] Provider Note   CSN: [id number] Arrival date & time: 09/21/24  1847     Patient presents with: No chief complaint on file.   Lindsay Giles is a 48 y.o. female.  {Add pertinent medical, surgical, social history, OB history to HPI:32947} HPI     Prior to Admission medications   Medication Sig Start Date End Date Taking? Authorizing Provider  atomoxetine (STRATTERA) 25 MG capsule Take 25 mg by mouth 2 (two) times daily. Patient not taking: Reported on 08/30/2024    [provider]  baclofen (LIORESAL) 10 MG tablet Take 10 mg by mouth 3 (three) times daily.    [provider]  celecoxib  (CELEBREX ) 200 MG capsule Take 200 mg by mouth as needed. Patient not taking: Reported on 08/30/2024    [provider]  clonazePAM  (KLONOPIN ) 0.5 MG tablet Take 0.5 mg by mouth 2 (two) times daily as needed for anxiety. Patient not taking: Reported on 08/30/2024 06/12/20   [provider]  clonazePAM  (KLONOPIN ) 1 MG tablet Take 1 tablet (1 mg total) by mouth daily for severe panic attacks for anxiety. Patient taking differently: Take 1 mg by mouth as needed. 02/23/23     Cranberry 130 MG CAPS Take by mouth.    [provider]  Cyanocobalamin  (VITAMIN B-12) 5000 MCG TBDP Take 5,000 mcg by mouth daily. Patient not taking: Reported on 08/30/2024    [provider]  desvenlafaxine  (PRISTIQ ) 100 MG 24 hr tablet Take 100 mg by mouth at bedtime.     [provider]  desvenlafaxine  (PRISTIQ ) 100 MG 24 hr tablet Take 1 tablet (100 mg total) by mouth daily for anxiety or depression. 02/23/23     desvenlafaxine  (PRISTIQ ) 100 MG 24 hr tablet Take 1 tablet(s) by mouth daily for anxiety/depression 02/23/23     desvenlafaxine  (PRISTIQ ) 50 MG 24 hr tablet Take 1 tablet(s) by mouth daily for anxiety/depression 02/23/23     dexlansoprazole  (DEXILANT ) 60 MG capsule Take 1 capsule (60 mg total) by mouth daily.  08/30/24   Heinz, Sara E, PA-C  Docusate Calcium (STOOL SOFTENER PO) Take 1 capsule by mouth daily. *takes stool softener daily, takes additional docusate as needed    [provider]  doxycycline (VIBRAMYCIN) 100 MG capsule Take 100 mg by mouth 2 (two) times daily. Patient not taking: Reported on 08/30/2024    [provider]  hydrOXYzine  (ATARAX ) 10 MG tablet Take 10 mg by mouth 3 (three) times daily as needed. Patient not taking: Reported on 08/30/2024 10/15/22   [provider]  lactobacillus acidophilus (BACID) TABS tablet Take 2 tablets by mouth 3 (three) times daily.    [provider]  lisdexamfetamine (VYVANSE ) 30 MG capsule Take 1 capsule(s) by mouth every morning for ADHD 04/27/23     lisdexamfetamine (VYVANSE ) 30 MG capsule Take 1 capsule(s) by mouth every morning for ADHD Patient not taking: Reported on 08/30/2024 03/28/23     lisdexamfetamine (VYVANSE ) 30 MG capsule Take 1 capsule (30 mg total) by mouth in the morning. Patient not taking: Reported on 08/30/2024 08/03/23     lisdexamfetamine (VYVANSE ) 40 MG capsule Take 1 capsule (40 mg total) by mouth every morning. 07/14/20   Cottle, Carey G Jr., MD  metaxalone (SKELAXIN) 800 MG tablet Take 1 tablet by mouth 3 (three) times daily as needed. 08/19/22   [provider]  methylcellulose (CITRUCEL) oral powder Take 1 packet by mouth daily. Patient not taking: Reported on 08/30/2024  11/10/21   Teressa Toribio SQUIBB, MD  naproxen  (NAPROSYN ) 500 MG tablet Take 1 tablet (500 mg total) by mouth 2 (two) times daily. Patient not taking: Reported on 08/30/2024 06/24/22   Raford Lenis, MD  Omega-3 Fatty Acids (FISH OIL PO) Take 1 capsule by mouth daily.    [provider]  ondansetron  (ZOFRAN ) 8 MG tablet Take 1 tablet (8 mg total) by mouth daily as needed for nausea. 07/23/20   Gladis Elsie BROCKS, PA-C  oxyCODONE  (ROXICODONE ) 5 MG immediate release tablet Take 1 tablet (5 mg total) by mouth every 4 (four)  hours as needed for severe pain. Patient not taking: Reported on 08/30/2024 06/24/22   Raford Lenis, MD  traZODone  (DESYREL ) 100 MG tablet Take 200 mg by mouth at bedtime.     [provider]  traZODone  (DESYREL ) 100 MG tablet Take 2 tablet(s) by mouth at bedtime as needed for sleep 02/23/23     Turmeric 02-999 MG CAPS     [provider]  VITAMIN D PO Take 3 capsules by mouth daily.    [provider]    Allergies: Atrovent [ipratropium] and Morphine  and codeine    Review of Systems  Updated Vital Signs BP 114/69   Pulse (!) 55   Temp 98.1 F (36.7 C) (Oral)   Resp 18   LMP 09/07/2020   SpO2 98%   Physical Exam  (all labs ordered are listed, but only abnormal results are displayed) Labs Reviewed  CBC - Abnormal; Notable for the following components:      Result Value   WBC 11.5 (*)    All other components within normal limits  COMPREHENSIVE METABOLIC PANEL WITH GFR - Abnormal; Notable for the following components:   CO2 21 (*)    All other components within normal limits  URINALYSIS, ROUTINE W REFLEX MICROSCOPIC - Abnormal; Notable for the following components:   Hgb urine dipstick TRACE (*)    All other components within normal limits  LIPASE, BLOOD  TROPONIN T, HIGH SENSITIVITY  TROPONIN T, HIGH SENSITIVITY    EKG: None  Radiology: DG Chest 2 View Result Date: 09/21/2024 EXAM: 2 VIEW(S) XRAY OF THE CHEST 09/21/2024 07:22:00 PM COMPARISON: 9/ 16 / 21. CLINICAL HISTORY: chest pain FINDINGS: LUNGS AND PLEURA: No focal pulmonary opacity. No pleural effusion. No pneumothorax. HEART AND MEDIASTINUM: No acute abnormality of the cardiac and mediastinal silhouettes. BONES AND SOFT TISSUES: No acute osseous abnormality. IMPRESSION: 1. No acute cardiopulmonary process. Electronically signed by: Norman Gatlin MD 09/21/2024 07:24 PM EST RP Workstation: [id number]    {Document cardiac monitor, telemetry assessment procedure when  appropriate:32947} Procedures   Medications Ordered in the ED - No data to display    {Click here for ABCD2, HEART and other calculators REFRESH Note before signing:1}                              Medical Decision Making Amount and/or Complexity of Data Reviewed Labs: ordered. Radiology: ordered.   ***  {Document critical care time when appropriate  Document review of labs and clinical decision tools ie CHADS2VASC2, etc  Document your independent review of radiology images and any outside records  Document your discussion with family members, caretakers and with consultants  Document social determinants of health affecting pt's care  Document your decision making why or why not admission, treatments were needed:32947:::1}   Final diagnoses:  None    ED Discharge Orders  None

## 2024-09-21 NOTE — ED Triage Notes (Signed)
 Last night started feeling stabbing abd pain. Today dizziness, confusion, pain in neck, cp.

## 2024-09-22 ENCOUNTER — Emergency Department (HOSPITAL_BASED_OUTPATIENT_CLINIC_OR_DEPARTMENT_OTHER)

## 2024-09-22 LAB — MONONUCLEOSIS SCREEN: Mono Screen: NEGATIVE

## 2024-09-22 LAB — GROUP A STREP BY PCR: Group A Strep by PCR: NOT DETECTED

## 2024-09-22 MED ORDER — ALBUTEROL SULFATE HFA 108 (90 BASE) MCG/ACT IN AERS
2.0000 | INHALATION_SPRAY | Freq: Once | RESPIRATORY_TRACT | Status: AC
  Administered 2024-09-22: 2 via RESPIRATORY_TRACT
  Filled 2024-09-22: qty 6.7

## 2024-09-22 MED ORDER — BENZONATATE 100 MG PO CAPS: 100.0000 mg | ORAL_CAPSULE | Freq: Three times a day (TID) | ORAL | 0 refills | Status: DC

## 2024-09-22 MED ORDER — AZITHROMYCIN 250 MG PO TABS: 250.0000 mg | ORAL_TABLET | Freq: Every day | ORAL | 0 refills | Status: DC

## 2024-09-22 MED ORDER — KETOROLAC TROMETHAMINE 15 MG/ML IJ SOLN
15.0000 mg | Freq: Once | INTRAMUSCULAR | Status: AC
  Administered 2024-09-22: 15 mg via INTRAVENOUS
  Filled 2024-09-22: qty 1

## 2024-09-22 MED ORDER — PREDNISONE 10 MG PO TABS: 40.0000 mg | ORAL_TABLET | Freq: Every day | ORAL | 0 refills | Status: AC

## 2024-09-22 MED ORDER — IOHEXOL 350 MG/ML SOLN
100.0000 mL | Freq: Once | INTRAVENOUS | Status: AC | PRN
  Administered 2024-09-22: 100 mL via INTRAVENOUS

## 2024-09-22 NOTE — Discharge Instructions (Addendum)
 Your CT imaging of the abdomen and pelvis was overall reassuring did not show any acute cause of your pain.  It did show a chronic appearing cyst in your pelvis which was present on previous CT imaging and may reflect a right ovarian cyst which would not explain your discomfort.  Your pain has since resolved.  Your symptoms could have been due to constipation may have resolved with your most recent bowel movement.  Your CT of the chest showed evidence of pulmonary nodules, did not show consolidation to suggest bacterial pneumonia.  Given the duration of symptoms, we will treat for bronchitis.  Follow-up with your PCP for outpatient surveillance regarding the finding of nodule seen on your chest CT. Your Neck CT was also reassuring.

## 2024-10-04 ENCOUNTER — Telehealth: Payer: Self-pay | Admitting: Gastroenterology

## 2024-10-04 NOTE — Telephone Encounter (Signed)
 PT stated the her GERD is making her BP rise. Requesting to speak with a nurse about her symptoms.

## 2024-10-05 NOTE — Telephone Encounter (Signed)
 Attempted to contact patient. LVM

## 2024-10-05 NOTE — Telephone Encounter (Signed)
 Pt returned call from [facility]. Pt requsting call back. Please advise thank you.

## 2024-10-08 NOTE — Telephone Encounter (Signed)
 Left message on machine to call back

## 2024-10-09 NOTE — Telephone Encounter (Signed)
Left message on machine to call back   Unable to reach pt will await further communication from the pt  

## 2024-10-30 ENCOUNTER — Encounter: Payer: Self-pay | Admitting: Gastroenterology

## 2024-10-30 ENCOUNTER — Ambulatory Visit: Admitting: Gastroenterology

## 2024-10-30 VITALS — BP 122/80 | HR 70 | Temp 97.9°F | Resp 14 | Ht 66.0 in | Wt 138.0 lb

## 2024-10-30 DIAGNOSIS — R109 Unspecified abdominal pain: Secondary | ICD-10-CM

## 2024-10-30 DIAGNOSIS — M1990 Unspecified osteoarthritis, unspecified site: Secondary | ICD-10-CM | POA: Insufficient documentation

## 2024-10-30 DIAGNOSIS — K449 Diaphragmatic hernia without obstruction or gangrene: Secondary | ICD-10-CM | POA: Diagnosis not present

## 2024-10-30 DIAGNOSIS — K2950 Unspecified chronic gastritis without bleeding: Secondary | ICD-10-CM | POA: Diagnosis not present

## 2024-10-30 DIAGNOSIS — K2970 Gastritis, unspecified, without bleeding: Secondary | ICD-10-CM | POA: Diagnosis present

## 2024-10-30 DIAGNOSIS — R1319 Other dysphagia: Secondary | ICD-10-CM

## 2024-10-30 DIAGNOSIS — M359 Systemic involvement of connective tissue, unspecified: Secondary | ICD-10-CM | POA: Insufficient documentation

## 2024-10-30 DIAGNOSIS — K219 Gastro-esophageal reflux disease without esophagitis: Secondary | ICD-10-CM

## 2024-10-30 DIAGNOSIS — M3500 Sicca syndrome, unspecified: Secondary | ICD-10-CM | POA: Insufficient documentation

## 2024-10-30 MED ORDER — SODIUM CHLORIDE 0.9 % IV SOLN: 500.0000 mL | Freq: Once | INTRAVENOUS | Status: DC

## 2024-10-30 MED ORDER — VOQUEZNA 10 MG PO TABS: 10.0000 mg | ORAL_TABLET | Freq: Every day | ORAL | 3 refills | Status: AC

## 2024-10-30 MED ORDER — ONDANSETRON HCL 4 MG PO TABS: 4.0000 mg | ORAL_TABLET | Freq: Three times a day (TID) | ORAL | 1 refills | Status: AC | PRN

## 2024-10-30 NOTE — Telephone Encounter (Signed)
-----   Message from Aloha Finner, MD sent at 10/30/2024  4:14 PM EST ----- Regarding: RUQUS needed Lindsay Giles, Patient needs RUQUS for further workup of her pain/discomfort. Thanks. GM

## 2024-10-30 NOTE — Progress Notes (Signed)
 "  GASTROENTEROLOGY PROCEDURE H&P NOTE   Primary Care Physician: Pcp, No  HPI: Lindsay Giles is a 49 y.o. female who presents for EGD for evaluation of GERD symptoms.  Past Medical History:  Diagnosis Date   Abnormal Pap smear    ADD (attention deficit disorder)    Anemia    Anxiety and depression    Complication of anesthesia    Dysrhythmia    Gastritis    Per patient   GERD (gastroesophageal reflux disease)    Menorrhagia    Pneumonia    PONV (postoperative nausea and vomiting)    Rapid heart rate    Tachycardia    Past Surgical History:  Procedure Laterality Date   BILATERAL SALPINGECTOMY Bilateral 10/13/2020   Procedure: BILATERAL SALPINGECTOMY;  Surgeon: Darcel Pool, MD;  Location: [facility];  Service: Gynecology;  Laterality: Bilateral;  VAGINAL   BREAST REDUCTION SURGERY     CERVICAL ABLATION     pt denies having had d&c   DILATION AND CURETTAGE OF UTERUS     pt stateds did not have d & c , but rather a carvical ablation   ESOPHAGEAL DILATION  08/2020   EXAMINATION UNDER ANESTHESIA N/A 02/17/2014   Procedure: EXAM UNDER ANESTHESIA,  ULTRASOUND OF RECTUM, INCISION AND EXCISION OF THROMBOSED CLOT ;  Surgeon: Alm VEAR Angle, MD;  Location: [facility];  Service: General;  Laterality: N/A;   KNEE SURGERY     right hand surgery     neuroma & groth on bone removed    TONSILLECTOMY AND ADENOIDECTOMY     VAGINAL HYSTERECTOMY N/A 10/13/2020   Procedure: HYSTERECTOMY VAGINAL;  Surgeon: Darcel Pool, MD;  Location:  SUR[facility]ERY [facility];  Service: Gynecology;  Laterality: N/A;   Current Outpatient Medications  Medication Sig Dispense Refill   albuterol  (VENTOLIN  HFA) 108 (90 Base) MCG/ACT inhaler INHALE 2 PUFFS INTO THE LUNGS EVERY 6 HOURS AS NEEDED FOR WHEEZING     baclofen (LIORESAL) 10 MG tablet Take 10 mg by mouth 3 (three) times daily.     clonazePAM  (KLONOPIN ) 1 MG tablet Take 1 tablet (1 mg total) by mouth daily for severe panic attacks  for anxiety. (Patient taking differently: Take 1 mg by mouth as needed.) 30 tablet 2   Cranberry 130 MG CAPS Take by mouth.     desvenlafaxine  (PRISTIQ ) 100 MG 24 hr tablet Take 100 mg by mouth at bedtime.      dexlansoprazole  (DEXILANT ) 60 MG capsule Take 1 capsule (60 mg total) by mouth daily. 90 capsule 3   lactobacillus acidophilus (BACID) TABS tablet Take 2 tablets by mouth 3 (three) times daily.     lisdexamfetamine  (VYVANSE ) 30 MG capsule Take 1 capsule(s) by mouth every morning for ADHD 30 capsule 0   Omega-3 Fatty Acids (FISH OIL PO) Take 1 capsule by mouth daily.     pregabalin (LYRICA) 150 MG capsule Take 150 mg by mouth 2 (two) times daily.     traZODone  (DESYREL ) 100 MG tablet Take 200 mg by mouth at bedtime.      Turmeric 02-999 MG CAPS      VITAMIN D PO Take 3 capsules by mouth daily.     amoxicillin-clavulanate (AUGMENTIN) 875-125 MG tablet Take 1 tablet by mouth 2 (two) times daily. (Patient not taking: Reported on 10/30/2024)     atomoxetine (STRATTERA) 25 MG capsule Take 25 mg by mouth 2 (two) times daily. (Patient not taking: Reported on 10/30/2024)     azithromycin  (ZITHROMAX )  250 MG tablet Take 1 tablet (250 mg total) by mouth daily. Take first 2 tablets together, then 1 every day until finished. (Patient not taking: Reported on 10/30/2024) 6 tablet 0   benzonatate  (TESSALON ) 100 MG capsule Take 1 capsule (100 mg total) by mouth every 8 (eight) hours. (Patient not taking: Reported on 10/30/2024) 21 capsule 0   celecoxib  (CELEBREX ) 200 MG capsule Take 200 mg by mouth as needed. (Patient not taking: Reported on 08/30/2024)     clonazePAM  (KLONOPIN ) 0.5 MG tablet Take 0.5 mg by mouth 2 (two) times daily as needed for anxiety. (Patient not taking: Reported on 08/30/2024)     Cyanocobalamin  (VITAMIN B-12) 5000 MCG TBDP Take 5,000 mcg by mouth daily. (Patient not taking: Reported on 08/30/2024)     desvenlafaxine  (PRISTIQ ) 100 MG 24 hr tablet Take 1 tablet (100 mg total) by mouth daily for  anxiety or depression. 90 tablet 0   desvenlafaxine  (PRISTIQ ) 100 MG 24 hr tablet Take 1 tablet(s) by mouth daily for anxiety/depression 90 tablet 0   desvenlafaxine  (PRISTIQ ) 50 MG 24 hr tablet Take 1 tablet(s) by mouth daily for anxiety/depression 90 tablet 0   Docusate Calcium (STOOL SOFTENER PO) Take 1 capsule by mouth daily. *takes stool softener daily, takes additional docusate as needed     doxycycline (VIBRAMYCIN) 100 MG capsule Take 100 mg by mouth 2 (two) times daily. (Patient not taking: Reported on 08/30/2024)     hydrOXYzine  (ATARAX ) 10 MG tablet Take 10 mg by mouth 3 (three) times daily as needed. (Patient not taking: Reported on 08/30/2024)     lisdexamfetamine  (VYVANSE ) 30 MG capsule Take 1 capsule(s) by mouth every morning for ADHD (Patient not taking: Reported on 08/30/2024) 30 capsule 0   lisdexamfetamine  (VYVANSE ) 30 MG capsule Take 1 capsule (30 mg total) by mouth in the morning. (Patient not taking: Reported on 08/30/2024) 30 capsule 0   lisdexamfetamine  (VYVANSE ) 40 MG capsule Take 1 capsule (40 mg total) by mouth every morning. 30 capsule 0   metaxalone (SKELAXIN) 800 MG tablet Take 1 tablet by mouth 3 (three) times daily as needed.     methylcellulose (CITRUCEL) oral powder Take 1 packet by mouth daily. (Patient not taking: Reported on 08/30/2024)     naproxen  (NAPROSYN ) 500 MG tablet Take 1 tablet (500 mg total) by mouth 2 (two) times daily. (Patient not taking: Reported on 08/30/2024) 30 tablet 0   ondansetron  (ZOFRAN ) 8 MG tablet Take 1 tablet (8 mg total) by mouth daily as needed for nausea. 20 tablet 0   ondansetron  (ZOFRAN -ODT) 8 MG disintegrating tablet Take 8 mg by mouth every 8 (eight) hours as needed.     oxyCODONE  (ROXICODONE ) 5 MG immediate release tablet Take 1 tablet (5 mg total) by mouth every 4 (four) hours as needed for severe pain. (Patient not taking: Reported on 08/30/2024) 15 tablet 0   pilocarpine (SALAGEN) 5 MG tablet Take by mouth.     traZODone  (DESYREL )  100 MG tablet Take 2 tablet(s) by mouth at bedtime as needed for sleep 180 tablet 0   Current Facility-Administered Medications  Medication Dose Route Frequency Provider Last Rate Last Admin   0.9 %  sodium chloride  infusion  500 mL Intravenous Once [name], Aloha Raddle., MD       Current Medications[1] Allergies[2] Family History  Problem Relation Age of Onset   Hypothyroidism Mother    Breast cancer Paternal Grandmother    Cancer Paternal Grandmother 53       breast  Arrhythmia Paternal Grandmother    Hyperlipidemia Paternal Grandmother    Angina Paternal Grandmother    Stroke Paternal Grandmother    Diabetes Son    Colon cancer Neg Hx    Colon polyps Neg Hx    Esophageal cancer Neg Hx    Stomach cancer Neg Hx    Rectal cancer Neg Hx    Inflammatory bowel disease Neg Hx    Liver disease Neg Hx    Pancreatic cancer Neg Hx    Social History   Socioeconomic History   Marital status: Married    Spouse name: Not on file   Number of children: Not on file   Years of education: Not on file   Highest education level: Not on file  Occupational History   Not on file  Tobacco Use   Smoking status: Former    Types: Cigarettes   Smokeless tobacco: Never   Tobacco comments:    Social smoker  Vaping Use   Vaping status: Never Used  Substance and Sexual Activity   Alcohol use: Yes    Comment: Occasionally   Drug use: No   Sexual activity: Yes    Partners: Male    Birth control/protection: Surgical    Comment: vas   Other Topics Concern   Not on file  Social History Narrative   Daughter of Dr. 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 [facility]   Social Drivers of Health   Tobacco Use: Medium Risk (08/30/2024)   Patient History    Smoking Tobacco Use: Former    Smokeless Tobacco Use: Never    Passive Exposure: Not on Actuary Strain: Low Risk (11/25/2023)   Received from [facility]   Overall Financial Resource  Strain (CARDIA)    Difficulty of Paying Living Expenses: Not hard at all  Food Insecurity: No Food Insecurity (11/25/2023)   Received from [facility]   Epic    Within the past 12 months, you worried that your food would run out before you got the money to buy more.: Never true    Within the past 12 months, the food you bought just didn't last and you didn't have money to get more.: Never true  Transportation Needs: No Transportation Needs (11/25/2023)   Received from [facility] - Transportation    Lack of Transportation (Medical): No    Lack of Transportation (Non-Medical): No  Physical Activity: Not on file  Stress: Not on file  Social Connections: Not on file  Intimate Partner Violence: Not on file  Depression (EYV7-0): Not on file  Alcohol Screen: Not on file  Housing: Low Risk (11/25/2023)   Received from [facility]   Epic    At any time in the past 12 months, were you homeless or living in a shelter (including now)?: No    In the past 12 months, how many times have you moved where you were living?: 0    In the last 12 months, was there a time when you were not able to pay the mortgage or rent on time?: No  Utilities: Not At Risk (11/25/2023)   Received from [facility] Utilities    Threatened with loss of utilities: No  Health Literacy: Not on file    Physical Exam: Today's Vitals   10/30/24 1515 10/30/24 1520  BP: 111/77   Pulse: 79   Temp: 97.9 F (36.6 C) 97.9 F (36.6 C)  SpO2: 100%  Weight: 138 lb (62.6 kg)   Height: 5' 6 (1.676 m)    Body mass index is 22.27 kg/m. GEN: NAD EYE: Sclerae anicteric ENT: MMM CV: Non-tachycardic GI: Soft, NT/ND NEURO:  Alert & Oriented x 3  Lab Results: No results for input(s): WBC, HGB, HCT, PLT in the last 72 hours. BMET No results for input(s): NA, K, CL, CO2, GLUCOSE, BUN, CREATININE, CALCIUM in the last 72 hours. LFT No results for input(s): PROT, ALBUMIN, AST,  ALT, ALKPHOS, BILITOT, BILIDIR, IBILI in the last 72 hours. PT/INR No results for input(s): LABPROT, INR in the last 72 hours.   Impression / Plan: This is a 49 y.o.female who presents for EGD for evaluation of GERD symptoms.  The risks and benefits of endoscopic evaluation/treatment were discussed with the patient and/or family; these include but are not limited to the risk of perforation, infection, bleeding, missed lesions, lack of diagnosis, severe illness requiring hospitalization, as well as anesthesia and sedation related illnesses.  The patient's history has been reviewed, patient examined, no change in status, and deemed stable for procedure.  The patient and/or family was provided an opportunity to ask questions and all were answered.  The patient and/or family is agreeable to proceed.    Aloha Finner, MD [facility] Office # [phone]    [1]  Current Outpatient Medications:    albuterol  (VENTOLIN  HFA) 108 (90 Base) MCG/ACT inhaler, INHALE 2 PUFFS INTO THE LUNGS EVERY 6 HOURS AS NEEDED FOR WHEEZING, Disp: , Rfl:    baclofen (LIORESAL) 10 MG tablet, Take 10 mg by mouth 3 (three) times daily., Disp: , Rfl:    clonazePAM  (KLONOPIN ) 1 MG tablet, Take 1 tablet (1 mg total) by mouth daily for severe panic attacks for anxiety. (Patient taking differently: Take 1 mg by mouth as needed.), Disp: 30 tablet, Rfl: 2   Cranberry 130 MG CAPS, Take by mouth., Disp: , Rfl:    desvenlafaxine  (PRISTIQ ) 100 MG 24 hr tablet, Take 100 mg by mouth at bedtime. , Disp: , Rfl:    dexlansoprazole  (DEXILANT ) 60 MG capsule, Take 1 capsule (60 mg total) by mouth daily., Disp: 90 capsule, Rfl: 3   lactobacillus acidophilus (BACID) TABS tablet, Take 2 tablets by mouth 3 (three) times daily., Disp: , Rfl:    lisdexamfetamine  (VYVANSE ) 30 MG capsule, Take 1 capsule(s) by mouth every morning for ADHD, Disp: 30 capsule, Rfl: 0   Omega-3 Fatty Acids (FISH OIL  PO), Take 1 capsule by mouth daily., Disp: , Rfl:    pregabalin (LYRICA) 150 MG capsule, Take 150 mg by mouth 2 (two) times daily., Disp: , Rfl:    traZODone  (DESYREL ) 100 MG tablet, Take 200 mg by mouth at bedtime. , Disp: , Rfl:    Turmeric 02-999 MG CAPS, , Disp: , Rfl:    VITAMIN D PO, Take 3 capsules by mouth daily., Disp: , Rfl:    amoxicillin-clavulanate (AUGMENTIN) 875-125 MG tablet, Take 1 tablet by mouth 2 (two) times daily. (Patient not taking: Reported on 10/30/2024), Disp: , Rfl:    atomoxetine (STRATTERA) 25 MG capsule, Take 25 mg by mouth 2 (two) times daily. (Patient not taking: Reported on 10/30/2024), Disp: , Rfl:    azithromycin  (ZITHROMAX ) 250 MG tablet, Take 1 tablet (250 mg total) by mouth daily. Take first 2 tablets together, then 1 every day until finished. (Patient not taking: Reported on 10/30/2024), Disp: 6 tablet, Rfl: 0   benzonatate  (TESSALON ) 100 MG capsule, Take 1 capsule (100 mg  total) by mouth every 8 (eight) hours. (Patient not taking: Reported on 10/30/2024), Disp: 21 capsule, Rfl: 0   celecoxib  (CELEBREX ) 200 MG capsule, Take 200 mg by mouth as needed. (Patient not taking: Reported on 08/30/2024), Disp: , Rfl:    clonazePAM  (KLONOPIN ) 0.5 MG tablet, Take 0.5 mg by mouth 2 (two) times daily as needed for anxiety. (Patient not taking: Reported on 08/30/2024), Disp: , Rfl:    Cyanocobalamin  (VITAMIN B-12) 5000 MCG TBDP, Take 5,000 mcg by mouth daily. (Patient not taking: Reported on 08/30/2024), Disp: , Rfl:    desvenlafaxine  (PRISTIQ ) 100 MG 24 hr tablet, Take 1 tablet (100 mg total) by mouth daily for anxiety or depression., Disp: 90 tablet, Rfl: 0   desvenlafaxine  (PRISTIQ ) 100 MG 24 hr tablet, Take 1 tablet(s) by mouth daily for anxiety/depression, Disp: 90 tablet, Rfl: 0   desvenlafaxine  (PRISTIQ ) 50 MG 24 hr tablet, Take 1 tablet(s) by mouth daily for anxiety/depression, Disp: 90 tablet, Rfl: 0   Docusate Calcium (STOOL SOFTENER PO), Take 1 capsule by mouth daily. *takes  stool softener daily, takes additional docusate as needed, Disp: , Rfl:    doxycycline (VIBRAMYCIN) 100 MG capsule, Take 100 mg by mouth 2 (two) times daily. (Patient not taking: Reported on 08/30/2024), Disp: , Rfl:    hydrOXYzine  (ATARAX ) 10 MG tablet, Take 10 mg by mouth 3 (three) times daily as needed. (Patient not taking: Reported on 08/30/2024), Disp: , Rfl:    lisdexamfetamine  (VYVANSE ) 30 MG capsule, Take 1 capsule(s) by mouth every morning for ADHD (Patient not taking: Reported on 08/30/2024), Disp: 30 capsule, Rfl: 0   lisdexamfetamine  (VYVANSE ) 30 MG capsule, Take 1 capsule (30 mg total) by mouth in the morning. (Patient not taking: Reported on 08/30/2024), Disp: 30 capsule, Rfl: 0   lisdexamfetamine  (VYVANSE ) 40 MG capsule, Take 1 capsule (40 mg total) by mouth every morning., Disp: 30 capsule, Rfl: 0   metaxalone (SKELAXIN) 800 MG tablet, Take 1 tablet by mouth 3 (three) times daily as needed., Disp: , Rfl:    methylcellulose (CITRUCEL) oral powder, Take 1 packet by mouth daily. (Patient not taking: Reported on 08/30/2024), Disp: , Rfl:    naproxen  (NAPROSYN ) 500 MG tablet, Take 1 tablet (500 mg total) by mouth 2 (two) times daily. (Patient not taking: Reported on 08/30/2024), Disp: 30 tablet, Rfl: 0   ondansetron  (ZOFRAN ) 8 MG tablet, Take 1 tablet (8 mg total) by mouth daily as needed for nausea., Disp: 20 tablet, Rfl: 0   ondansetron  (ZOFRAN -ODT) 8 MG disintegrating tablet, Take 8 mg by mouth every 8 (eight) hours as needed., Disp: , Rfl:    oxyCODONE  (ROXICODONE ) 5 MG immediate release tablet, Take 1 tablet (5 mg total) by mouth every 4 (four) hours as needed for severe pain. (Patient not taking: Reported on 08/30/2024), Disp: 15 tablet, Rfl: 0   pilocarpine (SALAGEN) 5 MG tablet, Take by mouth., Disp: , Rfl:    traZODone  (DESYREL ) 100 MG tablet, Take 2 tablet(s) by mouth at bedtime as needed for sleep, Disp: 180 tablet, Rfl: 0  Current Facility-Administered Medications:    0.9 %  sodium  chloride infusion, 500 mL, Intravenous, Once, [name], Aloha Raddle., MD [2]  Allergies Allergen Reactions   Atrovent [Ipratropium] Other (See Comments)    Uncontrollable nosebleeds (nasal spray)   Morphine  And Codeine Itching and Other (See Comments)    Ineffective/itching   "

## 2024-10-30 NOTE — Progress Notes (Signed)
 Transferred to PACU via stretcher.  Not responding to stimulation at this time.  VSS upon leaving procedure room.

## 2024-10-30 NOTE — Progress Notes (Signed)
 Called to room to assist during endoscopic procedure.  Patient ID and intended procedure confirmed with present staff. Received instructions for my participation in the procedure from the performing physician.

## 2024-10-30 NOTE — Op Note (Addendum)
 [facility] Patient Name: Lindsay Giles Procedure Date: 10/30/2024 3:30 PM MRN: [id number] Endoscopist: Aloha Finner , MD, [id number] Age: 49 Referring MD:  Date of Birth: [birth year] Gender: Female Account #: [id number] Procedure:                Upper GI endoscopy Indications:              Generalized abdominal pain, Heartburn, Non-erosive                            esophageal reflux, Esophageal reflux symptoms that                            persist despite appropriate therapy Medicines:                Monitored Anesthesia Care Procedure:                Pre-Anesthesia Assessment:                           - Prior to the procedure, a History and Physical                            was performed, and patient medications and                            allergies were reviewed. The patient's tolerance of                            previous anesthesia was also reviewed. The risks                            and benefits of the procedure and the sedation                            options and risks were discussed with the patient.                            All questions were answered, and informed consent                            was obtained. Prior Anticoagulants: The patient has                            taken no anticoagulant or antiplatelet agents. ASA                            Grade Assessment: II - A patient with mild systemic                            disease. After reviewing the risks and benefits,                            the patient was deemed in satisfactory condition to  undergo the procedure.                           After obtaining informed consent, the endoscope was                            passed under direct vision. Throughout the                            procedure, the patient's blood pressure, pulse, and                            oxygen saturations were monitored continuously. The                            Endoscope  was introduced through the mouth, and                            advanced to the duodenal bulb. The upper GI                            endoscopy was accomplished without difficulty. The                            patient tolerated the procedure. Scope In: Scope Out: Findings:                 No gross lesions were noted in the entire                            esophagus. A guidewire was placed and the scope was                            withdrawn. Dilation was performed with a Savary                            dilator with no resistance at 18 mm. The dilation                            site was examined following endoscope reinsertion                            and showed no change.                           The Z-line was irregular and was found 36 cm from                            the incisors.                           A 2 cm hiatal hernia was present.                           Patchy mildly erythematous mucosa without bleeding  was found in the gastric antrum.                           No other gross lesions were noted in the entire                            examined stomach. Biopsies were taken with a cold                            forceps for histology and Helicobacter pylori                            testing.                           No gross lesions were noted in the duodenal bulb,                            in the first portion of the duodenum and in the                            second portion of the duodenum. Complications:            No immediate complications. Estimated Blood Loss:     Estimated blood loss was minimal. Impression:               - No gross lesions in the entire esophagus. Dilated                            to 18 mm Savary.                           - Z-line irregular, 36 cm from the incisors.                           - 2 cm hiatal hernia.                           - Erythematous mucosa in the antrum. No other gross                             lesions in the entire stomach. Biopsied.                           - No gross lesions in the duodenal bulb, in the                            first portion of the duodenum and in the second                            portion of the duodenum. Recommendation:           - The patient will be observed post-procedure,  until all discharge criteria are met.                           - Discharge patient to home.                           - Patient has a contact number available for                            emergencies. The signs and symptoms of potential                            delayed complications were discussed with the                            patient. Return to normal activities tomorrow.                            Written discharge instructions were provided to the                            patient.                           - Dilation diet as per protocol.                           - Please use Cepacol or Halls Lozenges +/-                            Chloraseptic spray for next 72-96 hours to aid in                            sore thoat should you experience this.                           - Attempt to get Voquenza 10 mg daily for NERD. She                            has failed Protonix  and Dexilant  in regards to                            symptom managment while also following lifestyle                            modifications. If not able then I want to try                            Aciphex will see what happens with prior                            authorization.                           - Will discuss  with patient consideration of                            referral for Fundoplication +/- HH repair vs TIF                            evaluation pending ability to get Voquenza. We may                            need to consider pH impedence in the future.                           - RUQUS to rule out cholelithiasis.                            - If negative gastric biospies and negative RUQUS,                            then consider anti-spasmodic as well for abdominal                            pain.                           - Zofran  4 mg Q8H PRN (30/1) to be sent.                           - The findings and recommendations were discussed                            with the patient.                           - The findings and recommendations were discussed                            with the patient's family. Aloha Finner, MD 10/30/2024 3:56:53 PM

## 2024-10-30 NOTE — Patient Instructions (Addendum)
 POST DILATION DIET- See handout Continue present medications Await pathology results SEE RECOMMENDATIONS FROM PROCEDURE REPORT FOR FURTHER FINDINGS/INFORMATION  Handouts/information given for Hiatal Hernia and Post Dilation diet  YOU HAD AN ENDOSCOPIC PROCEDURE TODAY AT THE [facility]:   Refer to the procedure report that was given to you for any specific questions about what was found during the examination.  If the procedure report does not answer your questions, please call your gastroenterologist to clarify.  If you requested that your care partner not be given the details of your procedure findings, then the procedure report has been included in a sealed envelope for you to review at your convenience later.  YOU SHOULD EXPECT: Some feelings of bloating in the abdomen. Passage of more gas than usual.  Walking can help get rid of the air that was put into your GI tract during the procedure and reduce the bloating. If you had a lower endoscopy (such as a colonoscopy or flexible sigmoidoscopy) you may notice spotting of blood in your stool or on the toilet paper. If you underwent a bowel prep for your procedure, you may not have a normal bowel movement for a few days.  Please Note:  You might notice some irritation and congestion in your nose or some drainage.  This is from the oxygen used during your procedure.  There is no need for concern and it should clear up in a day or so.  SYMPTOMS TO REPORT IMMEDIATELY:  Following upper endoscopy (EGD)  Vomiting of blood or coffee ground material  New chest pain or pain under the shoulder blades  Painful or persistently difficult swallowing  New shortness of breath  Fever of 100F or higher  Black, tarry-looking stools For urgent or emergent issues, a gastroenterologist can be reached at any hour by calling [phone]. Do not use MyChart messaging for urgent concerns.   DIET:  POST DILATION DIET--SEE HANDOUT.  TOMORROW, YOU MAY  PROCEED your regular diet.  Drink plenty of fluids but you should avoid alcoholic beverages for 24 hours.  ACTIVITY:  You should plan to take it easy for the rest of today and you should NOT DRIVE or use heavy machinery until tomorrow (because of the sedation medicines used during the test).    FOLLOW UP: Our staff will call the number listed on your records the next business day following your procedure.  We will call around 7:15- 8:00 am to check on you and address any questions or concerns that you may have regarding the information given to you following your procedure. If we do not reach you, we will leave a message.     If any biopsies were taken you will be contacted by phone or by letter within the next 1-3 weeks.  Please call us  at [phone] if you have not heard about the biopsies in 3 weeks.    SIGNATURES/CONFIDENTIALITY: You and/or your care partner have signed paperwork which will be entered into your electronic medical record.  These signatures attest to the fact that that the information above on your After Visit Summary has been reviewed and is understood.  Full responsibility of the confidentiality of this discharge information lies with you and/or your care-partner.

## 2024-10-30 NOTE — Telephone Encounter (Signed)
 US  order entered and sent to the schedulers

## 2024-10-30 NOTE — Progress Notes (Signed)
 Pt's states no medical or surgical changes since previsit or office visit.

## 2024-10-31 ENCOUNTER — Telehealth: Payer: Self-pay

## 2024-10-31 NOTE — Telephone Encounter (Signed)
 Attempted f/u call. No answer, left VM.

## 2024-11-02 LAB — SURGICAL PATHOLOGY

## 2024-11-03 ENCOUNTER — Ambulatory Visit: Payer: Self-pay | Admitting: Gastroenterology

## 2024-11-09 ENCOUNTER — Other Ambulatory Visit: Payer: Self-pay | Admitting: Obstetrics and Gynecology

## 2024-11-09 DIAGNOSIS — N644 Mastodynia: Secondary | ICD-10-CM

## 2024-11-10 ENCOUNTER — Ambulatory Visit (HOSPITAL_COMMUNITY)
Admission: RE | Admit: 2024-11-10 | Discharge: 2024-11-10 | Disposition: A | Source: Ambulatory Visit | Attending: Gastroenterology | Admitting: Gastroenterology

## 2024-11-10 DIAGNOSIS — R109 Unspecified abdominal pain: Secondary | ICD-10-CM | POA: Insufficient documentation

## 2024-11-15 ENCOUNTER — Other Ambulatory Visit: Payer: Self-pay | Admitting: Obstetrics and Gynecology

## 2024-11-15 ENCOUNTER — Ambulatory Visit
Admission: RE | Admit: 2024-11-15 | Discharge: 2024-11-15 | Disposition: A | Discharge status: Home / Self Care | Source: Ambulatory Visit | Attending: Obstetrics and Gynecology | Admitting: Obstetrics and Gynecology

## 2024-11-15 ENCOUNTER — Other Ambulatory Visit (HOSPITAL_COMMUNITY): Payer: Self-pay

## 2024-11-15 DIAGNOSIS — N644 Mastodynia: Secondary | ICD-10-CM

## 2024-11-15 DIAGNOSIS — N6320 Unspecified lump in the left breast, unspecified quadrant: Secondary | ICD-10-CM

## 2024-11-16 ENCOUNTER — Ambulatory Visit: Payer: Self-pay | Admitting: Gastroenterology

## 2024-11-19 ENCOUNTER — Other Ambulatory Visit (HOSPITAL_COMMUNITY): Payer: Self-pay

## 2024-11-19 ENCOUNTER — Telehealth: Payer: Self-pay

## 2024-11-19 NOTE — Telephone Encounter (Signed)
 Pharmacy Patient Advocate Encounter   Received notification from CoverMyMeds that prior authorization for Voquezna  10MG  tablets is required/requested.   Insurance verification completed.   The patient is insured through [facility].   Per test claim: PA required; PA submitted to above mentioned insurance via Latent Key/confirmation #/EOC [id number] Status is pending

## 2024-11-23 NOTE — Telephone Encounter (Signed)
Dr [name] please advise  

## 2024-11-23 NOTE — Telephone Encounter (Signed)
 Pharmacy Patient Advocate Encounter  Received notification from [facility] that Prior Authorization for Voquezna  10MG  tablets  has been DENIED.  Full denial letter will be uploaded to the media tab. See denial reason below.  This medication is not on the formulary and is covered for members when:  - The member must try and fail (did not work) or be unable to take ALL formulary alternatives (due to interactions, side effects, etc).  In this case, other formulary alternatives are available for the member to take. The member has tried: generic pantoprazole  and generic dexlansoprazole .   Alternative medications include: generic rabeprazole   PA #/Case ID/Reference #: [id number]

## 2024-11-24 NOTE — Telephone Encounter (Signed)
 Okay to trial rabeprazole  since it seems like it is an alternative medication. 20 mg twice daily. Thanks. GM

## 2024-11-27 MED ORDER — RABEPRAZOLE SODIUM 20 MG PO TBEC: 20.0000 mg | DELAYED_RELEASE_TABLET | Freq: Two times a day (BID) | ORAL | 3 refills | Status: AC

## 2024-11-27 NOTE — Addendum Note (Signed)
 Addended by: ANITRA ODETTA CROME on: 11/27/2024 01:15 PM   Modules accepted: Orders

## 2024-11-27 NOTE — Telephone Encounter (Signed)
Prescription has been sent as ordered

## 2025-01-15 ENCOUNTER — Ambulatory Visit: Admitting: Gastroenterology
